# Patient Record
Sex: Female | Born: 1960
Health system: Southern US, Community
[De-identification: ages and names within clinical notes are randomized; demographics above are authoritative.]

## PROBLEM LIST (undated history)

## (undated) DIAGNOSIS — R350 Frequency of micturition: Secondary | ICD-10-CM

## (undated) DIAGNOSIS — Z9889 Other specified postprocedural states: Secondary | ICD-10-CM

## (undated) DIAGNOSIS — R112 Nausea with vomiting, unspecified: Secondary | ICD-10-CM

## (undated) DIAGNOSIS — G43909 Migraine, unspecified, not intractable, without status migrainosus: Secondary | ICD-10-CM

## (undated) DIAGNOSIS — N951 Menopausal and female climacteric states: Secondary | ICD-10-CM

## (undated) DIAGNOSIS — G4730 Sleep apnea, unspecified: Secondary | ICD-10-CM

## (undated) DIAGNOSIS — D649 Anemia, unspecified: Secondary | ICD-10-CM

## (undated) DIAGNOSIS — K8020 Calculus of gallbladder without cholecystitis without obstruction: Secondary | ICD-10-CM

## (undated) DIAGNOSIS — B019 Varicella without complication: Secondary | ICD-10-CM

## (undated) DIAGNOSIS — M1990 Unspecified osteoarthritis, unspecified site: Secondary | ICD-10-CM

## (undated) DIAGNOSIS — K219 Gastro-esophageal reflux disease without esophagitis: Secondary | ICD-10-CM

## (undated) DIAGNOSIS — R51 Headache: Secondary | ICD-10-CM

## (undated) DIAGNOSIS — T7840XA Allergy, unspecified, initial encounter: Secondary | ICD-10-CM

## (undated) DIAGNOSIS — E119 Type 2 diabetes mellitus without complications: Secondary | ICD-10-CM

## (undated) DIAGNOSIS — M797 Fibromyalgia: Secondary | ICD-10-CM

## (undated) DIAGNOSIS — Z7251 High risk heterosexual behavior: Secondary | ICD-10-CM

## (undated) DIAGNOSIS — G5600 Carpal tunnel syndrome, unspecified upper limb: Secondary | ICD-10-CM

## (undated) DIAGNOSIS — J3089 Other allergic rhinitis: Secondary | ICD-10-CM

## (undated) DIAGNOSIS — D219 Benign neoplasm of connective and other soft tissue, unspecified: Secondary | ICD-10-CM

## (undated) HISTORY — PX: TUBAL LIGATION: SHX77

## (undated) HISTORY — DX: Calculus of gallbladder without cholecystitis without obstruction: K80.20

## (undated) HISTORY — DX: High risk heterosexual behavior: Z72.51

## (undated) HISTORY — DX: Benign neoplasm of connective and other soft tissue, unspecified: D21.9

## (undated) HISTORY — DX: Menopausal and female climacteric states: N95.1

## (undated) HISTORY — DX: Carpal tunnel syndrome, unspecified upper limb: G56.00

## (undated) HISTORY — DX: Other allergic rhinitis: J30.89

## (undated) HISTORY — DX: Varicella without complication: B01.9

## (undated) HISTORY — DX: Allergy, unspecified, initial encounter: T78.40XA

## (undated) HISTORY — DX: Type 2 diabetes mellitus without complications: E11.9

## (undated) HISTORY — DX: Anemia, unspecified: D64.9

## (undated) HISTORY — DX: Fibromyalgia: M79.7

## (undated) HISTORY — PX: WISDOM TOOTH EXTRACTION: SHX21

## (undated) HISTORY — PX: LASER ABLATION CONDYLOMA CERVICAL / VULVAR: SUR819

---

## 1997-06-19 ENCOUNTER — Ambulatory Visit (HOSPITAL_COMMUNITY): Admission: RE | Admit: 1997-06-19 | Discharge: 1997-06-19 | Payer: Self-pay | Admitting: *Deleted

## 1997-09-19 ENCOUNTER — Emergency Department (HOSPITAL_COMMUNITY): Admission: EM | Admit: 1997-09-19 | Discharge: 1997-09-19 | Payer: Self-pay | Admitting: Emergency Medicine

## 1997-12-06 ENCOUNTER — Other Ambulatory Visit: Admission: RE | Admit: 1997-12-06 | Discharge: 1997-12-06 | Payer: Self-pay | Admitting: Obstetrics and Gynecology

## 1997-12-14 ENCOUNTER — Emergency Department (HOSPITAL_COMMUNITY): Admission: EM | Admit: 1997-12-14 | Discharge: 1997-12-14 | Payer: Self-pay | Admitting: Emergency Medicine

## 1998-01-22 ENCOUNTER — Emergency Department (HOSPITAL_COMMUNITY): Admission: EM | Admit: 1998-01-22 | Discharge: 1998-01-22 | Payer: Self-pay | Admitting: Emergency Medicine

## 1998-06-08 ENCOUNTER — Emergency Department (HOSPITAL_COMMUNITY): Admission: EM | Admit: 1998-06-08 | Discharge: 1998-06-08 | Payer: Self-pay

## 1998-09-27 ENCOUNTER — Ambulatory Visit (HOSPITAL_COMMUNITY): Admission: RE | Admit: 1998-09-27 | Discharge: 1998-09-27 | Payer: Self-pay | Admitting: *Deleted

## 1998-10-23 ENCOUNTER — Ambulatory Visit (HOSPITAL_COMMUNITY): Admission: RE | Admit: 1998-10-23 | Discharge: 1998-10-23 | Payer: Self-pay | Admitting: Obstetrics

## 1998-11-29 ENCOUNTER — Emergency Department (HOSPITAL_COMMUNITY): Admission: EM | Admit: 1998-11-29 | Discharge: 1998-11-29 | Payer: Self-pay | Admitting: Internal Medicine

## 1998-11-29 ENCOUNTER — Encounter: Payer: Self-pay | Admitting: Internal Medicine

## 1998-12-14 ENCOUNTER — Emergency Department (HOSPITAL_COMMUNITY): Admission: EM | Admit: 1998-12-14 | Discharge: 1998-12-14 | Payer: Self-pay | Admitting: *Deleted

## 1999-02-16 ENCOUNTER — Emergency Department (HOSPITAL_COMMUNITY): Admission: EM | Admit: 1999-02-16 | Discharge: 1999-02-16 | Payer: Self-pay | Admitting: Emergency Medicine

## 1999-02-16 ENCOUNTER — Encounter: Payer: Self-pay | Admitting: Emergency Medicine

## 1999-04-18 ENCOUNTER — Emergency Department (HOSPITAL_COMMUNITY): Admission: EM | Admit: 1999-04-18 | Discharge: 1999-04-18 | Payer: Self-pay | Admitting: Emergency Medicine

## 1999-05-18 ENCOUNTER — Emergency Department (HOSPITAL_COMMUNITY): Admission: EM | Admit: 1999-05-18 | Discharge: 1999-05-18 | Payer: Self-pay

## 1999-07-22 ENCOUNTER — Emergency Department (HOSPITAL_COMMUNITY): Admission: EM | Admit: 1999-07-22 | Discharge: 1999-07-23 | Payer: Self-pay | Admitting: Emergency Medicine

## 1999-07-30 ENCOUNTER — Other Ambulatory Visit: Admission: RE | Admit: 1999-07-30 | Discharge: 1999-07-30 | Payer: Self-pay | Admitting: Obstetrics

## 1999-11-09 ENCOUNTER — Emergency Department (HOSPITAL_COMMUNITY): Admission: EM | Admit: 1999-11-09 | Discharge: 1999-11-09 | Payer: Self-pay | Admitting: Emergency Medicine

## 1999-12-24 ENCOUNTER — Emergency Department (HOSPITAL_COMMUNITY): Admission: EM | Admit: 1999-12-24 | Discharge: 1999-12-24 | Payer: Self-pay | Admitting: Emergency Medicine

## 2000-01-17 ENCOUNTER — Ambulatory Visit (HOSPITAL_COMMUNITY): Admission: RE | Admit: 2000-01-17 | Discharge: 2000-01-17 | Payer: Self-pay | Admitting: Neurology

## 2000-01-17 ENCOUNTER — Encounter: Payer: Self-pay | Admitting: Neurology

## 2000-04-02 ENCOUNTER — Encounter: Payer: Self-pay | Admitting: Emergency Medicine

## 2000-04-02 ENCOUNTER — Emergency Department (HOSPITAL_COMMUNITY): Admission: EM | Admit: 2000-04-02 | Discharge: 2000-04-02 | Payer: Self-pay | Admitting: Emergency Medicine

## 2000-09-13 ENCOUNTER — Emergency Department (HOSPITAL_COMMUNITY): Admission: EM | Admit: 2000-09-13 | Discharge: 2000-09-13 | Payer: Self-pay | Admitting: Emergency Medicine

## 2000-09-14 ENCOUNTER — Encounter: Payer: Self-pay | Admitting: Emergency Medicine

## 2000-09-14 ENCOUNTER — Emergency Department (HOSPITAL_COMMUNITY): Admission: EM | Admit: 2000-09-14 | Discharge: 2000-09-14 | Payer: Self-pay | Admitting: Emergency Medicine

## 2000-11-30 ENCOUNTER — Encounter: Payer: Self-pay | Admitting: Internal Medicine

## 2000-11-30 ENCOUNTER — Encounter: Admission: RE | Admit: 2000-11-30 | Discharge: 2000-11-30 | Payer: Self-pay | Admitting: Internal Medicine

## 2001-06-09 ENCOUNTER — Encounter: Admission: RE | Admit: 2001-06-09 | Discharge: 2001-06-09 | Payer: Self-pay | Admitting: Internal Medicine

## 2001-06-09 ENCOUNTER — Encounter: Payer: Self-pay | Admitting: Internal Medicine

## 2001-07-07 ENCOUNTER — Encounter: Payer: Self-pay | Admitting: Orthopedic Surgery

## 2001-07-07 ENCOUNTER — Encounter: Admission: RE | Admit: 2001-07-07 | Discharge: 2001-07-07 | Payer: Self-pay | Admitting: Orthopedic Surgery

## 2001-07-21 ENCOUNTER — Encounter: Admission: RE | Admit: 2001-07-21 | Discharge: 2001-07-21 | Payer: Self-pay | Admitting: Orthopedic Surgery

## 2001-07-21 ENCOUNTER — Encounter: Payer: Self-pay | Admitting: Orthopedic Surgery

## 2001-12-16 ENCOUNTER — Encounter: Admission: RE | Admit: 2001-12-16 | Discharge: 2001-12-16 | Payer: Self-pay | Admitting: Internal Medicine

## 2001-12-16 ENCOUNTER — Encounter: Payer: Self-pay | Admitting: Internal Medicine

## 2002-02-13 ENCOUNTER — Other Ambulatory Visit: Admission: RE | Admit: 2002-02-13 | Discharge: 2002-02-13 | Payer: Self-pay | Admitting: Obstetrics and Gynecology

## 2002-02-25 ENCOUNTER — Emergency Department (HOSPITAL_COMMUNITY): Admission: EM | Admit: 2002-02-25 | Discharge: 2002-02-25 | Payer: Self-pay | Admitting: Emergency Medicine

## 2002-02-25 ENCOUNTER — Encounter: Payer: Self-pay | Admitting: Emergency Medicine

## 2002-09-21 ENCOUNTER — Ambulatory Visit (HOSPITAL_COMMUNITY): Admission: RE | Admit: 2002-09-21 | Discharge: 2002-09-21 | Payer: Self-pay | Admitting: Internal Medicine

## 2002-12-21 ENCOUNTER — Encounter: Payer: Self-pay | Admitting: Internal Medicine

## 2002-12-21 ENCOUNTER — Encounter: Admission: RE | Admit: 2002-12-21 | Discharge: 2002-12-21 | Payer: Self-pay | Admitting: Internal Medicine

## 2003-02-21 ENCOUNTER — Emergency Department (HOSPITAL_COMMUNITY): Admission: EM | Admit: 2003-02-21 | Discharge: 2003-02-21 | Payer: Self-pay | Admitting: Emergency Medicine

## 2003-12-24 ENCOUNTER — Ambulatory Visit (HOSPITAL_COMMUNITY): Admission: RE | Admit: 2003-12-24 | Discharge: 2003-12-24 | Payer: Self-pay | Admitting: Internal Medicine

## 2004-02-02 ENCOUNTER — Emergency Department (HOSPITAL_COMMUNITY): Admission: EM | Admit: 2004-02-02 | Discharge: 2004-02-02 | Payer: Self-pay | Admitting: Emergency Medicine

## 2004-02-05 ENCOUNTER — Encounter (INDEPENDENT_AMBULATORY_CARE_PROVIDER_SITE_OTHER): Payer: Self-pay | Admitting: Specialist

## 2004-02-05 ENCOUNTER — Ambulatory Visit (HOSPITAL_COMMUNITY): Admission: RE | Admit: 2004-02-05 | Discharge: 2004-02-05 | Payer: Self-pay | Admitting: Obstetrics

## 2005-05-04 ENCOUNTER — Ambulatory Visit (HOSPITAL_COMMUNITY): Admission: RE | Admit: 2005-05-04 | Discharge: 2005-05-04 | Payer: Self-pay | Admitting: Internal Medicine

## 2006-04-13 HISTORY — PX: ABDOMINAL HYSTERECTOMY: SHX81

## 2006-04-13 HISTORY — PX: LAPAROSCOPIC HYSTERECTOMY: SHX1926

## 2006-06-02 ENCOUNTER — Ambulatory Visit (HOSPITAL_COMMUNITY): Admission: RE | Admit: 2006-06-02 | Discharge: 2006-06-02 | Payer: Self-pay | Admitting: Obstetrics

## 2006-10-13 ENCOUNTER — Encounter (INDEPENDENT_AMBULATORY_CARE_PROVIDER_SITE_OTHER): Payer: Self-pay | Admitting: Obstetrics

## 2006-10-13 ENCOUNTER — Inpatient Hospital Stay (HOSPITAL_COMMUNITY): Admission: RE | Admit: 2006-10-13 | Discharge: 2006-10-15 | Payer: Self-pay | Admitting: Obstetrics

## 2006-11-01 ENCOUNTER — Ambulatory Visit (HOSPITAL_COMMUNITY): Admission: RE | Admit: 2006-11-01 | Discharge: 2006-11-01 | Payer: Self-pay | Admitting: Obstetrics

## 2006-11-03 ENCOUNTER — Ambulatory Visit (HOSPITAL_COMMUNITY): Admission: RE | Admit: 2006-11-03 | Discharge: 2006-11-03 | Payer: Self-pay | Admitting: Obstetrics

## 2007-07-04 ENCOUNTER — Emergency Department (HOSPITAL_COMMUNITY): Admission: EM | Admit: 2007-07-04 | Discharge: 2007-07-04 | Payer: Self-pay | Admitting: Emergency Medicine

## 2008-03-04 ENCOUNTER — Emergency Department: Payer: Self-pay | Admitting: Emergency Medicine

## 2008-04-24 ENCOUNTER — Ambulatory Visit (HOSPITAL_COMMUNITY): Admission: RE | Admit: 2008-04-24 | Discharge: 2008-04-24 | Payer: Self-pay | Admitting: Internal Medicine

## 2008-08-30 ENCOUNTER — Emergency Department (HOSPITAL_COMMUNITY): Admission: EM | Admit: 2008-08-30 | Discharge: 2008-08-30 | Payer: Self-pay | Admitting: Emergency Medicine

## 2009-02-10 ENCOUNTER — Encounter: Admission: RE | Admit: 2009-02-10 | Discharge: 2009-02-10 | Payer: Self-pay | Admitting: Internal Medicine

## 2009-04-25 ENCOUNTER — Ambulatory Visit (HOSPITAL_COMMUNITY): Admission: RE | Admit: 2009-04-25 | Discharge: 2009-04-25 | Payer: Self-pay | Admitting: Internal Medicine

## 2009-08-22 ENCOUNTER — Encounter: Admission: RE | Admit: 2009-08-22 | Discharge: 2009-08-22 | Payer: Self-pay | Admitting: Specialist

## 2009-10-16 ENCOUNTER — Emergency Department (HOSPITAL_COMMUNITY): Admission: EM | Admit: 2009-10-16 | Discharge: 2009-10-16 | Payer: Self-pay | Admitting: Emergency Medicine

## 2009-12-30 ENCOUNTER — Emergency Department (HOSPITAL_COMMUNITY): Admission: EM | Admit: 2009-12-30 | Discharge: 2009-12-30 | Payer: Self-pay | Admitting: Emergency Medicine

## 2010-05-03 ENCOUNTER — Encounter: Payer: Self-pay | Admitting: Internal Medicine

## 2010-05-04 ENCOUNTER — Encounter: Payer: Self-pay | Admitting: Obstetrics

## 2010-05-20 ENCOUNTER — Other Ambulatory Visit (HOSPITAL_COMMUNITY): Payer: Self-pay | Admitting: Obstetrics

## 2010-05-20 DIAGNOSIS — Z1231 Encounter for screening mammogram for malignant neoplasm of breast: Secondary | ICD-10-CM

## 2010-05-29 ENCOUNTER — Ambulatory Visit (HOSPITAL_COMMUNITY)
Admission: RE | Admit: 2010-05-29 | Discharge: 2010-05-29 | Disposition: A | Discharge status: Home / Self Care | Payer: Federal, State, Local not specified - PPO | Source: Ambulatory Visit | Attending: Obstetrics | Admitting: Obstetrics

## 2010-05-29 DIAGNOSIS — Z1231 Encounter for screening mammogram for malignant neoplasm of breast: Secondary | ICD-10-CM | POA: Insufficient documentation

## 2010-06-02 ENCOUNTER — Other Ambulatory Visit: Payer: Self-pay | Admitting: Obstetrics

## 2010-06-02 DIAGNOSIS — R928 Other abnormal and inconclusive findings on diagnostic imaging of breast: Secondary | ICD-10-CM

## 2010-06-06 ENCOUNTER — Ambulatory Visit
Admission: RE | Admit: 2010-06-06 | Discharge: 2010-06-06 | Disposition: A | Discharge status: Home / Self Care | Payer: Federal, State, Local not specified - PPO | Source: Ambulatory Visit | Attending: Obstetrics | Admitting: Obstetrics

## 2010-06-06 DIAGNOSIS — R928 Other abnormal and inconclusive findings on diagnostic imaging of breast: Secondary | ICD-10-CM

## 2010-06-29 LAB — POCT I-STAT, CHEM 8
BUN: 9 mg/dL (ref 6–23)
Calcium, Ion: 1.07 mmol/L — ABNORMAL LOW (ref 1.12–1.32)
Chloride: 104 mEq/L (ref 96–112)
Glucose, Bld: 109 mg/dL — ABNORMAL HIGH (ref 70–99)
Hemoglobin: 14.6 g/dL (ref 12.0–15.0)
Sodium: 137 mEq/L (ref 135–145)

## 2010-07-22 LAB — URINALYSIS, ROUTINE W REFLEX MICROSCOPIC
Glucose, UA: NEGATIVE mg/dL
Ketones, ur: NEGATIVE mg/dL
Nitrite: NEGATIVE
Urobilinogen, UA: 1 mg/dL (ref 0.0–1.0)

## 2010-07-22 LAB — DIFFERENTIAL
Lymphocytes Relative: 33 % (ref 12–46)
Lymphs Abs: 1.6 10*3/uL (ref 0.7–4.0)
Neutro Abs: 2.9 10*3/uL (ref 1.7–7.7)
Neutrophils Relative %: 59 % (ref 43–77)

## 2010-07-22 LAB — COMPREHENSIVE METABOLIC PANEL
ALT: 13 U/L (ref 0–35)
AST: 16 U/L (ref 0–37)
Alkaline Phosphatase: 41 U/L (ref 39–117)
CO2: 23 mEq/L (ref 19–32)
Calcium: 8.5 mg/dL (ref 8.4–10.5)
Potassium: 4 mEq/L (ref 3.5–5.1)
Sodium: 134 mEq/L — ABNORMAL LOW (ref 135–145)
Total Bilirubin: 0.5 mg/dL (ref 0.3–1.2)

## 2010-07-22 LAB — CBC
HCT: 37.7 % (ref 36.0–46.0)
Hemoglobin: 12.6 g/dL (ref 12.0–15.0)
MCV: 81.2 fL (ref 78.0–100.0)

## 2010-07-22 LAB — URINE MICROSCOPIC-ADD ON

## 2010-07-22 LAB — WET PREP, GENITAL: Yeast Wet Prep HPF POC: NONE SEEN

## 2010-07-22 LAB — LIPASE, BLOOD: Lipase: 24 U/L (ref 11–59)

## 2010-08-26 NOTE — Op Note (Signed)
Natalie Vazquez, Natalie Vazquez                 ACCOUNT NO.:  [id number]   MEDICAL RECORD NO.:  [id number]          PATIENT TYPE:  INP   LOCATION:  9304                          FACILITY:  [facility]   PHYSICIAN:  Kathreen Cosier, M.D.DATE OF BIRTH:  April 22, 1960   DATE OF PROCEDURE:  10/13/2006  DATE OF DISCHARGE:                               OPERATIVE REPORT   PREOPERATIVE DIAGNOSES:  Intramural myoma, adenomyosis.   POSTOPERATIVE DIAGNOSES:  Adenomyosis.   SURGEON:  Kathreen Cosier, M.D.   FIRST ASSISTANT:  Charles A. Clearance Coots, M.D.   The patient was placed on the operating table in supine position,  general anesthesia administered, abdomen prepped and draped, bladder  emptied with Foley catheter.  Transverse suprapubic incision made,  carried down to the rectus fascia, fascia cleaned and incised the length  of the incision.  Rectus muscles were retracted laterally.  Peritoneum  incised longitudinally.  Uterus was enlarged and boggy.  The ovaries  were normal.  She had a previous tubal ligation.  The right round  ligament was grasped with a Kelly clamp, cut, suture ligated with #1  chromic.  Procedure was done in a similar fashion on the other side.  The Metzenbaum scissors were used to dissect the bladder off the uterus  with sharp dissection, then the right utero-ovarian ligament grasped,  cut, suture ligated with #1 chromic.  Procedure done in similar fashion  on the other side.  Uterine vessels were skeletonized bilaterally,  double clamped with Heaney clamps, cut, suture ligated times two on the  right.  Procedure done in a similar fashion on the other side.  Uterosacral and cardinal ligaments grasped on the right, cut, suture  ligated with #1 chromic.  Procedure done in similar fashion on the other  side.  Using the Mayo scissors, the uterus was removed at the  cervicovaginal junction.   Modified Richardson sutures were placed in the angle of the vagina and  then the vaginal vault  run with interlocking suture of #1 chromic.  Hemostasis was satisfactory.  The operative site was reperitonealized  with 2-0 chromic. The remnant of the left tube was grasped with a Kelly,  cut, suture ligated with #1 chromic.  Procedure done in similar fashion  on the other side.  Hemostasis satisfactory.  Lap and sponge counts  correct.  Blood loss 150 mL.   Abdomen closed in layers.  Peritoneum continuous suture of 0 chromic,  fascia continuous suture of 0 Dexon, skin closed with subcuticular  stitch of 4-0 Monocryl.   The patient tolerated the procedure well, taken to recovery room in good  condition.           ______________________________  Kathreen Cosier, M.D.     BAM/MEDQ  D:  10/13/2006  T:  10/13/2006  Job:  [id number]

## 2010-08-29 NOTE — Op Note (Signed)
   [name], [name]                               ACCOUNT NO.:  [id number]   MEDICAL RECORD NO.:  [id number]                   PATIENT TYPE:  AMB   LOCATION:  ENDO                                 FACILITY:  [facility]   PHYSICIAN:  Georgiana Spinner, M.D.                 DATE OF BIRTH:  May 19, 1960   DATE OF PROCEDURE:  09/21/2002  DATE OF DISCHARGE:                                 OPERATIVE REPORT   PROCEDURE:  Colonoscopy.   ENDOSCOPIST:  Georgiana Spinner, M.D.   INDICATIONS:  Rectal bleeding.   ANESTHESIA:  Demerol 50 mg, Versed 2.5 mg.   DESCRIPTION OF PROCEDURE:  With the patient mildly sedated in the left  lateral decubitus position, the Olympus videoscopic colonoscope was inserted  in the rectum and passed under direct vision to the cecum identified by  ileocecal valve and appendiceal orifice both of which were photographed.  From this point, the colonoscope was slowly withdrawn taken circumferential  views of the entire colonic mucosa stopping only in the rectum which  appeared normal on direct view and retroflexed view.  The endoscope was  straightened and withdrawn.  The patient's vital signs and pulse oximeter  remained stable.  The patient tolerated the procedure well without apparent  complications.   FINDINGS:  Unremarkable colonoscopic examination.   PLAN:  See endoscopy note for further details.                                               Georgiana Spinner, M.D.    GMO/MEDQ  D:  09/21/2002  T:  09/21/2002  Job:  [id number]   cc:   Olene Craven, M.D.  [address]  [address]  [place]  [place] [address]  Fax: [phone]

## 2010-08-29 NOTE — Op Note (Signed)
   NAMEQUORRA, ROSENE                               ACCOUNT NO.:  [id number]   MEDICAL RECORD NO.:  [id number]                   PATIENT TYPE:  AMB   LOCATION:  ENDO                                 FACILITY:  [facility]   PHYSICIAN:  Georgiana Spinner, M.D.                 DATE OF BIRTH:  1960-10-20   DATE OF PROCEDURE:  09/21/2002  DATE OF DISCHARGE:                                 OPERATIVE REPORT   PROCEDURE PERFORMED:  Upper endoscopy with Savary dilation.   ENDOSCOPIST:  Georgiana Spinner, M.D.   ANESTHESIA:  Demerol 50 mg, Versed 7.5 mg.   DESCRIPTION OF PROCEDURE:  With the patient mildly sedated in the left  lateral decubitus position, the Olympus video endoscope was inserted in the  mouth and passed under direct vision through the esophagus which appeared  normal into the stomach.  The fundus, body, antrum, duodenal bulb and second  portion of the duodenum all appeared normal.  From this point, the endoscope  was slowly withdrawn taking circumferential views of the entire duodenal  mucosa until the endoscope was pulled back into the stomach and placed on  retroflexion to view the stomach from below.  The endoscope was then  straightened and a guidewire was passed.  The endoscope was withdrawn and  over the guidewire was passed the 16 and 18 Savary dilators.  There was  minimal resistance with the latter.  The guidewire was removed.  The  endoscope was reinserted and the esophagus and stomach appeared normal other  than some small bleeding noted at the dilation site.  The endoscope was  withdrawn.  The patient's vital signs and pulse oximeter remained stable.  The patient tolerated the procedure well without apparent complications.   FINDINGS:  Unremarkable examination with esophagus dilated to 16 and 18  Savary dilators.   PLAN:  Will await clinical response.  Will have the patient follow up with  me as an outpatient.  Proceed to colonoscopy as planned.                     Georgiana Spinner, M.D.    GMO/MEDQ  D:  09/21/2002  T:  09/21/2002  Job:  [id number]   cc:   Olene Craven, M.D.  [address]  [address]  [place]  [place] [address]  Fax: [phone]

## 2010-08-29 NOTE — Discharge Summary (Signed)
[name], [name]                 ACCOUNT NO.:  [id number]   MEDICAL RECORD NO.:  [id number]          PATIENT TYPE:  INP   LOCATION:  9304                          [facility]ACILITY[facility]   PHYSICIAN:  Kathreen Cosier, M.D.DATE OF BIRTH:  05-19-1960   DATE OF ADMISSION:  10/13/2006  DATE OF DISCHARGE:  10/15/2006                               DISCHARGE SUMMARY   Patient is a 50 year old gravida 5, para 5 with history of dysfunctional  bleeding who had NovaSure ablation in 2005.  She still had heavy periods  and severe pain, and was admitted for TAH.  She underwent a TAH on 07/02  and did well postoperatively.  Her hemoglobin was 11.1, preoperatively  11.3, white count 6.3, 10.2.  PT/PTT normal.  Sodium 138, potassium 4,  chloride 107, glucose 107.  Urine negative.  She was discharged home on  the second postoperative day, ambulatory on a regular diet, on Tylox, to  see me in 4 weeks.   DISCHARGE DIAGNOSIS:  Status post total abdominal hysterectomy because  of chronic pelvic pain.           ______________________________  Kathreen Cosier, M.D.     BAM/MEDQ  D:  12/16/2006  T:  12/16/2006  Job:  [id number]

## 2010-08-29 NOTE — Op Note (Signed)
NAME:  Natalie Vazquez, Natalie Vazquez                   ACCOUNT NO.:  [id number]   MEDICAL RECORD NO.:  [id number]          PATIENT TYPE:  AMB   LOCATION:  SDC                           [facility]ILITY:  [facility]   PHYSICIAN:  Kathreen Cosier, M.D.DATE OF BIRTH:  1961-01-16   DATE OF PROCEDURE:  02/05/2004  DATE OF DISCHARGE:                                 OPERATIVE REPORT   PREOPERATIVE DIAGNOSES:  Dysfunctional uterine bleeding.   POSTOPERATIVE DIAGNOSES:  Dysfunctional uterine bleeding.   PROCEDURE:  Diagnostic hysteroscopy, sharp curettage and Novasure ablation  in the endometrial cavity.   Under general anesthesia, the patient in the lithotomy position, perineum  and vagina prepped and draped, bladder emptied with a straight catheter.  Bimanual exam revealed the uterus to be 12 week size enlarged with myoma.  A  speculum placed in the vagina, anterior lip of the cervix grasped with a  tenaculum. The endocervix was curetted and a small amount of tissue  obtained. The endometrial cavity sounded to 11 cm and then the cervical  length was 5 cm so the cavity length was 6 cm.  The cervix dilated to a #27  Shawnie Pons, diagnostic hysteroscope inserted, cavity was normal.  Sharp curettage  was performed, a moderate amount of tissue was obtained.  An ablation device  was inserted and the cavity integrity established. The width of the cavity  was 4.5 cm.  The ablation was done at 149 watts for one minute and seven  seconds.  The __________ was removed and the hysteroscopy repeated and the  endometrial cavity totally ablated.  The hysteroscopy pump was set at 70  mmHg and fluid deficit was 40 mL.  The patient tolerated the procedure well  and was taken to the recovery room in good condition.      BAM/MEDQ  D:  02/05/2004  T:  02/05/2004  Job:  [id number]

## 2010-09-11 ENCOUNTER — Emergency Department (HOSPITAL_COMMUNITY)
Admission: EM | Admit: 2010-09-11 | Discharge: 2010-09-12 | Disposition: A | Discharge status: Home / Self Care | Payer: Federal, State, Local not specified - PPO | Attending: Emergency Medicine | Admitting: Emergency Medicine

## 2010-09-11 DIAGNOSIS — M79609 Pain in unspecified limb: Secondary | ICD-10-CM | POA: Insufficient documentation

## 2010-09-11 DIAGNOSIS — G589 Mononeuropathy, unspecified: Secondary | ICD-10-CM | POA: Insufficient documentation

## 2010-09-12 LAB — GLUCOSE, CAPILLARY: Glucose-Capillary: 97 mg/dL (ref 70–99)

## 2010-10-29 ENCOUNTER — Other Ambulatory Visit: Payer: Self-pay | Admitting: Internal Medicine

## 2010-10-29 DIAGNOSIS — N63 Unspecified lump in unspecified breast: Secondary | ICD-10-CM

## 2010-11-13 ENCOUNTER — Ambulatory Visit
Admission: RE | Admit: 2010-11-13 | Discharge: 2010-11-13 | Disposition: A | Discharge status: Home / Self Care | Payer: Federal, State, Local not specified - PPO | Source: Ambulatory Visit | Attending: Internal Medicine | Admitting: Internal Medicine

## 2010-11-13 DIAGNOSIS — N63 Unspecified lump in unspecified breast: Secondary | ICD-10-CM

## 2011-01-27 LAB — CBC
HCT: 34.1 — ABNORMAL LOW
Hemoglobin: 11.1 — ABNORMAL LOW
MCHC: 32.5
MCV: 80.5
RBC: 4.23
WBC: 10.2

## 2011-01-28 LAB — COMPREHENSIVE METABOLIC PANEL
Albumin: 3.7
BUN: 6
Calcium: 8.9
Chloride: 107
Creatinine, Ser: 0.71
Total Bilirubin: 0.5
Total Protein: 7.2

## 2011-01-28 LAB — CBC
HCT: 41.3
MCHC: 32.1
MCV: 82.2
Platelets: 234
RDW: 14.8 — ABNORMAL HIGH

## 2011-01-28 LAB — PROTIME-INR: Prothrombin Time: 13.4

## 2011-01-28 LAB — DIFFERENTIAL
Basophils Absolute: 0.1
Eosinophils Absolute: 0.1
Eosinophils Relative: 2

## 2011-03-23 ENCOUNTER — Emergency Department (HOSPITAL_COMMUNITY)
Admission: EM | Admit: 2011-03-23 | Discharge: 2011-03-24 | Disposition: A | Discharge status: Home / Self Care | Payer: Federal, State, Local not specified - PPO | Attending: Emergency Medicine | Admitting: Emergency Medicine

## 2011-03-23 ENCOUNTER — Encounter (HOSPITAL_COMMUNITY): Payer: Self-pay | Admitting: *Deleted

## 2011-03-23 DIAGNOSIS — K8050 Calculus of bile duct without cholangitis or cholecystitis without obstruction: Secondary | ICD-10-CM

## 2011-03-23 DIAGNOSIS — K8020 Calculus of gallbladder without cholecystitis without obstruction: Secondary | ICD-10-CM | POA: Insufficient documentation

## 2011-03-23 DIAGNOSIS — R10819 Abdominal tenderness, unspecified site: Secondary | ICD-10-CM | POA: Insufficient documentation

## 2011-03-23 DIAGNOSIS — R1011 Right upper quadrant pain: Secondary | ICD-10-CM | POA: Insufficient documentation

## 2011-03-23 HISTORY — DX: Unspecified osteoarthritis, unspecified site: M19.90

## 2011-03-24 ENCOUNTER — Emergency Department (HOSPITAL_COMMUNITY): Payer: Federal, State, Local not specified - PPO

## 2011-03-24 LAB — URINALYSIS, ROUTINE W REFLEX MICROSCOPIC
Ketones, ur: NEGATIVE mg/dL
Leukocytes, UA: NEGATIVE
Nitrite: NEGATIVE
Protein, ur: 30 mg/dL — AB
Urobilinogen, UA: 1 mg/dL (ref 0.0–1.0)
pH: 7 (ref 5.0–8.0)

## 2011-03-24 LAB — DIFFERENTIAL
Basophils Relative: 0 % (ref 0–1)
Eosinophils Absolute: 0.1 10*3/uL (ref 0.0–0.7)
Monocytes Absolute: 0.4 10*3/uL (ref 0.1–1.0)
Monocytes Relative: 6 % (ref 3–12)
Neutro Abs: 2.5 10*3/uL (ref 1.7–7.7)

## 2011-03-24 LAB — COMPREHENSIVE METABOLIC PANEL
Albumin: 3.6 g/dL (ref 3.5–5.2)
BUN: 8 mg/dL (ref 6–23)
Chloride: 102 mEq/L (ref 96–112)
Creatinine, Ser: 0.71 mg/dL (ref 0.50–1.10)
GFR calc Af Amer: >90 mL/min (ref >90)
GFR calc non Af Amer: >90 mL/min (ref >90)
Glucose, Bld: 112 mg/dL — ABNORMAL HIGH (ref 70–99)
Total Bilirubin: 0.2 mg/dL — ABNORMAL LOW (ref 0.3–1.2)

## 2011-03-24 LAB — LIPASE, BLOOD: Lipase: 44 U/L (ref 11–59)

## 2011-03-24 LAB — CBC
HCT: 39.1 % (ref 36.0–46.0)
Hemoglobin: 13.1 g/dL (ref 12.0–15.0)
MCH: 26.3 pg (ref 26.0–34.0)
MCHC: 33.5 g/dL (ref 30.0–36.0)

## 2011-03-24 MED ORDER — HYDROCODONE-ACETAMINOPHEN 5-500 MG PO TABS: 1.0000 | ORAL_TABLET | Freq: Four times a day (QID) | ORAL | Status: AC | PRN

## 2011-03-24 MED ORDER — ONDANSETRON HCL 4 MG/2ML IJ SOLN
4.0000 mg | Freq: Once | INTRAMUSCULAR | Status: AC
  Administered 2011-03-24: 4 mg via INTRAVENOUS
  Filled 2011-03-24: qty 2

## 2011-03-24 MED ORDER — SODIUM CHLORIDE 0.9 % IV SOLN
Freq: Once | INTRAVENOUS | Status: AC
  Administered 2011-03-24: 01:00:00 via INTRAVENOUS

## 2011-03-24 MED ORDER — MORPHINE SULFATE 4 MG/ML IJ SOLN: 4.0000 mg | Freq: Once | INTRAMUSCULAR | Status: DC

## 2011-03-24 MED ORDER — ONDANSETRON HCL 4 MG PO TABS: 4.0000 mg | ORAL_TABLET | Freq: Four times a day (QID) | ORAL | Status: AC

## 2011-03-24 MED ORDER — MORPHINE SULFATE 2 MG/ML IJ SOLN
INTRAMUSCULAR | Status: AC
  Administered 2011-03-24: 4 mg via INTRAMUSCULAR
  Filled 2011-03-24: qty 2

## 2011-03-24 NOTE — ED Notes (Signed)
Pt out of room in [place]

## 2011-03-24 NOTE — ED Provider Notes (Addendum)
History     CSN: [id number] Arrival date & time: 03/23/2011 11:13 PM   First MD Initiated Contact with Patient 03/24/11 0014      Chief Complaint  Patient presents with  . Flank Pain    pt c/o right side flank and back pain that began around 10pm. pt denies hx of kidney stones. pt c/o nausea denies vomiting.     (Consider location/radiation/quality/duration/timing/severity/associated sxs/prior treatment) HPI Comments: Approximately 2 hours after eating a ham and cheese sandwich developed right upper quadrant pain radiating to the right flank with some nausea.  No vomiting.  Has never had any pain like this before has no history of gallbladder disease has new history of kidney stones.  Denies dysuria constipation or  The history is provided by the patient.    Past Medical History  Diagnosis Date  . Arthritis     Past Surgical History  Procedure Date  . Tubal ligation   . Abdominal hysterectomy     partial    History reviewed. No pertinent family history.  History  Substance Use Topics  . Smoking status: Never Smoker   . Smokeless tobacco: Not on file  . Alcohol Use: Yes    OB History    Grav Para Term Preterm Abortions TAB SAB Ect Mult Living                  Review of Systems  Constitutional: Negative.   HENT: Negative.   Eyes: Negative.   Respiratory: Negative.   Cardiovascular: Negative.   Gastrointestinal: Positive for abdominal pain. Negative for nausea, vomiting, diarrhea, constipation and abdominal distention.  Genitourinary: Negative for dysuria, urgency and frequency.  Musculoskeletal: Negative.   Skin: Negative.   Neurological: Negative.   Hematological: Negative.   Psychiatric/Behavioral: Negative.     Allergies  Iodine; Latex; and Shellfish allergy  Home Medications   Current Outpatient Rx  Name Route Sig Dispense Refill  . CETIRIZINE HCL 10 MG PO TABS Oral Take 10 mg by mouth daily.      Marland Kitchen ESOMEPRAZOLE MAGNESIUM 40 MG PO CPDR Oral  Take 40 mg by mouth daily before breakfast.      . HYDROCODONE-ACETAMINOPHEN 5-500 MG PO TABS Oral Take 1-2 tablets by mouth every 6 (six) hours as needed for pain (as needed for pain ). 10 tablet 0  . ONDANSETRON HCL 4 MG PO TABS Oral Take 1 tablet (4 mg total) by mouth every 6 (six) hours. 12 tablet 0    BP 136/87  Pulse 58  Temp(Src) 97.8 F (36.6 C) (Oral)  Resp 20  SpO2 99%  Physical Exam  Constitutional: She appears well-developed.  HENT:  Head: Normocephalic.  Eyes: Pupils are equal, round, and reactive to light.  Neck: Normal range of motion.  Cardiovascular: Normal rate.   Pulmonary/Chest: Effort normal.  Abdominal: She exhibits no distension. There is tenderness. There is no rebound and no guarding.  Musculoskeletal: Normal range of motion.    ED Course  Procedures (including critical care time)  Labs Reviewed  DIFFERENTIAL - Abnormal; Notable for the following:    Neutrophils Relative 33 (*)    Lymphocytes Relative 59 (*)    Lymphs Abs 4.3 (*)    All other components within normal limits  COMPREHENSIVE METABOLIC PANEL - Abnormal; Notable for the following:    Glucose, Bld 112 (*)    AST 39 (*)    Total Bilirubin 0.2 (*)    All other components within normal limits  URINALYSIS, ROUTINE W  REFLEX MICROSCOPIC - Abnormal; Notable for the following:    Color, Urine STRAW (*)    Protein, ur 30 (*)    All other components within normal limits  URINE MICROSCOPIC-ADD ON - Abnormal; Notable for the following:    Bacteria, UA MANY (*)    All other components within normal limits  CBC  LIPASE, BLOOD   US Abdomen Complete  03/24/2011  *RADIOLOGY REPORT*  Clinical Data:  Right upper quadrant pain  COMPLETE ABDOMINAL ULTRASOUND  Comparison:  CT 08/30/2008 and ultrasound 11/03/2006  Findings:  Gallbladder:  9 mm mobile stone in the gallbladder.  No gallbladder wall thickening or pericholecystic edema.  Murphy's sign was negative.  Normal caliber,  Common bile duct:  Normal  caliber, measuring about 4 mm diameter.  Liver:  No focal lesion identified.  Within normal limits in parenchymal echogenicity.  IVC:  Appears normal.  Pancreas:  Limited visualization of the pancreas due to overlying bowel gas.  Spleen:  Spleen length measures 6.7 cm.  Normal homogeneous parenchymal echotexture.  Right Kidney:  Right kidney measures 12.4 cm length.  No hydronephrosis.  Left Kidney:  Left kidney measures 12.2 cm length.  No hydronephrosis.  Abdominal aorta:  Normal caliber.  IMPRESSION: Cholelithiasis with mobile gallstone.  Original Report Authenticated By: Marlon Pel, M.D.     1. Biliary colic       MDM  Kidney stone verses gall bladder disease         Arman Filter, NP 03/24/11 7829  Arman Filter, NP 03/24/11 0518  Arman Filter, NP 03/25/11 0425

## 2011-03-24 NOTE — ED Provider Notes (Signed)
Medical screening examination/treatment/procedure(s) were performed by non-physician practitioner and as supervising physician I was immediately available for consultation/collaboration.   Lyanne Co, MD 03/24/11 2230

## 2011-03-25 NOTE — ED Provider Notes (Signed)
Medical screening examination/treatment/procedure(s) were performed by non-physician practitioner and as supervising physician I was immediately available for consultation/collaboration.   Lyanne Co, MD 03/25/11 [phone]

## 2011-04-15 ENCOUNTER — Ambulatory Visit (INDEPENDENT_AMBULATORY_CARE_PROVIDER_SITE_OTHER): Payer: Self-pay | Admitting: General Surgery

## 2011-04-16 ENCOUNTER — Encounter (INDEPENDENT_AMBULATORY_CARE_PROVIDER_SITE_OTHER): Payer: Self-pay | Admitting: General Surgery

## 2011-04-16 ENCOUNTER — Ambulatory Visit (INDEPENDENT_AMBULATORY_CARE_PROVIDER_SITE_OTHER): Payer: Federal, State, Local not specified - PPO | Admitting: General Surgery

## 2011-04-16 VITALS — BP 140/87 | HR 70 | Temp 97.0°F | Resp 18 | Ht 67.0 in | Wt 233.6 lb

## 2011-04-16 DIAGNOSIS — K8020 Calculus of gallbladder without cholecystitis without obstruction: Secondary | ICD-10-CM

## 2011-04-16 NOTE — Progress Notes (Signed)
Addended by: Liz Malady on: 04/16/2011 03:53 PM   Modules accepted: Orders

## 2011-04-16 NOTE — Progress Notes (Signed)
Patient ID: Natalie Vazquez, female   DOB: 09-27-1960, 51 y.o.   MRN: [id number]  Chief Complaint  Patient presents with  . Abdominal Pain    Gallstone    HPI Natalie Vazquez is a 51 y.o. female.   HPIPatient has been having episodic right upper quadrant abdominal pain that extends around to her back. She has not noticed any association with eating. She was seen in the emergency department on December 11. At this time ultrasound showed multiple mobile gallstones. She did not have significant elevation of liver function tests. She did not have acute cholecystitis. Since that time, she has been watching what she eats. However, she has been having attacks every few days. She also gets some back spasms associated with these attacks. Dr. Ethelene Browns referred her for consultation.  Past Medical History  Diagnosis Date  . Arthritis     Past Surgical History  Procedure Date  . Tubal ligation   . Abdominal hysterectomy     partial  Esophageal dilatation for stricture  No family history on file.  Social History History  Substance Use Topics  . Smoking status: Never Smoker   . Smokeless tobacco: Not on file  . Alcohol Use: Yes    Allergies  Allergen Reactions  . Iodine   . Latex   . Shellfish Allergy     Current Outpatient Prescriptions  Medication Sig Dispense Refill  . cetirizine (ZYRTEC) 10 MG tablet Take 10 mg by mouth daily.        Marland Kitchen esomeprazole (NEXIUM) 40 MG capsule Take 40 mg by mouth daily before breakfast.        . ZONISAMIDE PO Take by mouth.          Review of Systems Review of Systems  Constitutional: Negative for fever, chills and unexpected weight change.  HENT: Negative for hearing loss, congestion, sore throat, trouble swallowing and voice change.   Eyes: Negative for visual disturbance.  Respiratory: Negative for cough and wheezing.   Cardiovascular: Negative for chest pain, palpitations and leg swelling.  Gastrointestinal: Positive for abdominal pain. Negative  for nausea, vomiting, diarrhea, constipation, blood in stool, abdominal distention and anal bleeding.       See history of present illness   Genitourinary: Negative for hematuria, vaginal bleeding and difficulty urinating.  Musculoskeletal: Negative for arthralgias.  Skin: Negative for rash and wound.  Neurological: Negative for seizures, syncope and headaches.  Hematological: Negative for adenopathy. Does not bruise/bleed easily.  Psychiatric/Behavioral: Negative for confusion.    Blood pressure 140/87, pulse 70, temperature 97 F (36.1 C), temperature source Temporal, resp. rate 18, height 5\' 7"  (1.702 m), weight 233 lb 9.6 oz (105.96 kg).  Physical Exam Physical Exam  Constitutional: She is oriented to person, place, and time. She appears well-developed and well-nourished.  HENT:  Head: Normocephalic and atraumatic.  Eyes: Conjunctivae and EOM are normal. Pupils are equal, round, and reactive to light.  Neck: Normal range of motion. Neck supple. No tracheal deviation present.  Cardiovascular: Normal rate, regular rhythm and normal heart sounds.   Pulmonary/Chest: Effort normal. No respiratory distress. She has no wheezes. She has no rales.  Abdominal: Soft. Bowel sounds are normal. She exhibits no distension. There is tenderness. There is no rebound and no guarding.       Tenderness is intermittent and very mild in the right upper quadrant  Musculoskeletal: Normal range of motion.  Neurological: She is alert and oriented to person, place, and time.  Skin: Skin is  warm and dry. No erythema.    Data Reviewed U/S and labs  Assessment    Symptomatic cholelithiasis    Plan    I have offered laparoscopic cholecystectomy with cholangiogram. I discussed the procedure in detail.  The patient was given Agricultural engineer.  We discussed the risks and benefits of a laparoscopic cholecystectomy and possible cholangiogram including, but not limited to bleeding, infection, injury to  surrounding structures such as the intestine or liver, bile leak, retained gallstones, need to convert to an open procedure, prolonged diarrhea, blood clots such as  DVT, common bile duct injury, anesthesia risks, and possible need for additional procedures.  The likelihood of improvement in symptoms and return to the patient's normal status is good. We discussed the typical post-operative recovery course.        Devaunte Gasparini E 04/16/2011, 3:44 PM

## 2011-04-20 ENCOUNTER — Other Ambulatory Visit: Payer: Self-pay | Admitting: Obstetrics

## 2011-04-20 ENCOUNTER — Encounter (INDEPENDENT_AMBULATORY_CARE_PROVIDER_SITE_OTHER): Payer: Self-pay | Admitting: General Surgery

## 2011-04-20 DIAGNOSIS — Z1231 Encounter for screening mammogram for malignant neoplasm of breast: Secondary | ICD-10-CM

## 2011-05-07 ENCOUNTER — Encounter (HOSPITAL_COMMUNITY): Payer: Self-pay | Admitting: Pharmacy Technician

## 2011-05-12 ENCOUNTER — Encounter (HOSPITAL_COMMUNITY)
Admission: RE | Admit: 2011-05-12 | Discharge: 2011-05-12 | Disposition: A | Discharge status: Home / Self Care | Payer: Federal, State, Local not specified - PPO | Source: Ambulatory Visit | Attending: General Surgery | Admitting: General Surgery

## 2011-05-12 ENCOUNTER — Encounter (HOSPITAL_COMMUNITY): Payer: Self-pay

## 2011-05-12 ENCOUNTER — Other Ambulatory Visit (HOSPITAL_COMMUNITY): Payer: Self-pay | Admitting: *Deleted

## 2011-05-12 HISTORY — DX: Headache: R51

## 2011-05-12 HISTORY — DX: Gastro-esophageal reflux disease without esophagitis: K21.9

## 2011-05-12 HISTORY — DX: Other specified postprocedural states: R11.2

## 2011-05-12 HISTORY — DX: Frequency of micturition: R35.0

## 2011-05-12 HISTORY — DX: Other specified postprocedural states: Z98.890

## 2011-05-12 HISTORY — DX: Nausea with vomiting, unspecified: R11.2

## 2011-05-12 LAB — CBC
HCT: 38.1 % (ref 36.0–46.0)
Hemoglobin: 12.6 g/dL (ref 12.0–15.0)
MCV: 79.2 fL (ref 78.0–100.0)
Platelets: 226 10*3/uL (ref 150–400)
RBC: 4.81 MIL/uL (ref 3.87–5.11)
WBC: 7.4 10*3/uL (ref 4.0–10.5)

## 2011-05-12 NOTE — Pre-Procedure Instructions (Signed)
20 Natalie Vazquez  05/12/2011   Your procedure is scheduled on: 05-19-2011   @10 :00 AM  Report to [facility] at 8:00AM.  Call this number if you have problems the morning of surgery: [phone]   Remember:   Do not eat food:After Midnight.  May have clear liquids: up to 4 Hours before arrival.  Clear liquids include soda, tea, black coffee, apple or grape juice, broth.until 4:00 AM  Take these medicines the morning of surgery with A SIP OF WATER:zyrtec, flexeril if needed, nexium,zonegran if needed   Do not wear jewelry, make-up or nail polish.  Do not wear lotions, powders, or perfumes. You may wear deodorant.  Do not shave 48 hours prior to surgery.  Do not bring valuables to the hospital.  Contacts, dentures or bridgework may not be worn into surgery.  Leave suitcase in the car. After surgery it may be brought to your room.    Patients discharged the day of surgery will not be allowed to drive home.  Name and phone number of your driver ALVIN  Special Instructions: CHG Shower Use Special Wash: 1/2 bottle night before surgery and 1/2 bottle morning of surgery.   Please read over the following fact sheets that you were given: Pain Booklet, Coughing and Deep Breathing, MRSA Information and Surgical Site Infection Prevention

## 2011-05-12 NOTE — Pre-Procedure Instructions (Signed)
20 STARLYN DROGE  05/12/2011   Your procedure is scheduled on: 05-19-2011 @  12noon  Report to [facility] at 10:00 AM.  Call this number if you have problems the morning of surgery: [phone]   Remember:   Do not eat food:After Midnight.  May have clear liquids: up to 4 Hours before arrival.  Clear liquids include soda, tea, black coffee, apple or grape juice, broth. Until 6:00 AM  Take these medicines the morning of surgery with A SIP OF WATER zyrtec, flexeril if needed,nexium, zonegran   Do not wear jewelry, make-up or nail polish.  Do not wear lotions, powders, or perfumes. You may wear deodorant.  Do not shave 48 hours prior to surgery.  Do not bring valuables to the hospital.  Contacts, dentures or bridgework may not be worn into surgery.  Leave suitcase in the car. After surgery it may be brought to your room.  For patients admitted to the hospital, checkout time is 11:00 AM the day of discharge.   Patients discharged the day of surgery will not be allowed to drive home.  Name and phone number of your driver:  Special Instructions: CHG Shower Use Special Wash: 1/2 bottle night before surgery and 1/2 bottle morning of surgery.   Please read over the following fact sheets that you were given: Pain Booklet, Coughing and Deep Breathing, MRSA Information and Surgical Site Infection Prevention

## 2011-05-18 MED ORDER — CEFAZOLIN SODIUM-DEXTROSE 2-3 GM-% IV SOLR
2.0000 g | INTRAVENOUS | Status: AC
  Administered 2011-05-19: 2 g via INTRAVENOUS
  Filled 2011-05-18: qty 50

## 2011-05-19 ENCOUNTER — Encounter (HOSPITAL_COMMUNITY)
Admission: RE | Disposition: A | Discharge status: Home / Self Care | Payer: Self-pay | Source: Ambulatory Visit | Attending: General Surgery

## 2011-05-19 ENCOUNTER — Ambulatory Visit (HOSPITAL_COMMUNITY)
Admission: RE | Admit: 2011-05-19 | Discharge: 2011-05-19 | Disposition: A | Discharge status: Home / Self Care | Payer: Federal, State, Local not specified - PPO | Source: Ambulatory Visit | Attending: General Surgery | Admitting: General Surgery

## 2011-05-19 ENCOUNTER — Encounter (HOSPITAL_COMMUNITY): Payer: Self-pay | Admitting: Anesthesiology

## 2011-05-19 ENCOUNTER — Encounter (HOSPITAL_COMMUNITY): Payer: Self-pay | Admitting: *Deleted

## 2011-05-19 ENCOUNTER — Other Ambulatory Visit (INDEPENDENT_AMBULATORY_CARE_PROVIDER_SITE_OTHER): Payer: Self-pay | Admitting: General Surgery

## 2011-05-19 ENCOUNTER — Ambulatory Visit (HOSPITAL_COMMUNITY): Payer: Federal, State, Local not specified - PPO | Admitting: Anesthesiology

## 2011-05-19 ENCOUNTER — Telehealth (INDEPENDENT_AMBULATORY_CARE_PROVIDER_SITE_OTHER): Payer: Self-pay | Admitting: General Surgery

## 2011-05-19 DIAGNOSIS — K8010 Calculus of gallbladder with chronic cholecystitis without obstruction: Secondary | ICD-10-CM

## 2011-05-19 DIAGNOSIS — Z01812 Encounter for preprocedural laboratory examination: Secondary | ICD-10-CM | POA: Insufficient documentation

## 2011-05-19 DIAGNOSIS — Z9049 Acquired absence of other specified parts of digestive tract: Secondary | ICD-10-CM

## 2011-05-19 DIAGNOSIS — K219 Gastro-esophageal reflux disease without esophagitis: Secondary | ICD-10-CM | POA: Insufficient documentation

## 2011-05-19 DIAGNOSIS — J45909 Unspecified asthma, uncomplicated: Secondary | ICD-10-CM | POA: Insufficient documentation

## 2011-05-19 HISTORY — PX: CHOLECYSTECTOMY: SHX55

## 2011-05-19 SURGERY — LAPAROSCOPIC CHOLECYSTECTOMY
Anesthesia: General | Site: Abdomen | Wound class: Contaminated

## 2011-05-19 MED ORDER — MIDAZOLAM HCL 5 MG/5ML IJ SOLN
INTRAMUSCULAR | Status: DC | PRN
  Administered 2011-05-19: 2 mg via INTRAVENOUS

## 2011-05-19 MED ORDER — PROPOFOL 10 MG/ML IV EMUL
INTRAVENOUS | Status: DC | PRN
  Administered 2011-05-19: 190 mg via INTRAVENOUS

## 2011-05-19 MED ORDER — 0.9 % SODIUM CHLORIDE (POUR BTL) OPTIME
TOPICAL | Status: DC | PRN
  Administered 2011-05-19: 1000 mL

## 2011-05-19 MED ORDER — DROPERIDOL 2.5 MG/ML IJ SOLN
INTRAMUSCULAR | Status: DC | PRN
  Administered 2011-05-19: 0.625 mg via INTRAVENOUS

## 2011-05-19 MED ORDER — GLYCOPYRROLATE 0.2 MG/ML IJ SOLN
INTRAMUSCULAR | Status: DC | PRN
  Administered 2011-05-19: .4 mg via INTRAVENOUS

## 2011-05-19 MED ORDER — LACTATED RINGERS IV SOLN
INTRAVENOUS | Status: DC
  Administered 2011-05-19 (×2): via INTRAVENOUS

## 2011-05-19 MED ORDER — DEXAMETHASONE SODIUM PHOSPHATE 4 MG/ML IJ SOLN
INTRAMUSCULAR | Status: DC | PRN
  Administered 2011-05-19: 8 mg via INTRAVENOUS

## 2011-05-19 MED ORDER — HYDROMORPHONE HCL PF 1 MG/ML IJ SOLN
0.2500 mg | INTRAMUSCULAR | Status: DC | PRN
Start: 2011-05-19 — End: 2011-05-19
  Administered 2011-05-19: 0.5 mg via INTRAVENOUS
  Administered 2011-05-19 (×2): 0.25 mg via INTRAVENOUS

## 2011-05-19 MED ORDER — OXYCODONE-ACETAMINOPHEN 5-325 MG PO TABS: 2.0000 | ORAL_TABLET | Freq: Four times a day (QID) | ORAL | Status: AC | PRN

## 2011-05-19 MED ORDER — SODIUM CHLORIDE 0.9 % IR SOLN
Status: DC | PRN
  Administered 2011-05-19: 1

## 2011-05-19 MED ORDER — MEPERIDINE HCL 25 MG/ML IJ SOLN: 6.2500 mg | INTRAMUSCULAR | Status: DC | PRN

## 2011-05-19 MED ORDER — PROMETHAZINE HCL 25 MG/ML IJ SOLN: 6.2500 mg | INTRAMUSCULAR | Status: DC | PRN

## 2011-05-19 MED ORDER — ROCURONIUM BROMIDE 100 MG/10ML IV SOLN
INTRAVENOUS | Status: DC | PRN
  Administered 2011-05-19: 50 mg via INTRAVENOUS

## 2011-05-19 MED ORDER — BUPIVACAINE-EPINEPHRINE 0.25% -1:200000 IJ SOLN
INTRAMUSCULAR | Status: DC | PRN
  Administered 2011-05-19: 12 mL

## 2011-05-19 MED ORDER — FENTANYL CITRATE 0.05 MG/ML IJ SOLN
INTRAMUSCULAR | Status: DC | PRN
  Administered 2011-05-19 (×3): 100 ug via INTRAVENOUS
  Administered 2011-05-19: 50 ug via INTRAVENOUS

## 2011-05-19 MED ORDER — NEOSTIGMINE METHYLSULFATE 1 MG/ML IJ SOLN
INTRAMUSCULAR | Status: DC | PRN
  Administered 2011-05-19: 3 mg via INTRAVENOUS

## 2011-05-19 MED ORDER — ONDANSETRON HCL 4 MG/2ML IJ SOLN
INTRAMUSCULAR | Status: DC | PRN
  Administered 2011-05-19 (×2): 4 mg via INTRAVENOUS

## 2011-05-19 SURGICAL SUPPLY — 53 items
ADH SKN CLS APL DERMABOND .7 (GAUZE/BANDAGES/DRESSINGS)
APPLIER CLIP 5 13 M/L LIGAMAX5 (MISCELLANEOUS) ×3
APPLIER CLIP ROT 10 11.4 M/L (STAPLE)
APR CLP MED LRG 11.4X10 (STAPLE)
APR CLP MED LRG 5 ANG JAW (MISCELLANEOUS) ×2
BAG SPEC RTRVL LRG 6X4 10 (ENDOMECHANICALS) ×2
BLADE SURG ROTATE 9660 (MISCELLANEOUS) ×2 IMPLANT
CANISTER SUCTION 2500CC (MISCELLANEOUS) ×3 IMPLANT
CHLORAPREP W/TINT 26ML (MISCELLANEOUS) ×3 IMPLANT
CLIP APPLIE 5 13 M/L LIGAMAX5 (MISCELLANEOUS) ×2 IMPLANT
CLIP APPLIE ROT 10 11.4 M/L (STAPLE) IMPLANT
CLOTH BEACON ORANGE TIMEOUT ST (SAFETY) ×3 IMPLANT
COVER MAYO STAND STRL (DRAPES) ×3 IMPLANT
COVER SURGICAL LIGHT HANDLE (MISCELLANEOUS) ×3 IMPLANT
DECANTER SPIKE VIAL GLASS SM (MISCELLANEOUS) ×2 IMPLANT
DERMABOND ADVANCED (GAUZE/BANDAGES/DRESSINGS)
DERMABOND ADVANCED .7 DNX12 (GAUZE/BANDAGES/DRESSINGS) ×1 IMPLANT
DRAPE C-ARM 42X72 X-RAY (DRAPES) ×3 IMPLANT
DRAPE UTILITY 15X26 W/TAPE STR (DRAPE) ×6 IMPLANT
ELECT REM PT RETURN 9FT ADLT (ELECTROSURGICAL) ×3
ELECTRODE REM PT RTRN 9FT ADLT (ELECTROSURGICAL) ×2 IMPLANT
FILTER SMOKE EVAC LAPAROSHD (FILTER) ×2 IMPLANT
GLOVE BIO SURGEON STRL SZ8 (GLOVE) ×1 IMPLANT
GLOVE BIOGEL PI IND STRL 7.0 (GLOVE) ×1 IMPLANT
GLOVE BIOGEL PI IND STRL 7.5 (GLOVE) ×3 IMPLANT
GLOVE BIOGEL PI IND STRL 8 (GLOVE) ×3 IMPLANT
GLOVE BIOGEL PI INDICATOR 7.0 (GLOVE) ×1
GLOVE BIOGEL PI INDICATOR 7.5 (GLOVE) ×3
GLOVE BIOGEL PI INDICATOR 8 (GLOVE) ×2
GLOVE SURG SS PI 6.5 STRL IVOR (GLOVE) ×2 IMPLANT
GLOVE SURG SS PI 7.0 STRL IVOR (GLOVE) ×2 IMPLANT
GLOVE SURG SS PI 7.5 STRL IVOR (GLOVE) ×2 IMPLANT
GLOVE SURG SS PI 8.0 STRL IVOR (GLOVE) ×4 IMPLANT
GOWN PREVENTION PLUS XLARGE (GOWN DISPOSABLE) ×5 IMPLANT
GOWN STRL NON-REIN LRG LVL3 (GOWN DISPOSABLE) ×9 IMPLANT
KIT BASIN OR (CUSTOM PROCEDURE TRAY) ×3 IMPLANT
KIT ROOM TURNOVER OR (KITS) ×3 IMPLANT
NS IRRIG 1000ML POUR BTL (IV SOLUTION) ×3 IMPLANT
PAD ARMBOARD 7.5X6 YLW CONV (MISCELLANEOUS) ×6 IMPLANT
POUCH SPECIMEN RETRIEVAL 10MM (ENDOMECHANICALS) ×3 IMPLANT
SCISSORS LAP 5X35 DISP (ENDOMECHANICALS) IMPLANT
SET CHOLANGIOGRAPH 5 50 .035 (SET/KITS/TRAYS/PACK) ×3 IMPLANT
SET IRRIG TUBING LAPAROSCOPIC (IRRIGATION / IRRIGATOR) ×3 IMPLANT
SLEEVE Z-THREAD 5X100MM (TROCAR) ×6 IMPLANT
SPECIMEN JAR SMALL (MISCELLANEOUS) ×3 IMPLANT
SUT VIC AB 4-0 PS2 27 (SUTURE) ×3 IMPLANT
TOWEL OR 17X24 6PK STRL BLUE (TOWEL DISPOSABLE) ×3 IMPLANT
TOWEL OR 17X26 10 PK STRL BLUE (TOWEL DISPOSABLE) ×3 IMPLANT
TRAY LAPAROSCOPIC (CUSTOM PROCEDURE TRAY) ×3 IMPLANT
TROCAR HASSON GELL 12X100 (TROCAR) ×3 IMPLANT
TROCAR Z-THREAD FIOS 11X100 BL (TROCAR) IMPLANT
TROCAR Z-THREAD FIOS 5X100MM (TROCAR) ×3 IMPLANT
WATER STERILE IRR 1000ML POUR (IV SOLUTION) IMPLANT

## 2011-05-19 NOTE — Transfer of Care (Signed)
Immediate Anesthesia Transfer of Care Note  Patient: Natalie Vazquez  Procedure(s) Performed:  LAPAROSCOPIC CHOLECYSTECTOMY  Patient Location: PACU  Anesthesia Type: General  Level of Consciousness: awake, alert , oriented and patient cooperative  Airway & Oxygen Therapy: Patient Spontanous Breathing and Patient connected to nasal cannula oxygen  Post-op Assessment: Report given to PACU RN, Post -op Vital signs reviewed and stable and Patient moving all extremities  Post vital signs: Reviewed and stable  Complications: No apparent anesthesia complications

## 2011-05-19 NOTE — Anesthesia Procedure Notes (Signed)
Procedure Name: Intubation Date/Time: 05/19/2011 9:58 AM Performed by: Fuller Canada Pre-anesthesia Checklist: Patient identified, Timeout performed, Emergency Drugs available, Suction available and Patient being monitored Patient Re-evaluated:Patient Re-evaluated prior to inductionOxygen Delivery Method: Circle System Utilized Preoxygenation: Pre-oxygenation with 100% oxygen Intubation Type: IV induction Ventilation: Mask ventilation without difficulty Laryngoscope Size: Mac and 3 Grade View: Grade I Tube type: Oral Tube size: 7.5 mm Number of attempts: 1 Airway Equipment and Method: stylet Placement Confirmation: ETT inserted through vocal cords under direct vision,  breath sounds checked- equal and bilateral and positive ETCO2 Secured at: 22 cm Tube secured with: Tape Dental Injury: Teeth and Oropharynx as per pre-operative assessment

## 2011-05-19 NOTE — H&P (Signed)
HPI  Meeah Totino Common is a 51 y.o. female.  HPIPatient has been having episodic right upper quadrant abdominal pain that extends around to her back. She has not noticed any association with eating. She was seen in the emergency department on December 11. At this time ultrasound showed multiple mobile gallstones. She did not have significant elevation of liver function tests. She did not have acute cholecystitis. Since that time, she has been watching what she eats. However, she has been having attacks every few days. She also gets some back spasms associated with these attacks. Dr. Ethelene Browns referred her for consultation.  Past Medical History   Diagnosis  Date   .  Arthritis     Past Surgical History   Procedure  Date   .  Tubal ligation    .  Abdominal hysterectomy      partial   Esophageal dilatation for stricture  No family history on file.  Social History  History   Substance Use Topics   .  Smoking status:  Never Smoker   .  Smokeless tobacco:  Not on file   .  Alcohol Use:  Yes    Allergies   Allergen  Reactions   .  Iodine    .  Latex    .  Shellfish Allergy     Current Outpatient Prescriptions   Medication  Sig  Dispense  Refill   .  cetirizine (ZYRTEC) 10 MG tablet  Take 10 mg by mouth daily.     Marland Kitchen  esomeprazole (NEXIUM) 40 MG capsule  Take 40 mg by mouth daily before breakfast.     .  ZONISAMIDE PO  Take by mouth.      Review of Systems  Review of Systems  Constitutional: Negative for fever, chills and unexpected weight change.  HENT: Negative for hearing loss, congestion, sore throat, trouble swallowing and voice change.  Eyes: Negative for visual disturbance.  Respiratory: Negative for cough and wheezing.  Cardiovascular: Negative for chest pain, palpitations and leg swelling.  Gastrointestinal: Positive for abdominal pain. Negative for nausea, vomiting, diarrhea, constipation, blood in stool, abdominal distention and anal bleeding.  See history of present illness   Genitourinary: Negative for hematuria, vaginal bleeding and difficulty urinating.  Musculoskeletal: Negative for arthralgias.  Skin: Negative for rash and wound.  Neurological: Negative for seizures, syncope and headaches.  Hematological: Negative for adenopathy. Does not bruise/bleed easily.  Psychiatric/Behavioral: Negative for confusion.   Blood pressure 140/87, pulse 70, temperature 97 F (36.1 C), temperature source Temporal, resp. rate 18, height 5\' 7"  (1.702 m), weight 233 lb 9.6 oz (105.96 kg).  Physical Exam  Physical Exam  Constitutional: She is oriented to person, place, and time. She appears well-developed and well-nourished.  HENT:  Head: Normocephalic and atraumatic.  Eyes: Conjunctivae and EOM are normal. Pupils are equal, round, and reactive to light.  Neck: Normal range of motion. Neck supple. No tracheal deviation present.  Cardiovascular: Normal rate, regular rhythm and normal heart sounds.  Pulmonary/Chest: Effort normal. No respiratory distress. She has no wheezes. She has no rales.  Abdominal: Soft. Bowel sounds are normal. She exhibits no distension. There is tenderness. There is no rebound and no guarding.  Tenderness is intermittent and very mild in the right upper quadrant  Musculoskeletal: Normal range of motion.  Neurological: She is alert and oriented to person, place, and time.  Skin: Skin is warm and dry. No erythema.   Data Reviewed  U/S and labs  Assessment  Symptomatic cholelithiasis   Plan   I have offered laparoscopic cholecystectomy with cholangiogram. I discussed the procedure in detail. The patient was given Agricultural engineer. We discussed the risks and benefits of a laparoscopic cholecystectomy and possible cholangiogram including, but not limited to bleeding, infection, injury to surrounding structures such as the intestine or liver, bile leak, retained gallstones, need to convert to an open procedure, prolonged diarrhea, blood clots such  as DVT, common bile duct injury, anesthesia risks, and possible need for additional procedures. The likelihood of improvement in symptoms and return to the patient's normal status is good. We discussed the typical post-operative recovery course.   Patient was re-examined in pre-op area.  No changes.  Violeta Gelinas, MD, MPH, FACS Pager: [phone]

## 2011-05-19 NOTE — Anesthesia Postprocedure Evaluation (Signed)
  Anesthesia Post-op Note  Patient: Natalie Vazquez  Procedure(s) Performed:  LAPAROSCOPIC CHOLECYSTECTOMY  Patient Location: PACU  Anesthesia Type: General  Level of Consciousness: awake  Airway and Oxygen Therapy: Patient Spontanous Breathing  Post-op Pain: mild  Post-op Assessment: Post-op Vital signs reviewed  Post-op Vital Signs: stable  Complications: No apparent anesthesia complications

## 2011-05-19 NOTE — Anesthesia Preprocedure Evaluation (Addendum)
Anesthesia Evaluation  Patient identified by MRN, date of birth, ID band Patient awake    Reviewed: Allergy & Precautions, H&P , NPO status , Patient's Chart, lab work & pertinent test results  History of Anesthesia Complications (+) PONV  Airway Mallampati: I TM Distance: >3 FB Neck ROM: Full    Dental  (+) Teeth Intact and Dental Advisory Given   Pulmonary asthma ,  clear to auscultation        Cardiovascular Regular Normal    Neuro/Psych  Headaches,    GI/Hepatic GERD-  ,  Endo/Other    Renal/GU      Musculoskeletal   Abdominal   Peds  Hematology   Anesthesia Other Findings   Reproductive/Obstetrics                           Anesthesia Physical Anesthesia Plan  ASA: II  Anesthesia Plan: General   Post-op Pain Management:    Induction: Intravenous  Airway Management Planned: Oral ETT  Additional Equipment:   Intra-op Plan:   Post-operative Plan: Extubation in OR  Informed Consent: I have reviewed the patients History and Physical, chart, labs and discussed the procedure including the risks, benefits and alternatives for the proposed anesthesia with the patient or authorized representative who has indicated his/her understanding and acceptance.   Dental advisory given  Plan Discussed with: CRNA, Anesthesiologist and Surgeon  Anesthesia Plan Comments:         Anesthesia Quick Evaluation

## 2011-05-19 NOTE — Op Note (Signed)
05/19/2011  10:54 AM  PATIENT:  Natalie Vazquez  51 y.o. female  PRE-OPERATIVE DIAGNOSIS:  symptomatic cholelithiasis  POST-OPERATIVE DIAGNOSIS:  symptomatic cholelithiasis  PROCEDURE:  Procedure(s): LAPAROSCOPIC CHOLECYSTECTOMY  SURGEON:  Surgeon(s): Liz Malady, MD  PHYSICIAN ASSISTANT: Brayton El, PAC  ASSISTANTS: none   ANESTHESIA:   general  EBL:  Total I/O In: 1200 [I.V.:1200] Out: -   BLOOD ADMINISTERED:none  DRAINS: none   SPECIMEN:  Excision  DISPOSITION OF SPECIMEN:  PATHOLOGY  COUNTS:  YES  DICTATION: Reubin Milan Dictation patient presents for cholecystectomy. She's been on the preop holding area. She received intravenous antibiotics. We obtained informed consent. She was brought to the operating room and general endotracheal anesthesia was administered by the anesthesia staff. Her abdomen was prepped and draped in sterile fashion. We did time out procedure. Area above the umbilicus was infiltrated with quarter percent Marcaine with epinephrine. Incision was made subcutaneous tissues were dissected down revealing the anterior fascia. This was divided sharply along the midline. The abdomen was entered under direct vision. A 0 Vicryl pursestring suture was placed on the fascial opening. Hassan trocar was inserted. Abdomen was insufflated with carbon dioxide in standard fashion. Laparoscopic exploration revealed filmy omental adhesions to the body of the gallbladder. A 5 mm epigastric and 25 mm right lateral ports were placed. Local was used at all port sites. Abdomen the gallbladder was retracted superior and medially. We then gradually taken down from the omental adhesions from the body of the gallbladder. There is no other adhesions from the liver medially which we took down and cauterized the area to get good hemostasis. Once infundibulum was freed up it was retracted inferior laterally. Dissection began laterally and progressed medially easily identifying the cystic  duct. We dissected until a critical view was obtained between the cystic duct the infundibulum and the liver. Patient has anaphylaxis to iodine so we did not do a cholangiogram. 3 clips were placed proximally on the cystic duct one was placed distally and it was divided. Cystic artery was dissected out and 2 clips were placed proximally and one was placed distally and it was divided. Gallbladder was taken off the liver bed with Bovie cautery. Was placed in Endo Catch bag and removed from the abdomen via the infraumbilical port site. Liver bed was copiously irrigated and hemostasis was obtained with cautery. Irrigation fluid returned clear. Was no further bleeding. Clips were in good position. Ports were removed under direct vision. Pneumoperitoneum was released. All 4 wounds were copiously irrigated. Supraumbilical fascia was closed by tying the purse and suture. The skin of each wound was closed with running 4-0 Vicryl subcuticular stitch followed by Dermabond. Sponge needle and as we counts were correct. Patient tolerated procedure well without apparent complications and was taken recovery room in stable condition.  PATIENT DISPOSITION:  PACU - hemodynamically stable.   Delay start of Pharmacological VTE agent (>24hrs) due to surgical blood loss or risk of bleeding:  no  Violeta Gelinas, MD, MPH, FACS Pager: [phone]  2/5/201310:54 AM

## 2011-05-19 NOTE — Telephone Encounter (Signed)
I called patient with an appointment for 2/20.  I had to work her in for the postop visit.

## 2011-05-22 ENCOUNTER — Encounter (HOSPITAL_COMMUNITY): Payer: Self-pay | Admitting: General Surgery

## 2011-06-03 ENCOUNTER — Encounter (INDEPENDENT_AMBULATORY_CARE_PROVIDER_SITE_OTHER): Payer: Self-pay

## 2011-06-03 ENCOUNTER — Ambulatory Visit (INDEPENDENT_AMBULATORY_CARE_PROVIDER_SITE_OTHER): Payer: Federal, State, Local not specified - PPO | Admitting: General Surgery

## 2011-06-03 ENCOUNTER — Encounter (INDEPENDENT_AMBULATORY_CARE_PROVIDER_SITE_OTHER): Payer: Self-pay | Admitting: General Surgery

## 2011-06-03 VITALS — BP 130/90 | HR 83 | Temp 97.4°F | Ht 67.0 in | Wt 236.6 lb

## 2011-06-03 DIAGNOSIS — Z9889 Other specified postprocedural states: Secondary | ICD-10-CM

## 2011-06-03 DIAGNOSIS — Z9049 Acquired absence of other specified parts of digestive tract: Secondary | ICD-10-CM

## 2011-06-03 NOTE — Progress Notes (Signed)
Subjective:     Patient ID: Natalie Vazquez, female   DOB: Oct 17, 1960, 51 y.o.   MRN: [id number]  HPI Patient presents status post laparoscopic cholecystectomy on February 5. She's had some significant constipation. She has tried MiraLax, milk of magnesia, and Dulcolax. She is otherwise doing well. She is eating fine.  Review of Systems     Objective:   Physical Exam Abdomen soft and nontender. Incisions are healing well.    Assessment:     Status post laparoscopic cholecystectomy   Plan:     Try daily MiraLax for one week. If she continues to have trouble we will be happy to refer her to a gastroenterologist. I gave her a note for returning to work with full duty starting March 5

## 2011-06-08 ENCOUNTER — Ambulatory Visit
Admission: RE | Admit: 2011-06-08 | Discharge: 2011-06-08 | Disposition: A | Discharge status: Home / Self Care | Payer: Federal, State, Local not specified - PPO | Source: Ambulatory Visit | Attending: Obstetrics | Admitting: Obstetrics

## 2011-06-08 DIAGNOSIS — Z1231 Encounter for screening mammogram for malignant neoplasm of breast: Secondary | ICD-10-CM

## 2011-06-10 ENCOUNTER — Encounter (INDEPENDENT_AMBULATORY_CARE_PROVIDER_SITE_OTHER): Payer: Federal, State, Local not specified - PPO | Admitting: General Surgery

## 2011-07-27 ENCOUNTER — Other Ambulatory Visit: Payer: Self-pay | Admitting: Gastroenterology

## 2011-07-27 ENCOUNTER — Ambulatory Visit
Admission: RE | Admit: 2011-07-27 | Discharge: 2011-07-27 | Disposition: A | Discharge status: Home / Self Care | Payer: Federal, State, Local not specified - PPO | Source: Ambulatory Visit | Attending: Gastroenterology | Admitting: Gastroenterology

## 2011-07-27 DIAGNOSIS — K5900 Constipation, unspecified: Secondary | ICD-10-CM

## 2011-07-27 DIAGNOSIS — R109 Unspecified abdominal pain: Secondary | ICD-10-CM

## 2011-10-14 ENCOUNTER — Telehealth (INDEPENDENT_AMBULATORY_CARE_PROVIDER_SITE_OTHER): Payer: Self-pay | Admitting: General Surgery

## 2011-10-14 NOTE — Telephone Encounter (Signed)
Pt calling to ask who to see for a new problem.  She is S/P lap chole in Feb 2013.  Today she is having trouble taking a deep breath and some pain on the Rt side.  Recommended she see her PCP for evaluation of this problem.  Pt verbalizes understanding.

## 2011-10-26 ENCOUNTER — Encounter: Payer: Self-pay | Admitting: Physician Assistant

## 2011-10-26 LAB — HM COLONOSCOPY

## 2012-01-13 ENCOUNTER — Emergency Department (INDEPENDENT_AMBULATORY_CARE_PROVIDER_SITE_OTHER): Payer: Federal, State, Local not specified - PPO

## 2012-01-13 ENCOUNTER — Encounter (HOSPITAL_COMMUNITY): Payer: Self-pay

## 2012-01-13 ENCOUNTER — Emergency Department (HOSPITAL_COMMUNITY)
Admission: EM | Admit: 2012-01-13 | Discharge: 2012-01-13 | Disposition: A | Discharge status: Home / Self Care | Payer: Federal, State, Local not specified - PPO | Source: Home / Self Care | Attending: Emergency Medicine | Admitting: Emergency Medicine

## 2012-01-13 DIAGNOSIS — S62309A Unspecified fracture of unspecified metacarpal bone, initial encounter for closed fracture: Secondary | ICD-10-CM

## 2012-01-13 MED ORDER — HYDROCODONE-ACETAMINOPHEN 5-325 MG PO TABS
2.0000 | ORAL_TABLET | Freq: Once | ORAL | Status: AC
  Administered 2012-01-13: 2 via ORAL

## 2012-01-13 MED ORDER — HYDROCODONE-ACETAMINOPHEN 5-500 MG PO TABS: 1.0000 | ORAL_TABLET | Freq: Four times a day (QID) | ORAL | Status: DC | PRN

## 2012-01-13 MED ORDER — HYDROCODONE-ACETAMINOPHEN 5-325 MG PO TABS
ORAL_TABLET | ORAL | Status: AC
  Filled 2012-01-13: qty 2
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
## 2012-01-13 NOTE — ED Notes (Signed)
Ortho paged for splint application, promptly responded to page

## 2012-01-19 ENCOUNTER — Other Ambulatory Visit (HOSPITAL_COMMUNITY): Payer: Self-pay | Admitting: Orthopaedic Surgery

## 2012-01-21 ENCOUNTER — Encounter (HOSPITAL_COMMUNITY)
Admission: RE | Admit: 2012-01-21 | Discharge: 2012-01-21 | Disposition: A | Discharge status: Home / Self Care | Payer: Federal, State, Local not specified - PPO | Source: Ambulatory Visit | Attending: Orthopaedic Surgery | Admitting: Orthopaedic Surgery

## 2012-01-21 ENCOUNTER — Encounter (HOSPITAL_COMMUNITY): Payer: Self-pay

## 2012-01-21 LAB — URINALYSIS, ROUTINE W REFLEX MICROSCOPIC
Glucose, UA: NEGATIVE mg/dL
Ketones, ur: NEGATIVE mg/dL
Leukocytes, UA: NEGATIVE
Nitrite: NEGATIVE
Protein, ur: NEGATIVE mg/dL
pH: 6 (ref 5.0–8.0)

## 2012-01-21 LAB — COMPREHENSIVE METABOLIC PANEL
ALT: 16 U/L (ref 0–35)
AST: 21 U/L (ref 0–37)
CO2: 25 mEq/L (ref 19–32)
Calcium: 9.6 mg/dL (ref 8.4–10.5)
Creatinine, Ser: 0.6 mg/dL (ref 0.50–1.10)
GFR calc non Af Amer: >90 mL/min (ref >90)
Sodium: 138 mEq/L (ref 135–145)
Total Protein: 8 g/dL (ref 6.0–8.3)

## 2012-01-21 LAB — CBC
MCH: 26.4 pg (ref 26.0–34.0)
MCHC: 33.3 g/dL (ref 30.0–36.0)
MCV: 79.5 fL (ref 78.0–100.0)
Platelets: 234 10*3/uL (ref 150–400)
RDW: 14.9 % (ref 11.5–15.5)

## 2012-01-21 LAB — SURGICAL PCR SCREEN: MRSA, PCR: POSITIVE — AB

## 2012-01-21 LAB — PROTIME-INR: INR: 0.99 (ref 0.00–1.49)

## 2012-01-21 MED ORDER — CEFAZOLIN SODIUM-DEXTROSE 2-3 GM-% IV SOLR
2.0000 g | INTRAVENOUS | Status: AC
  Administered 2012-01-22: 2 g via INTRAVENOUS
  Filled 2012-01-21: qty 50

## 2012-01-21 NOTE — Pre-Procedure Instructions (Signed)
20 Natalie Vazquez  01/21/2012   Your procedure is scheduled on:  Friday October 11  Report to [facility] at 8:30 AM.  Call this number if you have problems the morning of surgery: [phone]   Remember:   Do not eat or drink:After Midnight.    Take these medicines the morning of surgery with A SIP OF WATER: Cetirizine, Omeprazole, Zonegran. Vicodin pain pill if needed.   Do not wear jewelry, make-up or nail polish.  Do not wear lotions, powders, or perfumes. You may wear deodorant.  Do not shave 48 hours prior to surgery. Men may shave face and neck.  Do not bring valuables to the hospital.  Contacts, dentures or bridgework may not be worn into surgery.  Leave suitcase in the car. After surgery it may be brought to your room.  For patients admitted to the hospital, checkout time is 11:00 AM the day of discharge.   Patients discharged the day of surgery will not be allowed to drive home.  Name and phone number of your driver: Family  Special Instructions: Incentive Spirometry - Practice and bring it with you on the day of surgery. Shower using CHG 2 nights before surgery and the night before surgery.  If you shower the day of surgery use CHG.  Use special wash - you have one bottle of CHG for all showers.  You should use approximately 1/3 of the bottle for each shower.   Please read over the following fact sheets that you were given: Pain Booklet, Coughing and Deep Breathing and Surgical Site Infection Prevention

## 2012-01-22 ENCOUNTER — Encounter (HOSPITAL_COMMUNITY): Payer: Self-pay | Admitting: Certified Registered"

## 2012-01-22 ENCOUNTER — Ambulatory Visit (HOSPITAL_COMMUNITY): Payer: Federal, State, Local not specified - PPO | Admitting: Certified Registered"

## 2012-01-22 ENCOUNTER — Encounter (HOSPITAL_COMMUNITY)
Admission: RE | Disposition: A | Discharge status: Home / Self Care | Payer: Self-pay | Source: Ambulatory Visit | Attending: Orthopaedic Surgery

## 2012-01-22 ENCOUNTER — Ambulatory Visit (HOSPITAL_COMMUNITY)
Admission: RE | Admit: 2012-01-22 | Discharge: 2012-01-22 | Disposition: A | Discharge status: Home / Self Care | Payer: Federal, State, Local not specified - PPO | Source: Ambulatory Visit | Attending: Orthopaedic Surgery | Admitting: Orthopaedic Surgery

## 2012-01-22 ENCOUNTER — Encounter (HOSPITAL_COMMUNITY): Payer: Self-pay | Admitting: *Deleted

## 2012-01-22 DIAGNOSIS — Z01812 Encounter for preprocedural laboratory examination: Secondary | ICD-10-CM | POA: Insufficient documentation

## 2012-01-22 DIAGNOSIS — J45909 Unspecified asthma, uncomplicated: Secondary | ICD-10-CM | POA: Insufficient documentation

## 2012-01-22 DIAGNOSIS — S62329A Displaced fracture of shaft of unspecified metacarpal bone, initial encounter for closed fracture: Secondary | ICD-10-CM

## 2012-01-22 DIAGNOSIS — Y92009 Unspecified place in unspecified non-institutional (private) residence as the place of occurrence of the external cause: Secondary | ICD-10-CM | POA: Insufficient documentation

## 2012-01-22 DIAGNOSIS — Z01818 Encounter for other preprocedural examination: Secondary | ICD-10-CM | POA: Insufficient documentation

## 2012-01-22 DIAGNOSIS — Z01810 Encounter for preprocedural cardiovascular examination: Secondary | ICD-10-CM | POA: Insufficient documentation

## 2012-01-22 DIAGNOSIS — Z01811 Encounter for preprocedural respiratory examination: Secondary | ICD-10-CM | POA: Insufficient documentation

## 2012-01-22 DIAGNOSIS — W2209XA Striking against other stationary object, initial encounter: Secondary | ICD-10-CM | POA: Insufficient documentation

## 2012-01-22 DIAGNOSIS — K219 Gastro-esophageal reflux disease without esophagitis: Secondary | ICD-10-CM | POA: Insufficient documentation

## 2012-01-22 HISTORY — PX: PERCUTANEOUS PINNING: SHX2209

## 2012-01-22 SURGERY — PINNING, EXTREMITY, PERCUTANEOUS
Anesthesia: General | Site: Hand | Laterality: Right | Wound class: Clean

## 2012-01-22 MED ORDER — PROMETHAZINE HCL 25 MG/ML IJ SOLN: 6.2500 mg | INTRAMUSCULAR | Status: DC | PRN

## 2012-01-22 MED ORDER — OXYCODONE HCL 5 MG PO TABS
ORAL_TABLET | ORAL | Status: AC
  Filled 2012-01-22: qty 1

## 2012-01-22 MED ORDER — ONDANSETRON HCL 4 MG/2ML IJ SOLN
INTRAMUSCULAR | Status: DC | PRN
  Administered 2012-01-22: 4 mg via INTRAVENOUS

## 2012-01-22 MED ORDER — VANCOMYCIN HCL IN DEXTROSE 1-5 GM/200ML-% IV SOLN
1000.0000 mg | Freq: Once | INTRAVENOUS | Status: AC
  Administered 2012-01-22: 1000 mg via INTRAVENOUS

## 2012-01-22 MED ORDER — MIDAZOLAM HCL 5 MG/5ML IJ SOLN
INTRAMUSCULAR | Status: DC | PRN
  Administered 2012-01-22: 2 mg via INTRAVENOUS

## 2012-01-22 MED ORDER — SCOPOLAMINE 1 MG/3DAYS TD PT72
1.0000 | MEDICATED_PATCH | Freq: Once | TRANSDERMAL | Status: DC
  Administered 2012-01-22: 1.5 mg via TRANSDERMAL
  Filled 2012-01-22: qty 1

## 2012-01-22 MED ORDER — HYDROMORPHONE HCL PF 1 MG/ML IJ SOLN
0.2500 mg | INTRAMUSCULAR | Status: DC | PRN
  Administered 2012-01-22 (×2): 0.5 mg via INTRAVENOUS

## 2012-01-22 MED ORDER — 0.9 % SODIUM CHLORIDE (POUR BTL) OPTIME
TOPICAL | Status: DC | PRN
  Administered 2012-01-22: 1000 mL

## 2012-01-22 MED ORDER — OXYCODONE HCL 5 MG/5ML PO SOLN: 5.0000 mg | Freq: Once | ORAL | Status: AC | PRN

## 2012-01-22 MED ORDER — OXYCODONE HCL 5 MG PO TABS
5.0000 mg | ORAL_TABLET | Freq: Once | ORAL | Status: AC | PRN
  Administered 2012-01-22: 5 mg via ORAL

## 2012-01-22 MED ORDER — FENTANYL CITRATE 0.05 MG/ML IJ SOLN: 50.0000 ug | Freq: Once | INTRAMUSCULAR | Status: DC

## 2012-01-22 MED ORDER — LIDOCAINE HCL (CARDIAC) 20 MG/ML IV SOLN
INTRAVENOUS | Status: DC | PRN
  Administered 2012-01-22: 80 mg via INTRAVENOUS

## 2012-01-22 MED ORDER — HYDROMORPHONE HCL PF 1 MG/ML IJ SOLN
INTRAMUSCULAR | Status: AC
  Filled 2012-01-22: qty 1

## 2012-01-22 MED ORDER — ACETAMINOPHEN 10 MG/ML IV SOLN
1000.0000 mg | Freq: Once | INTRAVENOUS | Status: AC
  Administered 2012-01-22: 1000 mg via INTRAVENOUS
  Filled 2012-01-22: qty 100

## 2012-01-22 MED ORDER — ACETAMINOPHEN 10 MG/ML IV SOLN
INTRAVENOUS | Status: AC
  Filled 2012-01-22: qty 100

## 2012-01-22 MED ORDER — DEXAMETHASONE SODIUM PHOSPHATE 4 MG/ML IJ SOLN
INTRAMUSCULAR | Status: DC | PRN
  Administered 2012-01-22: 4 mg via INTRAVENOUS

## 2012-01-22 MED ORDER — HYDROCODONE-ACETAMINOPHEN 7.5-325 MG PO TABS: 2.0000 | ORAL_TABLET | Freq: Four times a day (QID) | ORAL | Status: DC | PRN

## 2012-01-22 MED ORDER — PROPOFOL 10 MG/ML IV BOLUS
INTRAVENOUS | Status: DC | PRN
  Administered 2012-01-22: 150 mg via INTRAVENOUS

## 2012-01-22 MED ORDER — LACTATED RINGERS IV SOLN
INTRAVENOUS | Status: DC
  Administered 2012-01-22 (×2): via INTRAVENOUS

## 2012-01-22 MED ORDER — FENTANYL CITRATE 0.05 MG/ML IJ SOLN
INTRAMUSCULAR | Status: DC | PRN
  Administered 2012-01-22 (×2): 25 ug via INTRAVENOUS

## 2012-01-22 MED ORDER — MIDAZOLAM HCL 2 MG/2ML IJ SOLN: 1.0000 mg | INTRAMUSCULAR | Status: DC | PRN

## 2012-01-22 MED ORDER — SCOPOLAMINE 1 MG/3DAYS TD PT72
MEDICATED_PATCH | TRANSDERMAL | Status: AC
  Filled 2012-01-22: qty 1

## 2012-01-22 MED ORDER — VANCOMYCIN HCL IN DEXTROSE 1-5 GM/200ML-% IV SOLN
INTRAVENOUS | Status: AC
  Filled 2012-01-22: qty 200

## 2012-01-22 SURGICAL SUPPLY — 42 items
.045 kwire ×1 IMPLANT
APL SKNCLS STERI-STRIP NONHPOA (GAUZE/BANDAGES/DRESSINGS)
BANDAGE ELASTIC 3 VELCRO ST LF (GAUZE/BANDAGES/DRESSINGS) ×3 IMPLANT
BANDAGE ELASTIC 4 VELCRO ST LF (GAUZE/BANDAGES/DRESSINGS) IMPLANT
BANDAGE GAUZE ELAST BULKY 4 IN (GAUZE/BANDAGES/DRESSINGS) ×2 IMPLANT
BENZOIN TINCTURE PRP APPL 2/3 (GAUZE/BANDAGES/DRESSINGS) IMPLANT
BLADE SURG ROTATE 9660 (MISCELLANEOUS) IMPLANT
CHLORAPREP W/TINT 10.5 ML (MISCELLANEOUS) ×1 IMPLANT
CLOTH BEACON ORANGE TIMEOUT ST (SAFETY) ×2 IMPLANT
COVER SURGICAL LIGHT HANDLE (MISCELLANEOUS) ×2 IMPLANT
CUFF TOURNIQUET SINGLE 18IN (TOURNIQUET CUFF) ×1 IMPLANT
CUFF TOURNIQUET SINGLE 24IN (TOURNIQUET CUFF) IMPLANT
DRSG EMULSION OIL 3X3 NADH (GAUZE/BANDAGES/DRESSINGS) IMPLANT
DURAPREP 26ML APPLICATOR (WOUND CARE) ×1 IMPLANT
GAUZE XEROFORM 1X8 LF (GAUZE/BANDAGES/DRESSINGS) ×1 IMPLANT
GLOVE BIOGEL PI IND STRL 7.5 (GLOVE) ×1 IMPLANT
GLOVE BIOGEL PI IND STRL 8 (GLOVE) ×1 IMPLANT
GLOVE BIOGEL PI INDICATOR 7.5 (GLOVE) ×1
GLOVE BIOGEL PI INDICATOR 8 (GLOVE) ×1
GLOVE ECLIPSE 7.0 STRL STRAW (GLOVE) ×2 IMPLANT
GLOVE ORTHO TXT STRL SZ7.5 (GLOVE) ×2 IMPLANT
GOWN PREVENTION PLUS LG XLONG (DISPOSABLE) IMPLANT
GOWN STRL NON-REIN LRG LVL3 (GOWN DISPOSABLE) ×4 IMPLANT
KIT BASIN OR (CUSTOM PROCEDURE TRAY) ×2 IMPLANT
KIT ROOM TURNOVER OR (KITS) ×2 IMPLANT
MANIFOLD NEPTUNE II (INSTRUMENTS) ×1 IMPLANT
NS IRRIG 1000ML POUR BTL (IV SOLUTION) ×2 IMPLANT
PACK ORTHO EXTREMITY (CUSTOM PROCEDURE TRAY) ×2 IMPLANT
PAD ARMBOARD 7.5X6 YLW CONV (MISCELLANEOUS) ×4 IMPLANT
PAD CAST 3X4 CTTN HI CHSV (CAST SUPPLIES) IMPLANT
PADDING CAST ABS 3INX4YD NS (CAST SUPPLIES) ×1
PADDING CAST ABS COTTON 3X4 (CAST SUPPLIES) IMPLANT
PADDING CAST COTTON 3X4 STRL (CAST SUPPLIES) ×2
SPONGE GAUZE 4X4 12PLY (GAUZE/BANDAGES/DRESSINGS) ×1 IMPLANT
STRIP CLOSURE SKIN 1/2X4 (GAUZE/BANDAGES/DRESSINGS) IMPLANT
SUT ETHILON 4 0 P 3 18 (SUTURE) IMPLANT
SUT ETHILON 5 0 P 3 18 (SUTURE)
SUT NYLON ETHILON 5-0 P-3 1X18 (SUTURE) IMPLANT
SUT PROLENE 4 0 P 3 18 (SUTURE) IMPLANT
TOWEL OR 17X24 6PK STRL BLUE (TOWEL DISPOSABLE) ×2 IMPLANT
TOWEL OR 17X26 10 PK STRL BLUE (TOWEL DISPOSABLE) ×2 IMPLANT
WATER STERILE IRR 1000ML POUR (IV SOLUTION) ×2 IMPLANT

## 2012-01-22 NOTE — Anesthesia Preprocedure Evaluation (Addendum)
Anesthesia Evaluation  Patient identified by MRN, date of birth, ID band Patient awake    Reviewed: Allergy & Precautions, H&P , NPO status , Patient's Chart, lab work & pertinent test results, reviewed documented beta blocker date and time   History of Anesthesia Complications (+) PONV  Airway Mallampati: II TM Distance: >3 FB Neck ROM: Full    Dental   Pulmonary asthma ,  breath sounds clear to auscultation        Cardiovascular Rhythm:Regular Rate:Normal     Neuro/Psych  Headaches,    GI/Hepatic GERD-  ,  Endo/Other    Renal/GU      Musculoskeletal   Abdominal (+) + obese,   Peds  Hematology   Anesthesia Other Findings   Reproductive/Obstetrics                          Anesthesia Physical Anesthesia Plan  ASA: II  Anesthesia Plan: General   Post-op Pain Management:    Induction: Intravenous  Airway Management Planned: LMA  Additional Equipment:   Intra-op Plan:   Post-operative Plan: Extubation in OR  Informed Consent: I have reviewed the patients History and Physical, chart, labs and discussed the procedure including the risks, benefits and alternatives for the proposed anesthesia with the patient or authorized representative who has indicated his/her understanding and acceptance.     Plan Discussed with: CRNA and Surgeon  Anesthesia Plan Comments:         Anesthesia Quick Evaluation

## 2012-01-22 NOTE — Brief Op Note (Signed)
01/22/2012  11:58 AM  PATIENT:  Natalie Vazquez  51 y.o. female  PRE-OPERATIVE DIAGNOSIS:  Right 5th metacarpal fracture  POST-OPERATIVE DIAGNOSIS:  Right 5th metacarpal fracture  PROCEDURE:  Procedure(s) (LRB) with comments: PERCUTANEOUS PINNING EXTREMITY (Right) - Closed Reduction and Pinning right 5th Metacarpal Fracture  SURGEON:  Surgeon(s) and Role:    * Eldred Manges, MD - Primary  PHYSICIAN ASSISTANT:   ASSISTANTS: none   ANESTHESIA:   general  EBL:  Total I/O In: 1000 [I.V.:1000] Out: -   BLOOD ADMINISTERED:none  DRAINS: none   LOCAL MEDICATIONS USED:  NONE  SPECIMEN:  No Specimen  DISPOSITION OF SPECIMEN:  N/A  COUNTS:  NO no tounaquet  TOURNIQUET:  * Missing tourniquet times found for documented tourniquets in log:  [id number] *  DICTATION: .Other Dictation: Dictation Number [id number]  PLAN OF CARE: Discharge to home after PACU  PATIENT DISPOSITION:  PACU - hemodynamically stable.   Delay start of Pharmacological VTE agent (>24hrs) due to surgical blood loss or risk of bleeding: not applicable

## 2012-01-22 NOTE — Preoperative (Signed)
Beta Blockers   Reason not to administer Beta Blockers:Not Applicable 

## 2012-01-22 NOTE — Interval H&P Note (Signed)
History and Physical Interval Note:  01/22/2012 10:24 AM  Natalie Vazquez  has presented today for surgery, with the diagnosis of Right 5th metacarpal fracture  The various methods of treatment have been discussed with the patient and family. After consideration of risks, benefits and other options for treatment, the patient has consented to  Procedure(s) (LRB) with comments: PERCUTANEOUS PINNING EXTREMITY (Right) - Closed Reduction and Pinning vs. Open Reduction Internal Fixation right 5th Metacarpal Fracture as a surgical intervention .  The patient's history has been reviewed, patient examined, no change in status, stable for surgery.  I have reviewed the patient's chart and labs.  Questions were answered to the patient's satisfaction.     Zeidy Tayag C

## 2012-01-22 NOTE — Transfer of Care (Signed)
Immediate Anesthesia Transfer of Care Note  Patient: Natalie Vazquez  Procedure(s) Performed: Procedure(s) (LRB) with comments: PERCUTANEOUS PINNING EXTREMITY (Right) - Closed Reduction and Pinning right 5th Metacarpal Fracture  Patient Location: PACU  Anesthesia Type: General  Level of Consciousness: awake, alert , oriented and patient cooperative  Airway & Oxygen Therapy: Patient Spontanous Breathing and Patient connected to nasal cannula oxygen  Post-op Assessment: Report given to PACU RN, Post -op Vital signs reviewed and stable and Patient moving all extremities  Post vital signs: Reviewed and stable  Complications: No apparent anesthesia complications

## 2012-01-22 NOTE — Anesthesia Procedure Notes (Signed)
Procedure Name: LMA Insertion Date/Time: 01/22/2012 11:07 AM Performed by: Jerilee Hoh Pre-anesthesia Checklist: Patient identified, Emergency Drugs available, Suction available and Patient being monitored Patient Re-evaluated:Patient Re-evaluated prior to inductionOxygen Delivery Method: Circle system utilized Preoxygenation: Pre-oxygenation with 100% oxygen Intubation Type: IV induction LMA: LMA inserted LMA Size: 4.0 Tube type: Oral Number of attempts: 1 Placement Confirmation: positive ETCO2 and breath sounds checked- equal and bilateral Tube secured with: Tape Dental Injury: Teeth and Oropharynx as per pre-operative assessment

## 2012-01-22 NOTE — Anesthesia Postprocedure Evaluation (Signed)
Anesthesia Post Note  Patient: Natalie Vazquez  Procedure(s) Performed: Procedure(s) (LRB): PERCUTANEOUS PINNING EXTREMITY (Right)  Anesthesia type: general  Patient location: PACU  Post pain: Pain level controlled  Post assessment: Patient's Cardiovascular Status Stable  Last Vitals:  Filed Vitals:   01/22/12 1245  BP: 106/72  Pulse:   Temp: 36.4 C  Resp:     Post vital signs: Reviewed and stable  Level of consciousness: sedated  Complications: No apparent anesthesia complications
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
## 2012-01-22 NOTE — Progress Notes (Signed)
Orthopedic Tech Progress Note Patient Details:  Natalie Vazquez 01-Nov-1960 [id number]  Ortho Devices Type of Ortho Device: Arm foam sling Ortho Device/Splint Location: Right arm sling Ortho Device/Splint Interventions: Application   Shawnie Pons 01/22/2012, 1:07 PM

## 2012-01-23 NOTE — Op Note (Signed)
Natalie Vazquez, Natalie Vazquez               ACCOUNT NO.:  [id number]  MEDICAL RECORD NO.:  [id number]  LOCATION:  [facility]                         FACILITY:  [facility]  PHYSICIAN:  Jalila Goodnough C. Ophelia Charter, M.D.    DATE OF BIRTH:  Oct 23, 1960  DATE OF PROCEDURE:  01/22/2012 DATE OF DISCHARGE:  01/22/2012                              OPERATIVE REPORT   PREOPERATIVE DIAGNOSIS:  Right fifth metacarpal shaft fracture angulated and rotated.  POSTOPERATIVE DIAGNOSIS:  Right fifth metacarpal shaft fracture angulated and rotated.  PROCEDURE:  Closed reduction, percutaneous pinning.  Fifth metacarpal shaft fracture.  SURGEON:  Dahlia Nifong C. Ophelia Charter, MD  EBL:  Minimal.  TOURNIQUET:  None.  DESCRIPTION OF PROCEDURE:  After induction of general anesthesia, prepping to the midforearm with DuraPrep.  A time-out procedure was completed.  Due to the patient's MRSA history, vanc and Ancef were given in case the fracture had to be opened to get reduction achieved.  With the wrist in extension, MCPs flexed, after time-out procedure, PIPs were flexed, metacarpal head was pushed up, rotation was confirmed with good reduction checked under the mini C-arm.  A pin was placed from proximal lateral or ulnar to distal radial across the fracture site.  It was backed up, adjusted. Second pin was placed from distal to proximal starting on the ulnar aspect of the fifth metacarpal and the distal fragment with cross pins bicortical.  Final spot photographs were taken, pins were cut, bent over, and a circle and ulnar gutter splint was placed fourth and fifth finger with wrist extended, MCP flexed, PIP straight, DIP straight.  The patient tolerated the procedure well, was transferred to the recovery room in stable condition.  Skin was released and procedure was as posted.     Kushi Kun C. Ophelia Charter, M.D.     MCY/MEDQ  D:  01/23/2012  T:  01/23/2012  Job:  [id number]

## 2012-01-25 ENCOUNTER — Encounter (HOSPITAL_COMMUNITY): Payer: Self-pay | Admitting: Orthopaedic Surgery

## 2012-05-12 ENCOUNTER — Other Ambulatory Visit (HOSPITAL_COMMUNITY): Payer: Self-pay | Admitting: Obstetrics

## 2012-05-12 DIAGNOSIS — Z1231 Encounter for screening mammogram for malignant neoplasm of breast: Secondary | ICD-10-CM

## 2012-06-08 ENCOUNTER — Ambulatory Visit (HOSPITAL_COMMUNITY): Payer: Federal, State, Local not specified - PPO | Attending: Obstetrics

## 2013-02-19 ENCOUNTER — Encounter (HOSPITAL_COMMUNITY): Payer: Self-pay | Admitting: Emergency Medicine

## 2013-02-19 ENCOUNTER — Emergency Department (INDEPENDENT_AMBULATORY_CARE_PROVIDER_SITE_OTHER)
Admission: EM | Admit: 2013-02-19 | Discharge: 2013-02-19 | Disposition: A | Discharge status: Home / Self Care | Payer: Federal, State, Local not specified - PPO | Source: Home / Self Care

## 2013-02-19 DIAGNOSIS — S46819A Strain of other muscles, fascia and tendons at shoulder and upper arm level, unspecified arm, initial encounter: Secondary | ICD-10-CM

## 2013-02-19 DIAGNOSIS — S43499A Other sprain of unspecified shoulder joint, initial encounter: Secondary | ICD-10-CM

## 2013-02-19 DIAGNOSIS — M609 Myositis, unspecified: Secondary | ICD-10-CM

## 2013-02-19 DIAGNOSIS — IMO0001 Reserved for inherently not codable concepts without codable children: Secondary | ICD-10-CM

## 2013-02-19 HISTORY — DX: Migraine, unspecified, not intractable, without status migrainosus: G43.909

## 2013-02-19 MED ORDER — HYDROCODONE-ACETAMINOPHEN 5-325 MG PO TABS: 1.0000 | ORAL_TABLET | ORAL | Status: DC | PRN

## 2013-02-19 MED ORDER — DICLOFENAC POTASSIUM 50 MG PO TABS: 50.0000 mg | ORAL_TABLET | Freq: Three times a day (TID) | ORAL | Status: DC

## 2013-02-19 NOTE — ED Provider Notes (Signed)
CSN: [id number]     Arrival date & time 02/19/13  0902 History   First MD Initiated Contact with Patient 02/19/13 0915     Chief Complaint  Patient presents with  . Neck Pain   (Consider location/radiation/quality/duration/timing/severity/associated sxs/prior Treatment) HPI Comments: 52 year old female awoke apparently 5 or 6 days ago with soreness in the muscles of the neck and shoulders. His muscles evidence of most of the week. 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 She has taken different types of NSAIDs as well as cyclobenzaprine but she states none of these have relieved them. She is continued to work through the pain exacerbating her condition. The pain is located primarily across the bilateral trapezii ridge and the bilateral paracervical musculature. Denies any known injury, fall or other injuries today.   Past Medical History  Diagnosis Date  . Asthma   . Frequent urination   . GERD (gastroesophageal reflux disease)   . Headache(784.0)   . PONV (postoperative nausea and vomiting)     difficulty waking up  . Arthritis   . Migraine    Past Surgical History  Procedure Laterality Date  . Tubal ligation    . Abdominal hysterectomy      partial  . Laser ablation condyloma cervical / vulvar    . Cholecystectomy  05/19/2011    Procedure: LAPAROSCOPIC CHOLECYSTECTOMY;  Surgeon: Liz Malady, MD;  Location: [facility] OR;  Service: General;  Laterality: N/A;  . Percutaneous pinning  01/22/2012    Procedure: PERCUTANEOUS PINNING EXTREMITY;  Surgeon: Eldred Manges, MD;  Location: [facility] OR;  Service: Orthopedics;  Laterality: Right;  Closed Reduction and Pinning right 5th Metacarpal Fracture   No family history on file. History  Substance Use Topics  . Smoking status: Never Smoker   . Smokeless tobacco: Not on file  . Alcohol Use: Yes     Comment: rarely   OB History   Grav Para Term Preterm Abortions TAB SAB Ect Mult Living                 Review of Systems   Constitutional: Negative for fever, chills and activity change.  HENT: Negative.   Respiratory: Negative.   Cardiovascular: Negative.   Musculoskeletal:       As per HPI  Skin: Negative for color change, pallor and rash.  Neurological: Negative.  Negative for tremors, seizures, syncope, facial asymmetry, speech difficulty, weakness and light-headedness.    Allergies  Iodine; Shellfish allergy; and Latex  Home Medications   Current Outpatient Rx  Name  Route  Sig  Dispense  Refill  . cetirizine (ZYRTEC) 10 MG tablet   Oral   Take 5 mg by mouth daily.          . cyclobenzaprine (FLEXERIL) 10 MG tablet   Oral   Take 10 mg by mouth 3 (three) times daily as needed. For pain         . esomeprazole (NEXIUM) 40 MG capsule   Oral   Take 40 mg by mouth daily before breakfast.          . HYDROcodone-acetaminophen (NORCO) 7.5-325 MG per tablet   Oral   Take 2 tablets by mouth every 6 (six) hours as needed for pain.   40 tablet   0   . lubiprostone (AMITIZA) 8 MCG capsule   Oral   Take 8 mcg by mouth 2 (two) times daily with a meal.         . OVER  THE COUNTER MEDICATION   Oral   Take 1 capsule by mouth daily. MULTIVITAMINS AND MINERALS         . OVER THE COUNTER MEDICATION   Oral   Take 2 drops by mouth daily. VITAMIN D 1000 UNITS WITH VITAMIN A 250 UNITS         . zonisamide (ZONEGRAN) 25 MG capsule   Oral   Take 50 mg by mouth daily.         . diclofenac (CATAFLAM) 50 MG tablet   Oral   Take 1 tablet (50 mg total) by mouth 3 (three) times daily. Take with food.   21 tablet   o   . HYDROcodone-acetaminophen (NORCO/VICODIN) 5-325 MG per tablet   Oral   Take 1 tablet by mouth every 4 (four) hours as needed.   15 tablet   0    BP 150/51  Pulse 112  Temp(Src) 98.2 F (36.8 C) (Oral)  Resp 18  SpO2 99% Physical Exam  Constitutional: She is oriented to person, place, and time. She appears well-developed and well-nourished. No distress.  HENT:   Head: Normocephalic and atraumatic.  Eyes: EOM are normal. Pupils are equal, round, and reactive to light.  Neck: Normal range of motion. Neck supple.  Cardiovascular: Normal rate and normal heart sounds.   Pulmonary/Chest: Effort normal and breath sounds normal. No respiratory distress.  Musculoskeletal: She exhibits tenderness. She exhibits no edema.  Diminished range of motion of the neck due to muscle pain. Rotation bilaterally less than 10. Flection less than 10. Tenderness across the trapezii ridge is an the para cervical musculature. Pain with attempts to shrug shoulders or abduct her arms. No tenderness of the upper arms.  Lymphadenopathy:    She has no cervical adenopathy.  Neurological: She is alert and oriented to person, place, and time. No cranial nerve deficit.  Skin: Skin is warm and dry.  Psychiatric: She has a normal mood and affect.    ED Course  Procedures (including critical care time) Labs Review Labs Reviewed - No data to display Imaging Review No results found.    MDM   1. Myofasciitis   2. Trapezius strain, unspecified laterality, initial encounter      Soft cervical collar Continue taking your muscle relaxant Norco 5 mg q. 4 hours when necessary pain 15 Cataflam 50 mg 3 times a day with food when necessary   No over head work.  Followup with your PCP as needed. Out work 2 days.Hayden Rasmussen, NP 02/19/13 [phone]

## 2013-02-19 NOTE — ED Notes (Signed)
C/O gradually worsening neck pain x2 days - worst on left lateral side.  C/O inability to move neck due to severe pain with any amount of movement.  Has tried flexeril, IBU, and Icy Hot without relief.  Denies any injury or unusual activity.

## 2013-02-19 NOTE — ED Provider Notes (Signed)
Medical screening examination/treatment/procedure(s) were performed by resident physician or non-physician practitioner and as supervising physician I was immediately available for consultation/collaboration.   Barkley Bruns MD.   Linna Hoff, MD 02/19/13 [phone]

## 2013-03-16 ENCOUNTER — Ambulatory Visit (INDEPENDENT_AMBULATORY_CARE_PROVIDER_SITE_OTHER): Payer: Federal, State, Local not specified - PPO | Admitting: Physician Assistant

## 2013-03-16 ENCOUNTER — Other Ambulatory Visit: Payer: Self-pay | Admitting: Physician Assistant

## 2013-03-16 ENCOUNTER — Encounter: Payer: Self-pay | Admitting: Physician Assistant

## 2013-03-16 VITALS — BP 138/90 | HR 61 | Temp 97.9°F | Resp 16 | Ht 67.0 in | Wt 214.2 lb

## 2013-03-16 DIAGNOSIS — J309 Allergic rhinitis, unspecified: Secondary | ICD-10-CM

## 2013-03-16 DIAGNOSIS — R202 Paresthesia of skin: Secondary | ICD-10-CM

## 2013-03-16 DIAGNOSIS — J302 Other seasonal allergic rhinitis: Secondary | ICD-10-CM

## 2013-03-16 DIAGNOSIS — R209 Unspecified disturbances of skin sensation: Secondary | ICD-10-CM

## 2013-03-16 DIAGNOSIS — K5900 Constipation, unspecified: Secondary | ICD-10-CM

## 2013-03-16 DIAGNOSIS — M542 Cervicalgia: Secondary | ICD-10-CM

## 2013-03-16 DIAGNOSIS — K589 Irritable bowel syndrome without diarrhea: Secondary | ICD-10-CM

## 2013-03-16 DIAGNOSIS — G8929 Other chronic pain: Secondary | ICD-10-CM

## 2013-03-16 DIAGNOSIS — Z0000 Encounter for general adult medical examination without abnormal findings: Secondary | ICD-10-CM

## 2013-03-16 DIAGNOSIS — G5600 Carpal tunnel syndrome, unspecified upper limb: Secondary | ICD-10-CM

## 2013-03-16 DIAGNOSIS — Z23 Encounter for immunization: Secondary | ICD-10-CM

## 2013-03-16 DIAGNOSIS — K219 Gastro-esophageal reflux disease without esophagitis: Secondary | ICD-10-CM

## 2013-03-16 DIAGNOSIS — R200 Anesthesia of skin: Secondary | ICD-10-CM

## 2013-03-16 LAB — URINALYSIS, ROUTINE W REFLEX MICROSCOPIC
Bilirubin Urine: NEGATIVE
Glucose, UA: NEGATIVE mg/dL
Hgb urine dipstick: NEGATIVE
Ketones, ur: NEGATIVE mg/dL
Protein, ur: NEGATIVE mg/dL

## 2013-03-16 LAB — CBC WITH DIFFERENTIAL/PLATELET
Basophils Relative: 0 % (ref 0–1)
Eosinophils Absolute: 0.2 10*3/uL (ref 0.0–0.7)
Eosinophils Relative: 3 % (ref 0–5)
HCT: 37.7 % (ref 36.0–46.0)
Hemoglobin: 12.7 g/dL (ref 12.0–15.0)
Lymphs Abs: 2.7 10*3/uL (ref 0.7–4.0)
Monocytes Relative: 7 % (ref 3–12)
RDW: 15.3 % (ref 11.5–15.5)
WBC: 5.5 10*3/uL (ref 4.0–10.5)

## 2013-03-16 LAB — LIPID PANEL
Cholesterol: 129 mg/dL (ref 0–200)
Total CHOL/HDL Ratio: 2.3 Ratio
VLDL: 11 mg/dL (ref 0–40)

## 2013-03-16 LAB — BASIC METABOLIC PANEL WITH GFR
CO2: 27 mEq/L (ref 19–32)
Calcium: 9.1 mg/dL (ref 8.4–10.5)
Creat: 0.65 mg/dL (ref 0.50–1.10)
Glucose, Bld: 88 mg/dL (ref 70–99)

## 2013-03-16 LAB — HEPATIC FUNCTION PANEL
ALT: 12 U/L (ref 0–35)
AST: 16 U/L (ref 0–37)
Albumin: 3.8 g/dL (ref 3.5–5.2)
Total Protein: 7.3 g/dL (ref 6.0–8.3)

## 2013-03-16 LAB — VITAMIN B12: Vitamin B-12: 490 pg/mL (ref 211–911)

## 2013-03-16 LAB — TSH: TSH: 1.415 u[IU]/mL (ref 0.350–4.500)

## 2013-03-16 MED ORDER — ESOMEPRAZOLE MAGNESIUM 40 MG PO CPDR: 40.0000 mg | DELAYED_RELEASE_CAPSULE | Freq: Every day | ORAL | Status: DC

## 2013-03-16 MED ORDER — ZONISAMIDE 25 MG PO CAPS: 50.0000 mg | ORAL_CAPSULE | Freq: Every day | ORAL | Status: DC

## 2013-03-16 MED ORDER — CYCLOBENZAPRINE HCL 10 MG PO TABS: 10.0000 mg | ORAL_TABLET | Freq: Three times a day (TID) | ORAL | Status: DC | PRN

## 2013-03-16 MED ORDER — CETIRIZINE HCL 10 MG PO TABS: 5.0000 mg | ORAL_TABLET | Freq: Every day | ORAL | Status: DC

## 2013-03-16 NOTE — Progress Notes (Signed)
Pre visit review using our clinic review tool, if applicable. No additional management support is needed unless otherwise documented below in the visit note/SLS  

## 2013-03-16 NOTE — Patient Instructions (Signed)
Please obtain labs.  I will call you with all of your results.  For your Carpal Tunnel Syndrome -- Take Ibuprofen or Aleve as needed.  Wear wrist splints.  Avoid repetitive wrist motions -- typing, etc.  If symptoms do not improve, will possibly need injection.  Please read information below on DASH diet.  DASH Diet The DASH diet stands for "Dietary Approaches to Stop Hypertension." It is a healthy eating plan that has been shown to reduce high blood pressure (hypertension) in as little as 14 days, while also possibly providing other significant health benefits. These other health benefits include reducing the risk of breast cancer after menopause and reducing the risk of type 2 diabetes, heart disease, colon cancer, and stroke. Health benefits also include weight loss and slowing kidney failure in patients with chronic kidney disease.  DIET GUIDELINES  Limit salt (sodium). Your diet should contain less than 1500 mg of sodium daily.  Limit refined or processed carbohydrates. Your diet should include mostly whole grains. Desserts and added sugars should be used sparingly.  Include small amounts of heart-healthy fats. These types of fats include nuts, oils, and tub margarine. Limit saturated and trans fats. These fats have been shown to be harmful in the body. CHOOSING FOODS  The following food groups are based on a 2000 calorie diet. See your Registered Dietitian for individual calorie needs. Grains and Grain Products (6 to 8 servings daily)  Eat More Often: Whole-wheat bread, brown rice, whole-grain or wheat pasta, quinoa, popcorn without added fat or salt (air popped).  Eat Less Often: White bread, white pasta, white rice, cornbread. Vegetables (4 to 5 servings daily)  Eat More Often: Fresh, frozen, and canned vegetables. Vegetables may be raw, steamed, roasted, or grilled with a minimal amount of fat.  Eat Less Often/Avoid: Creamed or fried vegetables. Vegetables in a cheese sauce. Fruit (4  to 5 servings daily)  Eat More Often: All fresh, canned (in natural juice), or frozen fruits. Dried fruits without added sugar. One hundred percent fruit juice ( cup [237 mL] daily).  Eat Less Often: Dried fruits with added sugar. Canned fruit in light or heavy syrup. [employer], Fish, and Poultry (2 servings or less daily. One serving is 3 to 4 oz [85-114 g]).  Eat More Often: Ninety percent or leaner ground beef, tenderloin, sirloin. Round cuts of beef, chicken breast, [place] breast. All fish. Grill, bake, or broil your meat. Nothing should be fried.  Eat Less Often/Avoid: Fatty cuts of meat, [place], or chicken leg, thigh, or wing. Fried cuts of meat or fish. Dairy (2 to 3 servings)  Eat More Often: Low-fat or fat-free milk, low-fat plain or light yogurt, reduced-fat or part-skim cheese.  Eat Less Often/Avoid: Milk (whole, 2%).Whole milk yogurt. Full-fat cheeses. Nuts, Seeds, and Legumes (4 to 5 servings per week)  Eat More Often: All without added salt.  Eat Less Often/Avoid: Salted nuts and seeds, canned beans with added salt. Fats and Sweets (limited)  Eat More Often: Vegetable oils, tub margarines without trans fats, sugar-free gelatin. Mayonnaise and salad dressings.  Eat Less Often/Avoid: Coconut oils, palm oils, butter, stick margarine, cream, half and half, cookies, candy, pie. FOR MORE INFORMATION The Dash Diet Eating Plan: www.dashdiet.org Document Released: 03/19/2011 Document Revised: 06/22/2011 Document Reviewed: 03/19/2011 [facility] Patient Information 2014 [place], [place].

## 2013-03-16 NOTE — Telephone Encounter (Signed)
Rx request to pharmacy/SLS  

## 2013-03-16 NOTE — Progress Notes (Signed)
Patient ID: Natalie Vazquez, female   DOB: 1960-10-12, 52 y.o.   MRN: [id number]  Patient presents today to establish care. Patient is fasting for labs.   Acute Concerns: Patient complains of occasional bilateral hand numbness, associated with tingling.  Symptoms of transient. Usually worse first thing in the morning. Patient endorses history of carpal tunnel syndrome and has wrist splints at home. Has not worn splints are take any medication for her symptoms.  Denies any recent trauma to bilateral upper extremities. Denies numbness, tingling or pain elsewhere. No history of diabetes or vitamin B 12 deficiency.  Chronic Issues: Asthma -- patient endorses history of exercise-induced asthma for which he has an albuterol inhaler. States she cannot result times use the inhaler. Denies recent shortness of breath or chronic cough.  Neck Pain -- chronic with occasional flareups. Takes Flexeril as needed.  GERD -- patient currently on Nexium 40 mg capsule daily. Endorses adequate control of symptoms.  IBS -- fluctuating diarrhea and constipation. Has prescription for lubiprostone that she uses rarely.  Endorses symptoms mostly controlled by watching what she eats.  Health Maintenance: Vision -- UTD Dental -- overdue Immunizations -- no flu shot.  TDaP over 10 years.   Mammogram -- 2014; no abnormalities Colonoscopy -- 2014; no abnormalities -- every 10 years. PAP Smear -- s/p hysterectomy  Past Medical History  Diagnosis Date  . Asthma   . Frequent urination   . GERD (gastroesophageal reflux disease)   . Headache(784.0)   . PONV (postoperative nausea and vomiting)     difficulty waking up  . Arthritis   . Migraine   . Chicken pox     Current Outpatient Prescriptions on File Prior to Visit  Medication Sig Dispense Refill  . lubiprostone (AMITIZA) 8 MCG capsule Take 8 mcg by mouth 2 (two) times daily with a meal.      . OVER THE COUNTER MEDICATION Take 1 capsule by mouth daily.  MULTIVITAMINS AND MINERALS      . OVER THE COUNTER MEDICATION Take 2 drops by mouth daily. VITAMIN D 1000 UNITS WITH VITAMIN A 250 UNITS      . [DISCONTINUED] phentermine 37.5 MG capsule Take 37.5 mg by mouth every morning.       No current facility-administered medications on file prior to visit.    Allergies  Allergen Reactions  . Iodine Anaphylaxis  . Shellfish Allergy Anaphylaxis  . Latex Rash    Family History  Problem Relation Age of Onset  . Hypertension Father   . Stroke Father   . Diabetes Father     Legs Amputated  . Heart disease Father   . Arthritis Father   . Diabetes Mother   . Hypertension Mother   . Anemia Mother   . Hyperlipidemia Mother   . Arthritis/Rheumatoid Mother   . Migraines Mother   . Heart attack Maternal Aunt   . Diabetes Maternal Uncle   . Migraines Maternal Aunt   . Asthma Maternal Aunt   . Asthma Maternal Uncle   . Stomach cancer Sister   . Breast cancer Sister   . Uterine cancer Sister   . Sleep apnea Brother   . COPD Brother   . Asthma Sister     x2  . Sudden death Brother     SIDS  . Hypertension Brother   . Diabetes Sister   . Asthma Daughter     x2  . Allergies Daughter   . Sickle cell trait Other  All Children  . Atrial fibrillation Daughter   . Anxiety disorder Daughter   . Allergies Son     History   Social History  . Marital Status: Single    Spouse Name: N/A    Number of Children: N/A  . Years of Education: N/A   Social History Main Topics  . Smoking status: Never Smoker   . Smokeless tobacco: Never Used  . Alcohol Use: Yes     Comment: rarely   . Drug Use: No  . Sexual Activity: None   Other Topics Concern  . None   Social History Narrative  . None   Review of Systems  Constitutional: Negative for fever, weight loss and malaise/fatigue.  HENT: Negative for ear discharge, ear pain, hearing loss and tinnitus.   Eyes: Negative for blurred vision, double vision, photophobia and pain.  Respiratory:  Negative for cough, shortness of breath and wheezing.   Cardiovascular: Negative for chest pain and palpitations.  Gastrointestinal: Positive for heartburn and constipation. Negative for nausea, vomiting, abdominal pain, diarrhea, blood in stool and melena.  Genitourinary: Negative for dysuria, urgency, frequency, hematuria and flank pain.  Musculoskeletal: Positive for neck pain.  Neurological: Negative for dizziness, seizures, loss of consciousness and headaches.  Endo/Heme/Allergies: Positive for environmental allergies.  Psychiatric/Behavioral: Negative for depression, suicidal ideas, hallucinations and substance abuse. The patient is not nervous/anxious and does not have insomnia.    Filed Vitals:   03/16/13 0837  BP: 138/90  Pulse: 61  Temp: 97.9 F (36.6 C)  Resp: 16   Physical Exam  Vitals reviewed. Constitutional: She is oriented to person, place, and time and well-developed, well-nourished, and in no distress.  HENT:  Head: Normocephalic and atraumatic.  Right Ear: External ear normal.  Left Ear: External ear normal.  Nose: Nose normal.  Mouth/Throat: Oropharynx is clear and moist. No oropharyngeal exudate.  Tympanic membranes within normal limits bilaterally.  Eyes: Conjunctivae and EOM are normal. Pupils are equal, round, and reactive to light.  Neck: Neck supple. No thyromegaly present.  Cardiovascular: Normal rate, regular rhythm, normal heart sounds and intact distal pulses.   Pulmonary/Chest: Effort normal and breath sounds normal. No respiratory distress. She has no wheezes. She has no rales. She exhibits no tenderness.  Abdominal: Soft. Bowel sounds are normal. She exhibits no distension and no mass. There is no tenderness. There is no rebound and no guarding.  Musculoskeletal: She exhibits no tenderness.  Lymphadenopathy:    She has no cervical adenopathy.  Neurological: She is alert and oriented to person, place, and time. No cranial nerve deficit.  Positive  Tinel and Phalen sign bilaterally.  Skin: Skin is warm and dry. No rash noted.  Psychiatric: Affect normal.   Assessment/Plan: Encounter for preventive health examination History reviewed and updated. Patient up-to-date on health maintenance parameters. DTP given by nursing staff. Will obtain fasting labs. Medications refilled  GERD (gastroesophageal reflux disease) Continue current regimen.  Carpal tunnel syndrome NSAIDs. Rest. Use wrist splints. If symptoms are not improving, may need referral for injection.  Neck pain, chronic Asymptomatic at present. Refill Flexeril for prn use for flare-ups

## 2013-03-19 DIAGNOSIS — K589 Irritable bowel syndrome without diarrhea: Secondary | ICD-10-CM | POA: Insufficient documentation

## 2013-03-19 DIAGNOSIS — H1010 Acute atopic conjunctivitis, unspecified eye: Secondary | ICD-10-CM | POA: Insufficient documentation

## 2013-03-19 DIAGNOSIS — K219 Gastro-esophageal reflux disease without esophagitis: Secondary | ICD-10-CM | POA: Insufficient documentation

## 2013-03-19 DIAGNOSIS — G8929 Other chronic pain: Secondary | ICD-10-CM | POA: Insufficient documentation

## 2013-03-19 DIAGNOSIS — J302 Other seasonal allergic rhinitis: Secondary | ICD-10-CM | POA: Insufficient documentation

## 2013-03-19 DIAGNOSIS — G5600 Carpal tunnel syndrome, unspecified upper limb: Secondary | ICD-10-CM | POA: Insufficient documentation

## 2013-03-19 DIAGNOSIS — Z0000 Encounter for general adult medical examination without abnormal findings: Secondary | ICD-10-CM | POA: Insufficient documentation

## 2013-03-19 NOTE — Assessment & Plan Note (Signed)
Continue current regimen

## 2013-03-19 NOTE — Assessment & Plan Note (Signed)
History reviewed and updated. Patient up-to-date on health maintenance parameters. DTP given by nursing staff. Will obtain fasting labs. Medications refilled

## 2013-03-19 NOTE — Assessment & Plan Note (Signed)
NSAIDs. Rest. Use wrist splints. If symptoms are not improving, may need referral for injection.

## 2013-03-19 NOTE — Assessment & Plan Note (Signed)
Asymptomatic at present. Refill Flexeril for prn use for flare-ups

## 2013-03-20 ENCOUNTER — Telehealth: Payer: Self-pay | Admitting: Physician Assistant

## 2013-03-20 NOTE — Telephone Encounter (Signed)
Pt was seen on 03/16/13 and received tdap injection, pt now has rash on her face, it is getting worse everyday. Please Advise

## 2013-03-20 NOTE — Telephone Encounter (Addendum)
Rash is not one of the common side effects of vaccination, especially when rash is not at the site of injection.  Patint does have history of latex allergy causing rash, and although rare, can occur with vaccination.  I am off tomorrow.  I would advise her to take some benadryl and schedule an appointment with either myself on Wednesday or Melissa tomorrow for evaluation and treatment, as there are other possibilities for the cause of rash.

## 2013-03-21 NOTE — Telephone Encounter (Signed)
Notified pt and she states rash is some better today. Scheduled pt appt for tomorrow at 9:15 with [facility].

## 2013-03-22 ENCOUNTER — Encounter: Payer: Self-pay | Admitting: Physician Assistant

## 2013-03-22 ENCOUNTER — Ambulatory Visit (INDEPENDENT_AMBULATORY_CARE_PROVIDER_SITE_OTHER): Payer: Federal, State, Local not specified - PPO | Admitting: Physician Assistant

## 2013-03-22 VITALS — BP 118/82 | HR 63 | Temp 98.1°F | Ht 67.0 in | Wt 215.0 lb

## 2013-03-22 DIAGNOSIS — R21 Rash and other nonspecific skin eruption: Secondary | ICD-10-CM

## 2013-03-22 NOTE — Progress Notes (Signed)
Pre visit review using our clinic review tool, if applicable. No additional management support is needed unless otherwise documented below in the visit note. 

## 2013-03-22 NOTE — Patient Instructions (Signed)
Benadryl at bedtime.  Can use claritin in the morning.  Cool compresses.  Sarna lotion.  OTC hydrocortisone cream. Avoid makeup, harsh soaps or other hygiene products.  Keep area clean and dry.  Avoid excess exposure to sunlight.  I am glad that your symptoms are resolving on their own.  If rash is still present in 1-2 days of if it worsens, please call office and with will send in a prescription for oral prednisone.  I do not want to give you a prescription-strength steroid cream because your rash is just under your eyelids and the cream may damage your skin.

## 2013-03-25 DIAGNOSIS — R21 Rash and other nonspecific skin eruption: Secondary | ICD-10-CM | POA: Insufficient documentation

## 2013-03-25 NOTE — Progress Notes (Signed)
Patient ID: Natalie Vazquez, female   DOB: 1961-03-07, 52 y.o.   MRN: [id number]  Patient presents to clinic today complaining of a rash on her face, first noticed a couple days after having a DTaP booster.  Patient does have latex allergy.  Patient states the rash is pruritic in nature. Patient states the rash result was also on her torso but has since resolved. States he area of concern on her face is actually improved considerably compared to the day before. Patient has taken a Benadryl.  Discussed possible causes of rash including tetanus booster. Vaccine itself does not contain latex. However upon review of vaccine information on CDC website, sometimes the packaging does have exposure to latex.  Patient denies shortness of breath, facial or neck swelling.   Past Medical History  Diagnosis Date  . Asthma   . Frequent urination   . GERD (gastroesophageal reflux disease)   . Headache(784.0)   . PONV (postoperative nausea and vomiting)     difficulty waking up  . Arthritis   . Migraine   . Chicken pox     Current Outpatient Prescriptions on File Prior to Visit  Medication Sig Dispense Refill  . albuterol (PROVENTIL HFA;VENTOLIN HFA) 108 (90 BASE) MCG/ACT inhaler Inhale 1-2 puffs into the lungs every 6 (six) hours as needed for wheezing or shortness of breath.      . cetirizine (ZYRTEC) 10 MG tablet Take 0.5 tablets (5 mg total) by mouth daily.  30 tablet  2  . cyclobenzaprine (FLEXERIL) 10 MG tablet Take 1 tablet (10 mg total) by mouth 3 (three) times daily as needed. For pain  30 tablet  0  . esomeprazole (NEXIUM) 40 MG capsule Take 1 capsule (40 mg total) by mouth daily before breakfast.  30 capsule  3  . Ginkgo Biloba 40 MG TABS Take by mouth. W/Ginseng; liquid vials      . lubiprostone (AMITIZA) 8 MCG capsule Take 8 mcg by mouth 2 (two) times daily with a meal.      . Multiple Vitamins-Minerals (MULTIVITAMIN WITH MINERALS) tablet Take 1 tablet by mouth daily. Daily MVI w/Iron; Alternate  w/Prenatal MVI      . NON FORMULARY Take 3 tablets by mouth as needed. Swiss Kriss Herbal Laxative Tab      . OVER THE COUNTER MEDICATION Take 1 capsule by mouth daily. MULTIVITAMINS AND MINERALS      . OVER THE COUNTER MEDICATION Take 2 drops by mouth daily. VITAMIN D 1000 UNITS WITH VITAMIN A 250 UNITS      . Prenatal Vit-Fe Fumarate-FA (PRENATAL VITAMINS PLUS PO) Take by mouth. W/Folic Acid; Alternate w/Daily MVI      . RA GARLIC/PARSLEY PO Take 3 capsules by mouth daily. Garlic 1.2 mg/Parsley 2 mg      . vitamin E 400 UNIT capsule Take 400 Units by mouth daily.      Marland Kitchen zonisamide (ZONEGRAN) 25 MG capsule TAKE 2 CAPSULE BY MOUTH DAILY  180 capsule  0  . [DISCONTINUED] phentermine 37.5 MG capsule Take 37.5 mg by mouth every morning.       No current facility-administered medications on file prior to visit.    Allergies  Allergen Reactions  . Iodine Anaphylaxis  . Shellfish Allergy Anaphylaxis  . Latex Rash    Family History  Problem Relation Age of Onset  . Hypertension Father   . Stroke Father   . Diabetes Father     Legs Amputated  . Heart disease Father   .  Arthritis Father   . Diabetes Mother   . Hypertension Mother   . Anemia Mother   . Hyperlipidemia Mother   . Arthritis/Rheumatoid Mother   . Migraines Mother   . Heart attack Maternal Aunt   . Diabetes Maternal Uncle   . Migraines Maternal Aunt   . Asthma Maternal Aunt   . Asthma Maternal Uncle   . Stomach cancer Sister   . Breast cancer Sister   . Uterine cancer Sister   . Sleep apnea Brother   . COPD Brother   . Asthma Sister     x2  . Sudden death Brother     SIDS  . Hypertension Brother   . Diabetes Sister   . Asthma Daughter     x2  . Allergies Daughter   . Sickle cell trait Other     All Children  . Atrial fibrillation Daughter   . Anxiety disorder Daughter   . Allergies Son     History   Social History  . Marital Status: Single    Spouse Name: N/A    Number of Children: N/A  . Years of  Education: N/A   Social History Main Topics  . Smoking status: Never Smoker   . Smokeless tobacco: Never Used  . Alcohol Use: Yes     Comment: rarely   . Drug Use: No  . Sexual Activity: None   Other Topics Concern  . None   Social History Narrative  . None   Review of Systems - see history of present illness. All other review of systems are negative.  Filed Vitals:   03/22/13 0910  BP: 118/82  Pulse: 63  Temp: 98.1 F (36.7 C)   Physical Exam  Vitals reviewed. Constitutional: She is oriented to person, place, and time and well-developed, well-nourished, and in no distress.  HENT:  Head: Normocephalic and atraumatic.  Mouth/Throat: Oropharynx is clear and moist. No oropharyngeal exudate.  Eyes: Conjunctivae and EOM are normal. Pupils are equal, round, and reactive to light.  Neck: Neck supple.  Cardiovascular: Normal rate, regular rhythm and normal heart sounds.   Pulmonary/Chest: Effort normal and breath sounds normal. No respiratory distress. She has no wheezes. She has no rales. She exhibits no tenderness.  Lymphadenopathy:    She has no cervical adenopathy.  Neurological: She is alert and oriented to person, place, and time.  Skin: Skin is warm, dry and intact. Rash noted. Rash is macular.  Presence of scattered macules just inferior to orbit bilaterally. No rash noted elsewhere. No facial swelling or edema noted on examination.  Psychiatric: Affect normal.     Recent Results (from the past 2160 hour(s))  CBC WITH DIFFERENTIAL     Status: Abnormal   Collection Time    03/16/13  9:55 AM      Result Value Range   WBC 5.5  4.0 - 10.5 K/uL   RBC 5.14 (*) 3.87 - 5.11 MIL/uL   Hemoglobin 12.7  12.0 - 15.0 g/dL   HCT 01.0  27.2 - 53.6 %   MCV 73.3 (*) 78.0 - 100.0 fL   MCH 24.7 (*) 26.0 - 34.0 pg   MCHC 33.7  30.0 - 36.0 g/dL   RDW 64.4  03.4 - 74.2 %   Platelets 233  150 - 400 K/uL   Neutrophils Relative % 40 (*) 43 - 77 %   Neutro Abs 2.2  1.7 - 7.7 K/uL    Lymphocytes Relative 50 (*) 12 - 46 %  Lymphs Abs 2.7  0.7 - 4.0 K/uL   Monocytes Relative 7  3 - 12 %   Monocytes Absolute 0.4  0.1 - 1.0 K/uL   Eosinophils Relative 3  0 - 5 %   Eosinophils Absolute 0.2  0.0 - 0.7 K/uL   Basophils Relative 0  0 - 1 %   Basophils Absolute 0.0  0.0 - 0.1 K/uL   Smear Review Criteria for review not met    BASIC METABOLIC PANEL WITH GFR     Status: None   Collection Time    03/16/13  9:55 AM      Result Value Range   Sodium 138  135 - 145 mEq/L   Potassium 4.0  3.5 - 5.3 mEq/L   Chloride 105  96 - 112 mEq/L   CO2 27  19 - 32 mEq/L   Glucose, Bld 88  70 - 99 mg/dL   BUN 9  6 - 23 mg/dL   Creat 9.60  4.54 - 0.98 mg/dL   Calcium 9.1  8.4 - 11.9 mg/dL   GFR, Est African American >89     GFR, Est Non African American >89     Comment:       The estimated GFR is a calculation valid for adults (>=77 years old)     that uses the CKD-EPI algorithm to adjust for age and sex. It is       not to be used for children, pregnant women, hospitalized patients,        patients on dialysis, or with rapidly changing kidney function.     According to the NKDEP, eGFR >89 is normal, 60-89 shows mild     impairment, 30-59 shows moderate impairment, 15-29 shows severe     impairment and <15 is ESRD.        HEPATIC FUNCTION PANEL     Status: None   Collection Time    03/16/13  9:55 AM      Result Value Range   Total Bilirubin 0.3  0.3 - 1.2 mg/dL   Bilirubin, Direct 0.1  0.0 - 0.3 mg/dL   Indirect Bilirubin 0.2  0.0 - 0.9 mg/dL   Alkaline Phosphatase 48  39 - 117 U/L   AST 16  0 - 37 U/L   ALT 12  0 - 35 U/L   Total Protein 7.3  6.0 - 8.3 g/dL   Albumin 3.8  3.5 - 5.2 g/dL  TSH     Status: None   Collection Time    03/16/13  9:55 AM      Result Value Range   TSH 1.415  0.350 - 4.500 uIU/mL  HEMOGLOBIN A1C     Status: Abnormal   Collection Time    03/16/13  9:55 AM      Result Value Range   Hemoglobin A1C 6.4 (*) <5.7 %   Comment:                                                                             According to the ADA Clinical Practice Recommendations for 2011, when     HbA1c is used as a screening test:             >=  6.5%   Diagnostic of Diabetes Mellitus                (if abnormal result is confirmed)           5.7-6.4%   Increased risk of developing Diabetes Mellitus           References:Diagnosis and Classification of Diabetes Mellitus,Diabetes     Care,2011,34(Suppl 1):S62-S69 and Standards of Medical Care in             Diabetes - 2011,Diabetes Care,2011,34 (Suppl 1):S11-S61.         Mean Plasma Glucose 137 (*) <117 mg/dL  URINALYSIS, ROUTINE W REFLEX MICROSCOPIC     Status: None   Collection Time    03/16/13  9:55 AM      Result Value Range   Color, Urine YELLOW  YELLOW   APPearance CLEAR  CLEAR   Specific Gravity, Urine 1.025  1.005 - 1.030   pH 5.5  5.0 - 8.0   Glucose, UA NEG  NEG mg/dL   Bilirubin Urine NEG  NEG   Ketones, ur NEG  NEG mg/dL   Hgb urine dipstick NEG  NEG   Protein, ur NEG  NEG mg/dL   Urobilinogen, UA 0.2  0.0 - 1.0 mg/dL   Nitrite NEG  NEG   Leukocytes, UA NEG  NEG  LIPID PANEL     Status: None   Collection Time    03/16/13  9:55 AM      Result Value Range   Cholesterol 129  0 - 200 mg/dL   Comment: ATP III Classification:           < 200        mg/dL        Desirable          200 - 239     mg/dL        Borderline High          >= 240        mg/dL        High         Triglycerides 57  <150 mg/dL   HDL 55  >16 mg/dL   Total CHOL/HDL Ratio 2.3     VLDL 11  0 - 40 mg/dL   LDL Cholesterol 63  0 - 99 mg/dL   Comment:       Total Cholesterol/HDL Ratio:CHD Risk                            Coronary Heart Disease Risk Table                                            Men       Women              1/2 Average Risk              3.4        3.3                  Average Risk              5.0        4.4               2X Average Risk  9.6        7.1               3X Average Risk              23.4       11.0     Use the calculated Patient Ratio above and the CHD Risk table      to determine the patient's CHD Risk.     ATP III Classification (LDL):           < 100        mg/dL         Optimal          100 - 129     mg/dL         Near or Above Optimal          130 - 159     mg/dL         Borderline High          160 - 189     mg/dL         High           > 190        mg/dL         Very High        VITAMIN B12     Status: None   Collection Time    03/16/13  9:55 AM      Result Value Range   Vitamin B-12 490  211 - 911 pg/mL    Assessment/Plan: Rash and nonspecific skin eruption Improving daily. Discussed use of prednisone. Patient wishes to forego steroid at present time. Cold compresses, Sarna lotion and Benadryl for itch.  Call if symptoms do not continue to improve.

## 2013-03-25 NOTE — Assessment & Plan Note (Signed)
Improving daily. Discussed use of prednisone. Patient wishes to forego steroid at present time. Cold compresses, Sarna lotion and Benadryl for itch.  Call if symptoms do not continue to improve.

## 2013-03-29 ENCOUNTER — Encounter: Payer: Self-pay | Admitting: Physician Assistant

## 2013-03-29 NOTE — Progress Notes (Signed)
Pre visit review using our clinic review tool, if applicable. No additional management support is needed unless otherwise documented below in the visit note/SLS  

## 2013-03-29 NOTE — Progress Notes (Signed)
Patient ID: Natalie Vazquez, female   DOB: March 01, 1961, 52 y.o.   MRN: [id number]     Physical Exam

## 2013-04-03 ENCOUNTER — Telehealth: Payer: Self-pay | Admitting: Physician Assistant

## 2013-04-03 NOTE — Telephone Encounter (Signed)
Received medical records from [facility]  P: [phone] F: [phone]

## 2013-04-04 ENCOUNTER — Telehealth: Payer: Self-pay | Admitting: Physician Assistant

## 2013-04-04 DIAGNOSIS — G5600 Carpal tunnel syndrome, unspecified upper limb: Secondary | ICD-10-CM

## 2013-04-04 NOTE — Telephone Encounter (Signed)
Patient states that she would like to proceed with orthopaedic referral for her wrist

## 2013-04-04 NOTE — Telephone Encounter (Signed)
Referral placed.  Patient will be contacted by Orthopedist office to schedule an appointment.

## 2013-04-12 ENCOUNTER — Telehealth: Payer: Self-pay | Admitting: Physician Assistant

## 2013-04-12 NOTE — Telephone Encounter (Signed)
Received medical records from [facility]  [place]: [phone] F: [phone]

## 2013-04-13 HISTORY — PX: CARPAL TUNNEL RELEASE: SHX101

## 2013-04-14 ENCOUNTER — Telehealth: Payer: Self-pay | Admitting: Physician Assistant

## 2013-04-14 NOTE — Telephone Encounter (Signed)
Received medical records from Dr. Raynelle Highland: [phone] F: [phone]

## 2013-06-18 ENCOUNTER — Other Ambulatory Visit: Payer: Self-pay | Admitting: Physician Assistant

## 2013-06-19 NOTE — Telephone Encounter (Signed)
Rx request to pharmacy, 30-day only/SLS **NO FURTHER REFILLS WITHOUT OFFICE VISIT**

## 2013-08-04 ENCOUNTER — Other Ambulatory Visit: Payer: Self-pay | Admitting: Physician Assistant

## 2013-08-04 NOTE — Telephone Encounter (Signed)
eScribe request from [facility] for refill on Zonegran 25 mg Last filled - Historical med only [12.04.15 new pt] Last AEX - 12.10.14 Next AEX - not stated in AVS Please Advise on refills/SLS

## 2013-08-23 ENCOUNTER — Ambulatory Visit (INDEPENDENT_AMBULATORY_CARE_PROVIDER_SITE_OTHER): Payer: Federal, State, Local not specified - PPO

## 2013-08-23 ENCOUNTER — Encounter: Payer: Self-pay | Admitting: Podiatry

## 2013-08-23 ENCOUNTER — Ambulatory Visit (INDEPENDENT_AMBULATORY_CARE_PROVIDER_SITE_OTHER): Payer: Federal, State, Local not specified - PPO | Admitting: Podiatry

## 2013-08-23 VITALS — BP 142/80 | HR 77 | Resp 12

## 2013-08-23 DIAGNOSIS — M79609 Pain in unspecified limb: Secondary | ICD-10-CM

## 2013-08-23 DIAGNOSIS — M722 Plantar fascial fibromatosis: Secondary | ICD-10-CM

## 2013-08-23 NOTE — Patient Instructions (Signed)
  With your shoes on do bent knee stretches 3 times a day x2 minutes(wall pushups)  Use over-the-counter 200 mg ibuprofen tablets by mouth 2 tablets 3 times a day x7-14 days Contact primary care physician to evaluate weight gain.   Plantar Fasciitis Plantar fasciitis is a common condition that causes foot pain. It is soreness (inflammation) of the band of tough fibrous tissue on the bottom of the foot that runs from the heel bone (calcaneus) to the ball of the foot. The cause of this soreness may be from excessive standing, poor fitting shoes, running on hard surfaces, being overweight, having an abnormal walk, or overuse (this is common in runners) of the painful foot or feet. It is also common in aerobic exercise dancers and ballet dancers. SYMPTOMS  Most people with plantar fasciitis complain of:  Severe pain in the morning on the bottom of their foot especially when taking the first steps out of bed. This pain recedes after a few minutes of walking.  Severe pain is experienced also during walking following a long period of inactivity.  Pain is worse when walking barefoot or up stairs DIAGNOSIS   Your caregiver will diagnose this condition by examining and feeling your foot.  Special tests such as X-rays of your foot, are usually not needed. PREVENTION   Consult a sports medicine professional before beginning a new exercise program.  Walking programs offer a good workout. With walking there is a lower chance of overuse injuries common to runners. There is less impact and less jarring of the joints.  Begin all new exercise programs slowly. If problems or pain develop, decrease the amount of time or distance until you are at a comfortable level.  Wear good shoes and replace them regularly.  Stretch your foot and the heel cords at the back of the ankle (Achilles tendon) both before and after exercise.  Run or exercise on even surfaces that are not hard. For example, asphalt is better  than pavement.  Do not run barefoot on hard surfaces.  If using a treadmill, vary the incline.  Do not continue to workout if you have foot or joint problems. Seek professional help if they do not improve. HOME CARE INSTRUCTIONS   Avoid activities that cause you pain until you recover.  Use ice or cold packs on the problem or painful areas after working out.  Only take over-the-counter or prescription medicines for pain, discomfort, or fever as directed by your caregiver.  Soft shoe inserts or athletic shoes with air or gel sole cushions may be helpful.  If problems continue or become more severe, consult a sports medicine caregiver or your own health care provider. Cortisone is a potent anti-inflammatory medication that may be injected into the painful area. You can discuss this treatment with your caregiver. MAKE SURE YOU:   Understand these instructions.  Will watch your condition.  Will get help right away if you are not doing well or get worse. Document Released: 12/23/2000 Document Revised: 06/22/2011 Document Reviewed: 02/22/2008 [facility] Patient Information 2014 [place], [place].

## 2013-08-23 NOTE — Progress Notes (Signed)
   Subjective:    Patient ID: Natalie Vazquez, female    DOB: 14-Nov-1960, 53 y.o.   MRN: [id number]  HPI PT STATED BOTH UNDER THE FOOT ARE THROBBING AND PAINFUL FOR 11 MONTHS. THE FEET ARE GETTING WORSE. THE GET AGGRAVATED BY WALKING AND STANDING AND TRIED BIOFREEZE, WEAR ORTHOTICS, AND ELEVATED IT HELP SOME.  Patient has a long history of weight-bearing occupation at the [employer]. She currently has custom orthotics that she wears in her work shoes for similar problem that was diagnosed approximately 3 years prior. The orthotics reduce some of her foot pain. She says that she said it 30 pound weight gain in the past 6 months without any increase of food intake, change and medication or physical activity.  Review of Systems  Constitutional: Positive for fatigue.  HENT: Positive for hearing loss.   Genitourinary: Positive for urgency.  Musculoskeletal: Positive for gait problem.       Objective:   Physical Exam Orientated x3 space black female  Vascular: DP pulses 2/4 bilaterally PT pulses 2/4 bilaterally  Neurological: Sensation to 10 g monofilament wire intact 4/5 right and 5/5 left Vibratory sensation intact bilaterally Ankle reflexes reactive bilaterally  Dermatological: Texture turgor within normal limits  Musculoskeletal: Weightbearing demonstrates a painful gait pattern Medium/low medial longitudinal arch with a vertical heel upon weightbearing Palpable tenderness in the mid arches bilaterally without a palpable lesions Palpable tenderness medial navicular areas bilaterally    X-ray examination right foot   Intact bony structure without fracture and/or dislocation  Posterior calcaneal spur  HAV deformity  Hammer second toe  Pes planus  Radiographic compression:  No acute bony abnormality noted in the right foot     X-ray examination left foot   Intact bony structure without fracture and/or dislocation  HAV deformity  Hammer second toe  Pes planus    Radiographic impression:  No acute bony abnormality noted left foot        Assessment & Plan:   Assessment: Bilateral fasciitis Unexplained weight gain  Plan: Continue wearing custom orthotics Recommend stretching Over-the-counter ibuprofen tablets 200 mg by mouth 2 tablets 3 times a day 7-14  Reappoint if symptoms do not improve

## 2013-08-24 ENCOUNTER — Encounter: Payer: Self-pay | Admitting: Podiatry

## 2013-08-25 ENCOUNTER — Encounter: Payer: Self-pay | Admitting: Physician Assistant

## 2013-08-28 ENCOUNTER — Encounter: Payer: Self-pay | Admitting: Physician Assistant

## 2013-08-29 ENCOUNTER — Telehealth: Payer: Self-pay

## 2013-08-29 DIAGNOSIS — K589 Irritable bowel syndrome without diarrhea: Secondary | ICD-10-CM

## 2013-08-29 MED ORDER — LUBIPROSTONE 24 MCG PO CAPS: 24.0000 ug | ORAL_CAPSULE | Freq: Every day | ORAL | Status: DC

## 2013-08-29 NOTE — Telephone Encounter (Signed)
I have not had any refill requests come into my in-basket that I can remember.  Medication is a historical medicine.  I have no problem refilling as we have discussed her IBS and constipation symptoms before.  Refill has been sent to her pharmacy with additional refills in place.

## 2013-08-29 NOTE — Telephone Encounter (Signed)
Patient left a message stating that she was requesting her Amitiza to be refilled?  Please advise? It doesn't look like you have refilled

## 2013-08-31 ENCOUNTER — Other Ambulatory Visit: Payer: Self-pay | Admitting: Physician Assistant

## 2013-08-31 NOTE — Telephone Encounter (Signed)
Medication Detail      Disp Refills Start End     esomeprazole (NEXIUM) 40 MG capsule 30 capsule 1 08/31/2013     Sig: TAKE ONE CAPSULE BY MOUTH EVERY MORNING BEFORE BREAKFAST    E-Prescribing Status: Receipt confirmed by pharmacy (08/31/2013 11:33 AM EDT)       Patient request 90-day supply; **Office Visit Needed Prior to Future Refills**/sls

## 2013-08-31 NOTE — Telephone Encounter (Signed)
Rx request to pharmacy/SLS  

## 2013-09-07 ENCOUNTER — Ambulatory Visit (INDEPENDENT_AMBULATORY_CARE_PROVIDER_SITE_OTHER): Payer: Federal, State, Local not specified - PPO | Admitting: Physician Assistant

## 2013-09-07 ENCOUNTER — Encounter: Payer: Self-pay | Admitting: Physician Assistant

## 2013-09-07 VITALS — BP 104/80 | HR 72 | Temp 98.2°F | Resp 16 | Ht 67.0 in | Wt 227.8 lb

## 2013-09-07 DIAGNOSIS — R5383 Other fatigue: Secondary | ICD-10-CM

## 2013-09-07 DIAGNOSIS — Z0000 Encounter for general adult medical examination without abnormal findings: Secondary | ICD-10-CM

## 2013-09-07 DIAGNOSIS — J309 Allergic rhinitis, unspecified: Secondary | ICD-10-CM

## 2013-09-07 DIAGNOSIS — J302 Other seasonal allergic rhinitis: Secondary | ICD-10-CM

## 2013-09-07 DIAGNOSIS — R5381 Other malaise: Secondary | ICD-10-CM

## 2013-09-07 DIAGNOSIS — K219 Gastro-esophageal reflux disease without esophagitis: Secondary | ICD-10-CM

## 2013-09-07 LAB — HEPATIC FUNCTION PANEL
ALK PHOS: 66 U/L (ref 39–117)
ALT: 20 U/L (ref 0–35)
AST: 22 U/L (ref 0–37)
Albumin: 4.2 g/dL (ref 3.5–5.2)
BILIRUBIN INDIRECT: 0.2 mg/dL (ref 0.2–1.2)
Bilirubin, Direct: 0.1 mg/dL (ref 0.0–0.3)
Total Bilirubin: 0.3 mg/dL (ref 0.2–1.2)
Total Protein: 7.4 g/dL (ref 6.0–8.3)

## 2013-09-07 LAB — CBC WITH DIFFERENTIAL/PLATELET
BASOS PCT: 0 % (ref 0–1)
Basophils Absolute: 0 10*3/uL (ref 0.0–0.1)
Eosinophils Absolute: 0.2 10*3/uL (ref 0.0–0.7)
Eosinophils Relative: 3 % (ref 0–5)
HEMATOCRIT: 37.5 % (ref 36.0–46.0)
HEMOGLOBIN: 12.5 g/dL (ref 12.0–15.0)
Lymphocytes Relative: 50 % — ABNORMAL HIGH (ref 12–46)
Lymphs Abs: 2.6 10*3/uL (ref 0.7–4.0)
MCH: 25.1 pg — ABNORMAL LOW (ref 26.0–34.0)
MCHC: 33.3 g/dL (ref 30.0–36.0)
MCV: 75.3 fL — ABNORMAL LOW (ref 78.0–100.0)
MONOS PCT: 8 % (ref 3–12)
Monocytes Absolute: 0.4 10*3/uL (ref 0.1–1.0)
NEUTROS ABS: 2 10*3/uL (ref 1.7–7.7)
NEUTROS PCT: 39 % — AB (ref 43–77)
Platelets: 229 10*3/uL (ref 150–400)
RBC: 4.98 MIL/uL (ref 3.87–5.11)
RDW: 15.2 % (ref 11.5–15.5)
WBC: 5.2 10*3/uL (ref 4.0–10.5)

## 2013-09-07 LAB — BASIC METABOLIC PANEL
BUN: 6 mg/dL (ref 6–23)
CHLORIDE: 103 meq/L (ref 96–112)
CO2: 29 mEq/L (ref 19–32)
Calcium: 9.3 mg/dL (ref 8.4–10.5)
Creat: 0.72 mg/dL (ref 0.50–1.10)
GLUCOSE: 108 mg/dL — AB (ref 70–99)
POTASSIUM: 4.3 meq/L (ref 3.5–5.3)
SODIUM: 138 meq/L (ref 135–145)

## 2013-09-07 LAB — CALCIUM: CALCIUM: 9.3 mg/dL (ref 8.4–10.5)

## 2013-09-07 LAB — LIPID PANEL
Cholesterol: 153 mg/dL (ref 0–200)
HDL: 65 mg/dL (ref >39)
LDL Cholesterol: 73 mg/dL (ref 0–99)
Total CHOL/HDL Ratio: 2.4 Ratio
Triglycerides: 76 mg/dL (ref <150)
VLDL: 15 mg/dL (ref 0–40)

## 2013-09-07 LAB — HEMOGLOBIN A1C
HEMOGLOBIN A1C: 6.5 % — AB (ref <5.7)
Mean Plasma Glucose: 140 mg/dL — ABNORMAL HIGH (ref <117)

## 2013-09-07 LAB — T4: T4 TOTAL: 7.6 ug/dL (ref 5.0–12.5)

## 2013-09-07 LAB — TSH: TSH: 0.738 u[IU]/mL (ref 0.350–4.500)

## 2013-09-07 NOTE — Patient Instructions (Signed)
Please obtain labs. I will call you with your results. You will be contacted by Ophthalmology for an appointment to evaluate your eyes.  You will also be contacted by the breast center for a screening mammogram.    Preventive Care for Adults, Female A healthy lifestyle and preventive care can promote health and wellness. Preventive health guidelines for women include the following key practices.  A routine yearly physical is a good way to check with your health care provider about your health and preventive screening. It is a chance to share any concerns and updates on your health and to receive a thorough exam.  Visit your dentist for a routine exam and preventive care every 6 months. Brush your teeth twice a day and floss once a day. Good oral hygiene prevents tooth decay and gum disease.  The frequency of eye exams is based on your age, health, family medical history, use of contact lenses, and other factors. Follow your health care provider's recommendations for frequency of eye exams.  Eat a healthy diet. Foods like vegetables, fruits, whole grains, low-fat dairy products, and lean protein foods contain the nutrients you need without too many calories. Decrease your intake of foods high in solid fats, added sugars, and salt. Eat the right amount of calories for you.Get information about a proper diet from your health care provider, if necessary.  Regular physical exercise is one of the most important things you can do for your health. Most adults should get at least 150 minutes of moderate-intensity exercise (any activity that increases your heart rate and causes you to sweat) each week. In addition, most adults need muscle-strengthening exercises on 2 or more days a week.  Maintain a healthy weight. The body mass index (BMI) is a screening tool to identify possible weight problems. It provides an estimate of body fat based on height and weight. Your health care provider can find your BMI, and  can help you achieve or maintain a healthy weight.For adults 20 years and older:  A BMI below 18.5 is considered underweight.  A BMI of 18.5 to 24.9 is normal.  A BMI of 25 to 29.9 is considered overweight.  A BMI of 30 and above is considered obese.  Maintain normal blood lipids and cholesterol levels by exercising and minimizing your intake of saturated fat. Eat a balanced diet with plenty of fruit and vegetables. Blood tests for lipids and cholesterol should begin at age 41 and be repeated every 5 years. If your lipid or cholesterol levels are high, you are over 50, or you are at high risk for heart disease, you may need your cholesterol levels checked more frequently.Ongoing high lipid and cholesterol levels should be treated with medicines if diet and exercise are not working.  If you smoke, find out from your health care provider how to quit. If you do not use tobacco, do not start.  Lung cancer screening is recommended for adults aged 1 80 years who are at high risk for developing lung cancer because of a history of smoking. A yearly low-dose CT scan of the lungs is recommended for people who have at least a 30-pack-year history of smoking and are a current smoker or have quit within the past 15 years. A pack year of smoking is smoking an average of 1 pack of cigarettes a day for 1 year (for example: 1 pack a day for 30 years or 2 packs a day for 15 years). Yearly screening should continue until the smoker  has stopped smoking for at least 15 years. Yearly screening should be stopped for people who develop a health problem that would prevent them from having lung cancer treatment.  If you are pregnant, do not drink alcohol. If you are breastfeeding, be very cautious about drinking alcohol. If you are not pregnant and choose to drink alcohol, do not have more than 1 drink per day. One drink is considered to be 12 ounces (355 mL) of beer, 5 ounces (148 mL) of wine, or 1.5 ounces (44 mL) of  liquor.  Avoid use of street drugs. Do not share needles with anyone. Ask for help if you need support or instructions about stopping the use of drugs.  High blood pressure causes heart disease and increases the risk of stroke. Your blood pressure should be checked at least every 1 to 2 years. Ongoing high blood pressure should be treated with medicines if weight loss and exercise do not work.  If you are 32 51 years old, ask your health care provider if you should take aspirin to prevent strokes.  Diabetes screening involves taking a blood sample to check your fasting blood sugar level. This should be done once every 3 years, after age 74, if you are within normal weight and without risk factors for diabetes. Testing should be considered at a younger age or be carried out more frequently if you are overweight and have at least 1 risk factor for diabetes.  Breast cancer screening is essential preventive care for women. You should practice "breast self-awareness." This means understanding the normal appearance and feel of your breasts and may include breast self-examination. Any changes detected, no matter how small, should be reported to a health care provider. Women in their 32s and 30s should have a clinical breast exam (CBE) by a health care provider as part of a regular health exam every 1 to 3 years. After age 76, women should have a CBE every year. Starting at age 87, women should consider having a mammogram (breast X-ray test) every year. Women who have a family history of breast cancer should talk to their health care provider about genetic screening. Women at a high risk of breast cancer should talk to their health care providers about having an MRI and a mammogram every year.  Breast cancer gene (BRCA)-related cancer risk assessment is recommended for women who have family members with BRCA-related cancers. BRCA-related cancers include breast, ovarian, tubal, and peritoneal cancers. Having  family members with these cancers may be associated with an increased risk for harmful changes (mutations) in the breast cancer genes BRCA1 and BRCA2. Results of the assessment will determine the need for genetic counseling and BRCA1 and BRCA2 testing.  The Pap test is a screening test for cervical cancer. A Pap test can show cell changes on the cervix that might become cervical cancer if left untreated. A Pap test is a procedure in which cells are obtained and examined from the lower end of the uterus (cervix).  Women should have a Pap test starting at age 54.  Between ages 69 and 36, Pap tests should be repeated every 2 years.  Beginning at age 53, you should have a Pap test every 3 years as long as the past 3 Pap tests have been normal.  Some women have medical problems that increase the chance of getting cervical cancer. Talk to your health care provider about these problems. It is especially important to talk to your health care provider if a new  problem develops soon after your last Pap test. In these cases, your health care provider may recommend more frequent screening and Pap tests.  The above recommendations are the same for women who have or have not gotten the vaccine for human papillomavirus (HPV).  If you had a hysterectomy for a problem that was not cancer or a condition that could lead to cancer, then you no longer need Pap tests. Even if you no longer need a Pap test, a regular exam is a good idea to make sure no other problems are starting.  If you are between ages 77 and 43 years, and you have had normal Pap tests going back 10 years, you no longer need Pap tests. Even if you no longer need a Pap test, a regular exam is a good idea to make sure no other problems are starting.  If you have had past treatment for cervical cancer or a condition that could lead to cancer, you need Pap tests and screening for cancer for at least 20 years after your treatment.  If Pap tests have been  discontinued, risk factors (such as a new sexual partner) need to be reassessed to determine if screening should be resumed.  The HPV test is an additional test that may be used for cervical cancer screening. The HPV test looks for the virus that can cause the cell changes on the cervix. The cells collected during the Pap test can be tested for HPV. The HPV test could be used to screen women aged 93 years and older, and should be used in women of any age who have unclear Pap test results. After the age of 31, women should have HPV testing at the same frequency as a Pap test.  Colorectal cancer can be detected and often prevented. Most routine colorectal cancer screening begins at the age of 57 years and continues through age 53 years. However, your health care provider may recommend screening at an earlier age if you have risk factors for colon cancer. On a yearly basis, your health care provider may provide home test kits to check for hidden blood in the stool. Use of a small camera at the end of a tube, to directly examine the colon (sigmoidoscopy or colonoscopy), can detect the earliest forms of colorectal cancer. Talk to your health care provider about this at age 65, when routine screening begins. Direct exam of the colon should be repeated every 5 10 years through age 80 years, unless early forms of pre-cancerous polyps or small growths are found.  People who are at an increased risk for hepatitis B should be screened for this virus. You are considered at high risk for hepatitis B if:  You were born in a country where hepatitis B occurs often. Talk with your health care provider about which countries are considered high risk.  Your parents were born in a high-risk country and you have not received a shot to protect against hepatitis B (hepatitis B vaccine).  You have HIV or AIDS.  You use needles to inject street drugs.  You live with, or have sex with, someone who has Hepatitis B.  You get  hemodialysis treatment.  You take certain medicines for conditions like cancer, organ transplantation, and autoimmune conditions.  Hepatitis C blood testing is recommended for all people born from [birth year] through 1965 and any individual with known risks for hepatitis C.  Practice safe sex. Use condoms and avoid high-risk sexual practices to reduce the spread of sexually  transmitted infections (STIs). STIs include gonorrhea, chlamydia, syphilis, trichomonas, herpes, HPV, and human immunodeficiency virus (HIV). Herpes, HIV, and HPV are viral illnesses that have no cure. They can result in disability, cancer, and death. Sexually active women aged 23 years and younger should be checked for chlamydia. Older women with new or multiple partners should also be tested for chlamydia. Testing for other STIs is recommended if you are sexually active and at increased risk.  Osteoporosis is a disease in which the bones lose minerals and strength with aging. This can result in serious bone fractures or breaks. The risk of osteoporosis can be identified using a bone density scan. Women ages 7 years and over and women at risk for fractures or osteoporosis should discuss screening with their health care providers. Ask your health care provider whether you should take a calcium supplement or vitamin D to reduce the rate of osteoporosis.  Menopause can be associated with physical symptoms and risks. Hormone replacement therapy is available to decrease symptoms and risks. You should talk to your health care provider about whether hormone replacement therapy is right for you.  Use sunscreen. Apply sunscreen liberally and repeatedly throughout the day. You should seek shade when your shadow is shorter than you. Protect yourself by wearing long sleeves, pants, a wide-brimmed hat, and sunglasses year round, whenever you are outdoors.  Once a month, do a whole body skin exam, using a mirror to look at the skin on your back. Tell  your health care provider of new moles, moles that have irregular borders, moles that are larger than a pencil eraser, or moles that have changed in shape or color.  Stay current with required vaccines (immunizations).  Influenza vaccine. All adults should be immunized every year.  Tetanus, diphtheria, and acellular pertussis (Td, Tdap) vaccine. Pregnant women should receive 1 dose of Tdap vaccine during each pregnancy. The dose should be obtained regardless of the length of time since the last dose. Immunization is preferred during the 27th 36th week of gestation. An adult who has not previously received Tdap or who does not know her vaccine status should receive 1 dose of Tdap. This initial dose should be followed by tetanus and diphtheria toxoids (Td) booster doses every 10 years. Adults with an unknown or incomplete history of completing a 3-dose immunization series with Td-containing vaccines should begin or complete a primary immunization series including a Tdap dose. Adults should receive a Td booster every 10 years.  Varicella vaccine. An adult without evidence of immunity to varicella should receive 2 doses or a second dose if she has previously received 1 dose. Pregnant females who do not have evidence of immunity should receive the first dose after pregnancy. This first dose should be obtained before leaving the health care facility. The second dose should be obtained 4 8 weeks after the first dose.  Human papillomavirus (HPV) vaccine. Females aged 1 26 years who have not received the vaccine previously should obtain the 3-dose series. The vaccine is not recommended for use in pregnant females. However, pregnancy testing is not needed before receiving a dose. If a female is found to be pregnant after receiving a dose, no treatment is needed. In that case, the remaining doses should be delayed until after the pregnancy. Immunization is recommended for any person with an immunocompromised  condition through the age of 44 years if she did not get any or all doses earlier. During the 3-dose series, the second dose should be obtained 4 8  weeks after the first dose. The third dose should be obtained 24 weeks after the first dose and 16 weeks after the second dose.  Zoster vaccine. One dose is recommended for adults aged 31 years or older unless certain conditions are present.  Measles, mumps, and rubella (MMR) vaccine. Adults born before 3 generally are considered immune to measles and mumps. Adults born in [birth year] or later should have 1 or more doses of MMR vaccine unless there is a contraindication to the vaccine or there is laboratory evidence of immunity to each of the three diseases. A routine second dose of MMR vaccine should be obtained at least 28 days after the first dose for students attending postsecondary schools, health care workers, or international travelers. People who received inactivated measles vaccine or an unknown type of measles vaccine during 1963 1967 should receive 2 doses of MMR vaccine. People who received inactivated mumps vaccine or an unknown type of mumps vaccine before 1979 and are at high risk for mumps infection should consider immunization with 2 doses of MMR vaccine. For females of childbearing age, rubella immunity should be determined. If there is no evidence of immunity, females who are not pregnant should be vaccinated. If there is no evidence of immunity, females who are pregnant should delay immunization until after pregnancy. Unvaccinated health care workers born before [birth year] who lack laboratory evidence of measles, mumps, or rubella immunity or laboratory confirmation of disease should consider measles and mumps immunization with 2 doses of MMR vaccine or rubella immunization with 1 dose of MMR vaccine.  Pneumococcal 13-valent conjugate (PCV13) vaccine. When indicated, a person who is uncertain of her immunization history and has no record of immunization  should receive the PCV13 vaccine. An adult aged 13 years or older who has certain medical conditions and has not been previously immunized should receive 1 dose of PCV13 vaccine. This PCV13 should be followed with a dose of pneumococcal polysaccharide (PPSV23) vaccine. The PPSV23 vaccine dose should be obtained at least 8 weeks after the dose of PCV13 vaccine. An adult aged 17 years or older who has certain medical conditions and previously received 1 or more doses of PPSV23 vaccine should receive 1 dose of PCV13. The PCV13 vaccine dose should be obtained 1 or more years after the last PPSV23 vaccine dose.  Pneumococcal polysaccharide (PPSV23) vaccine. When PCV13 is also indicated, PCV13 should be obtained first. All adults aged 38 years and older should be immunized. An adult younger than age 21 years who has certain medical conditions should be immunized. Any person who resides in a nursing home or long-term care facility should be immunized. An adult smoker should be immunized. People with an immunocompromised condition and certain other conditions should receive both PCV13 and PPSV23 vaccines. People with human immunodeficiency virus (HIV) infection should be immunized as soon as possible after diagnosis. Immunization during chemotherapy or radiation therapy should be avoided. Routine use of PPSV23 vaccine is not recommended for American Indians, [place] Natives, or people younger than 65 years unless there are medical conditions that require PPSV23 vaccine. When indicated, people who have unknown immunization and have no record of immunization should receive PPSV23 vaccine. One-time revaccination 5 years after the first dose of PPSV23 is recommended for people aged 85 64 years who have chronic kidney failure, nephrotic syndrome, asplenia, or immunocompromised conditions. People who received 1 2 doses of PPSV23 before age 1 years should receive another dose of PPSV23 vaccine at age 61 years or later if at  least 5 years have passed since the previous dose. Doses of PPSV23 are not needed for people immunized with PPSV23 at or after age 85 years.  Meningococcal vaccine. Adults with asplenia or persistent complement component deficiencies should receive 2 doses of quadrivalent meningococcal conjugate (MenACWY-D) vaccine. The doses should be obtained at least 2 months apart. Microbiologists working with certain meningococcal bacteria, [employer] recruits, people at risk during an outbreak, and people who travel to or live in countries with a high rate of meningitis should be immunized. A first-year college student up through age 72 years who is living in a residence hall should receive a dose if she did not receive a dose on or after her 16th birthday. Adults who have certain high-risk conditions should receive one or more doses of vaccine.  Hepatitis A vaccine. Adults who wish to be protected from this disease, have certain high-risk conditions, work with hepatitis A-infected animals, work in hepatitis A research labs, or travel to or work in countries with a high rate of hepatitis A should be immunized. Adults who were previously unvaccinated and who anticipate close contact with an international adoptee during the first 60 days after arrival in the [place] from a country with a high rate of hepatitis A should be immunized.  Hepatitis B vaccine. Adults who wish to be protected from this disease, have certain high-risk conditions, may be exposed to blood or other infectious body fluids, are household contacts or sex partners of hepatitis B positive people, are clients or workers in certain care facilities, or travel to or work in countries with a high rate of hepatitis B should be immunized.  Haemophilus influenzae type b (Hib) vaccine. A previously unvaccinated person with asplenia or sickle cell disease or having a scheduled splenectomy should receive 1 dose of Hib vaccine. Regardless of previous  immunization, a recipient of a hematopoietic stem cell transplant should receive a 3-dose series 6 12 months after her successful transplant. Hib vaccine is not recommended for adults with HIV infection. Preventive Services / Frequency Ages 49 to 39years  Blood pressure check.** / Every 1 to 2 years.  Lipid and cholesterol check.** / Every 5 years beginning at age 71.  Clinical breast exam.** / Every 3 years for women in their 45s and 30s.  BRCA-related cancer risk assessment.** / For women who have family members with a BRCA-related cancer (breast, ovarian, tubal, or peritoneal cancers).  Pap test.** / Every 2 years from ages 12 through 25. Every 3 years starting at age 4 through age 42 or 83 with a history of 3 consecutive normal Pap tests.  HPV screening.** / Every 3 years from ages 57 through ages 20 to 100 with a history of 3 consecutive normal Pap tests.  Hepatitis C blood test.** / For any individual with known risks for hepatitis C.  Skin self-exam. / Monthly.  Influenza vaccine. / Every year.  Tetanus, diphtheria, and acellular pertussis (Tdap, Td) vaccine.** / Consult your health care provider. Pregnant women should receive 1 dose of Tdap vaccine during each pregnancy. 1 dose of Td every 10 years.  Varicella vaccine.** / Consult your health care provider. Pregnant females who do not have evidence of immunity should receive the first dose after pregnancy.  HPV vaccine. / 3 doses over 6 months, if 79 and younger. The vaccine is not recommended for use in pregnant females. However, pregnancy testing is not needed before receiving a dose.  Measles, mumps, rubella (MMR) vaccine.** / You need at least  1 dose of MMR if you were born in 1957 or later. You may also need a 2nd dose. For females of childbearing age, rubella immunity should be determined. If there is no evidence of immunity, females who are not pregnant should be vaccinated. If there is no evidence of immunity, females who  are pregnant should delay immunization until after pregnancy.  Pneumococcal 13-valent conjugate (PCV13) vaccine.** / Consult your health care provider.  Pneumococcal polysaccharide (PPSV23) vaccine.** / 1 to 2 doses if you smoke cigarettes or if you have certain conditions.  Meningococcal vaccine.** / 1 dose if you are age 45 to 47 years and a Market researcher living in a residence hall, or have one of several medical conditions, you need to get vaccinated against meningococcal disease. You may also need additional booster doses.  Hepatitis A vaccine.** / Consult your health care provider.  Hepatitis B vaccine.** / Consult your health care provider.  Haemophilus influenzae type b (Hib) vaccine.** / Consult your health care provider. Ages 79 to 64years  Blood pressure check.** / Every 1 to 2 years.  Lipid and cholesterol check.** / Every 5 years beginning at age 72 years.  Lung cancer screening. / Every year if you are aged 59 80 years and have a 30-pack-year history of smoking and currently smoke or have quit within the past 15 years. Yearly screening is stopped once you have quit smoking for at least 15 years or develop a health problem that would prevent you from having lung cancer treatment.  Clinical breast exam.** / Every year after age 18 years.  BRCA-related cancer risk assessment.** / For women who have family members with a BRCA-related cancer (breast, ovarian, tubal, or peritoneal cancers).  Mammogram.** / Every year beginning at age 54 years and continuing for as long as you are in good health. Consult with your health care provider.  Pap test.** / Every 3 years starting at age 72 years through age 64 or 64 years with a history of 3 consecutive normal Pap tests.  HPV screening.** / Every 3 years from ages 27 years through ages 82 to 89 years with a history of 3 consecutive normal Pap tests.  Fecal occult blood test (FOBT) of stool. / Every year beginning at age 80  years and continuing until age 43 years. You may not need to do this test if you get a colonoscopy every 10 years.  Flexible sigmoidoscopy or colonoscopy.** / Every 5 years for a flexible sigmoidoscopy or every 10 years for a colonoscopy beginning at age 73 years and continuing until age 65 years.  Hepatitis C blood test.** / For all people born from [birth year] through 1963 and any individual with known risks for hepatitis C.  Skin self-exam. / Monthly.  Influenza vaccine. / Every year.  Tetanus, diphtheria, and acellular pertussis (Tdap/Td) vaccine.** / Consult your health care provider. Pregnant women should receive 1 dose of Tdap vaccine during each pregnancy. 1 dose of Td every 10 years.  Varicella vaccine.** / Consult your health care provider. Pregnant females who do not have evidence of immunity should receive the first dose after pregnancy.  Zoster vaccine.** / 1 dose for adults aged 18 years or older.  Measles, mumps, rubella (MMR) vaccine.** / You need at least 1 dose of MMR if you were born in 1957 or later. You may also need a 2nd dose. For females of childbearing age, rubella immunity should be determined. If there is no evidence of immunity, females who are not  pregnant should be vaccinated. If there is no evidence of immunity, females who are pregnant should delay immunization until after pregnancy.  Pneumococcal 13-valent conjugate (PCV13) vaccine.** / Consult your health care provider.  Pneumococcal polysaccharide (PPSV23) vaccine.** / 1 to 2 doses if you smoke cigarettes or if you have certain conditions.  Meningococcal vaccine.** / Consult your health care provider.  Hepatitis A vaccine.** / Consult your health care provider.  Hepatitis B vaccine.** / Consult your health care provider.  Haemophilus influenzae type b (Hib) vaccine.** / Consult your health care provider. Ages 38 years and over  Blood pressure check.** / Every 1 to 2 years.  Lipid and cholesterol check.** /  Every 5 years beginning at age 22 years.  Lung cancer screening. / Every year if you are aged 71 80 years and have a 30-pack-year history of smoking and currently smoke or have quit within the past 15 years. Yearly screening is stopped once you have quit smoking for at least 15 years or develop a health problem that would prevent you from having lung cancer treatment.  Clinical breast exam.** / Every year after age 66 years.  BRCA-related cancer risk assessment.** / For women who have family members with a BRCA-related cancer (breast, ovarian, tubal, or peritoneal cancers).  Mammogram.** / Every year beginning at age 56 years and continuing for as long as you are in good health. Consult with your health care provider.  Pap test.** / Every 3 years starting at age 60 years through age 38 or 58 years with 3 consecutive normal Pap tests. Testing can be stopped between 65 and 70 years with 3 consecutive normal Pap tests and no abnormal Pap or HPV tests in the past 10 years.  HPV screening.** / Every 3 years from ages 57 years through ages 51 or 21 years with a history of 3 consecutive normal Pap tests. Testing can be stopped between 65 and 70 years with 3 consecutive normal Pap tests and no abnormal Pap or HPV tests in the past 10 years.  Fecal occult blood test (FOBT) of stool. / Every year beginning at age 7 years and continuing until age 19 years. You may not need to do this test if you get a colonoscopy every 10 years.  Flexible sigmoidoscopy or colonoscopy.** / Every 5 years for a flexible sigmoidoscopy or every 10 years for a colonoscopy beginning at age 64 years and continuing until age 50 years.  Hepatitis C blood test.** / For all people born from [birth year] through 1963 and any individual with known risks for hepatitis C.  Osteoporosis screening.** / A one-time screening for women ages 82 years and over and women at risk for fractures or osteoporosis.  Skin self-exam. / Monthly.  Influenza  vaccine. / Every year.  Tetanus, diphtheria, and acellular pertussis (Tdap/Td) vaccine.** / 1 dose of Td every 10 years.  Varicella vaccine.** / Consult your health care provider.  Zoster vaccine.** / 1 dose for adults aged 40 years or older.  Pneumococcal 13-valent conjugate (PCV13) vaccine.** / Consult your health care provider.  Pneumococcal polysaccharide (PPSV23) vaccine.** / 1 dose for all adults aged 1 years and older.  Meningococcal vaccine.** / Consult your health care provider.  Hepatitis A vaccine.** / Consult your health care provider.  Hepatitis B vaccine.** / Consult your health care provider.  Haemophilus influenzae type b (Hib) vaccine.** / Consult your health care provider. ** Family history and personal history of risk and conditions may change your health care provider's recommendations.  Document Released: 05/26/2001 Document Revised: 01/18/2013 Document Reviewed: 08/25/2010 [facility] Patient Information 2014 [place], [place].

## 2013-09-07 NOTE — Telephone Encounter (Signed)
Medication sent to pharmacy on 05.19.15; pt in for OV on 05.28.15/SLS

## 2013-09-07 NOTE — Progress Notes (Signed)
Patient presents to clinic today for annual exam.  Patient is fasting for labs.  Acute Concerns: Fatigue -- Patient complains of intermittent fatigue that has been present for several months.  Denies increased stress.  Denies hx of anemia.  Denies shortness of breath with exertion.  Endorses restful sleep at night.   Chronic Issues: GERD -- Patient doing well on Nexium  Seasonal Allergies -- Doing well on daily zyrtec.  Health Maintenance: Dental -- UTD Vision -- UTD Immunizations -- UTD Colonoscopy -- 2014; due in 2024. Mammogram -- Followed by OB/GYN -- Dr. Ruthann Cancer.  Needs mammogram. Will place order.  PAP -- s/p hysterectomy  Past Medical History  Diagnosis Date  . Asthma     mild, intermittent  . Frequent urination   . GERD (gastroesophageal reflux disease)   . Headache(784.0)   . PONV (postoperative nausea and vomiting)     difficulty waking up  . Arthritis   . Migraine   . Chicken pox   . Fibroids   . Fibromyalgia   . Anemia   . Problems related to high-risk sexual behavior     Unprotected sex  . CTS (carpal tunnel syndrome)   . Cholelithiasis   . Symptomatic menopausal or female climacteric states   . Allergic rhinitis due to other allergen     Past Surgical History  Procedure Laterality Date  . Tubal ligation    . Abdominal hysterectomy  2008    partial, ovaries remain  . Laser ablation condyloma cervical / vulvar    . Cholecystectomy  05/19/2011    Procedure: LAPAROSCOPIC CHOLECYSTECTOMY;  Surgeon: Zenovia Jarred, MD;  Location: [place];  Service: General;  Laterality: N/A;  . Percutaneous pinning  01/22/2012    Procedure: PERCUTANEOUS PINNING EXTREMITY;  Surgeon: Marybelle Killings, MD;  Location: [place];  Service: Orthopedics;  Laterality: Right;  Closed Reduction and Pinning right 5th Metacarpal Fracture  . Wisdom tooth extraction      Current Outpatient Prescriptions on File Prior to Visit  Medication Sig Dispense Refill  . albuterol (PROVENTIL  HFA;VENTOLIN HFA) 108 (90 BASE) MCG/ACT inhaler Inhale 1-2 puffs into the lungs every 6 (six) hours as needed for wheezing or shortness of breath.      . Biotin 1000 MCG tablet Take 1,000 mcg by mouth daily.      . cyclobenzaprine (FLEXERIL) 10 MG tablet TAKE 1 TABLET BY MOUTH THREE TIMES DAILY AS NEEDED FOR PAIN  30 tablet  0  . esomeprazole (NEXIUM) 40 MG capsule TAKE 1 CAPSULE BY MOUTH EVERY MORNING BEFORE BEAKFEST  90 capsule  0  . Ginkgo Biloba 40 MG TABS Take by mouth. W/Ginseng; liquid vials      . lubiprostone (AMITIZA) 24 MCG capsule Take 1 capsule (24 mcg total) by mouth daily with breakfast.  30 capsule  3  . Multiple Vitamins-Minerals (MULTIVITAMIN WITH MINERALS) tablet Take 1 tablet by mouth daily. Daily MVI w/Iron; Alternate w/Prenatal MVI      . NON FORMULARY Take 3 tablets by mouth as needed. Swiss Kriss Herbal Laxative Tab      . OVER THE COUNTER MEDICATION Take 2 drops by mouth daily. VITAMIN D 1000 UNITS WITH VITAMIN A 250 UNITS      . RA GARLIC/PARSLEY PO Take 3 capsules by mouth daily. Garlic 1.2 mg/Parsley 2 mg      . vitamin E 400 UNIT capsule Take 400 Units by mouth daily.      Marland Kitchen zonisamide (ZONEGRAN) 25 MG capsule TAKE 1  CAPSULE BY MOUTH DAILY AS NEEDED      . [DISCONTINUED] phentermine 37.5 MG capsule Take 37.5 mg by mouth every morning.       No current facility-administered medications on file prior to visit.    Allergies  Allergen Reactions  . Iodine Anaphylaxis  . Shellfish Allergy Anaphylaxis  . Latex Rash    Family History  Problem Relation Age of Onset  . Hypertension Father     Deceased  . Stroke Father   . Diabetes Father     Legs Amputated  . Heart disease Father   . Arthritis Father   . Diabetes Mother     Deceased  . Hypertension Mother   . Anemia Mother   . Hyperlipidemia Mother   . Arthritis/Rheumatoid Mother   . Migraines Mother   . Heart attack Maternal Aunt   . Diabetes Maternal Uncle   . Migraines Maternal Aunt   . Asthma Maternal  Aunt   . Asthma Maternal Uncle   . Stomach cancer Sister   . Breast cancer Sister   . Uterine cancer Sister     or Ovarian  . Sleep apnea Brother   . COPD Brother   . Asthma Sister     x2  . Sudden death Brother     SIDS  . Hypertension Brother   . Diabetes Sister   . Asthma Daughter     x2  . Allergies Daughter   . Sickle cell trait Other     All Children  . Atrial fibrillation Daughter   . Anxiety disorder Daughter   . Allergies Son   . Cancer Father   . CVA Mother   . Hypertension Sister   . Heart disease Sister   . Asthma Brother     History   Social History  . Marital Status: Single    Spouse Name: N/A    Number of Children: N/A  . Years of Education: N/A   Occupational History  . Not on file.   Social History Main Topics  . Smoking status: Never Smoker   . Smokeless tobacco: Never Used  . Alcohol Use: Yes     Comment: rarely   . Drug Use: No  . Sexual Activity: Not on file   Other Topics Concern  . Not on file   Social History Narrative  . No narrative on file   Review of Systems  Constitutional: Positive for malaise/fatigue. Negative for fever, chills, weight loss and diaphoresis.  HENT: Negative for ear discharge, hearing loss and tinnitus.   Eyes: Negative for blurred vision, double vision, photophobia and pain.  Respiratory: Negative for cough and shortness of breath.   Cardiovascular: Negative for chest pain and palpitations.  Gastrointestinal: Positive for heartburn. Negative for nausea, vomiting, abdominal pain, diarrhea, constipation, blood in stool and melena.  Genitourinary: Negative for dysuria, urgency, frequency, hematuria and flank pain.  Skin: Negative for rash.  Neurological: Negative for dizziness, loss of consciousness, weakness and headaches.  Endo/Heme/Allergies: Positive for environmental allergies.  Psychiatric/Behavioral: Negative for depression, suicidal ideas, hallucinations and substance abuse. The patient is not  nervous/anxious and does not have insomnia.    BP 104/80  Pulse 72  Temp(Src) 98.2 F (36.8 C) (Oral)  Resp 16  Ht 5\' 7"  (1.702 m)  Wt 227 lb 12 oz (103.307 kg)  BMI 35.66 kg/m2  SpO2 98%  Physical Exam  Vitals reviewed. Constitutional: She is oriented to person, place, and time and well-developed, well-nourished, and in no distress.  HENT:  Head: Normocephalic and atraumatic.  Right Ear: External ear normal.  Left Ear: External ear normal.  Nose: Nose normal.  Mouth/Throat: Oropharynx is clear and moist. No oropharyngeal exudate.  TM within normal limits bilaterally.  Eyes: Conjunctivae are normal. Pupils are equal, round, and reactive to light.  Neck: Neck supple.  Cardiovascular: Normal rate, regular rhythm, normal heart sounds and intact distal pulses.   Pulmonary/Chest: Effort normal and breath sounds normal. No respiratory distress. She has no wheezes. She has no rales. She exhibits no tenderness.  Abdominal: Soft. Bowel sounds are normal. She exhibits no distension and no mass. There is no tenderness. There is no rebound and no guarding.  Neurological: She is alert and oriented to person, place, and time.  Skin: Skin is warm and dry. No rash noted.  Psychiatric: Affect normal.   Assessment/Plan: Seasonal allergies Well-controlled.  Continue current regimen.  GERD (gastroesophageal reflux disease) Well-controlled.  Continue current regimen.  Visit for preventive health examination Due for mammogram. [facility]. Will obtain fasting labs.   Fatigue Will obtain labs to further assess fatigue. Encourage increased physical activity.

## 2013-09-07 NOTE — Progress Notes (Signed)
Pre visit review using our clinic review tool, if applicable. No additional management support is needed unless otherwise documented below in the visit note/SLS  

## 2013-09-08 LAB — URINALYSIS, ROUTINE W REFLEX MICROSCOPIC
Bilirubin Urine: NEGATIVE
Glucose, UA: NEGATIVE mg/dL
Hgb urine dipstick: NEGATIVE
Ketones, ur: NEGATIVE mg/dL
LEUKOCYTES UA: NEGATIVE
Nitrite: NEGATIVE
PH: 6 (ref 5.0–8.0)
Protein, ur: NEGATIVE mg/dL
SPECIFIC GRAVITY, URINE: 1.02 (ref 1.005–1.030)
UROBILINOGEN UA: 0.2 mg/dL (ref 0.0–1.0)

## 2013-09-08 LAB — SEDIMENTATION RATE: SED RATE: 1 mm/h (ref 0–22)

## 2013-09-10 DIAGNOSIS — R5383 Other fatigue: Secondary | ICD-10-CM | POA: Insufficient documentation

## 2013-09-10 DIAGNOSIS — Z0000 Encounter for general adult medical examination without abnormal findings: Secondary | ICD-10-CM | POA: Insufficient documentation

## 2013-09-10 NOTE — Assessment & Plan Note (Signed)
Well-controlled.  Continue current regimen. 

## 2013-09-10 NOTE — Assessment & Plan Note (Signed)
Due for mammogram. [facility]. Will obtain fasting labs.

## 2013-09-10 NOTE — Assessment & Plan Note (Signed)
Will obtain labs to further assess fatigue. Encourage increased physical activity.

## 2013-09-11 ENCOUNTER — Telehealth: Payer: Self-pay | Admitting: Physician Assistant

## 2013-09-11 DIAGNOSIS — R7303 Prediabetes: Secondary | ICD-10-CM

## 2013-09-11 NOTE — Telephone Encounter (Signed)
Message copied by Raiford Noble on Mon Sep 11, 2013 11:22 AM ------      Message from: Rockwell Germany      Created: Mon Sep 11, 2013 11:20 AM      Regarding: Referral       Patient informed, understood & agreed; Ok to place referral for Nutritionist per patient/SLS            ----- Message -----         From: Leeanne Rio, PA-C         Sent: 09/08/2013   7:13 AM           To: Rockwell Germany, CMA            Labs look good overall. Her A1C is at 6.5 which is right at the border between prediabetes and diabetes.  It is very important that she limit intake of carbohydrates and refined sugars.  It is also important that she eat a healthy meal or snack every 3-4 hours to keep her metabolism running high and her sugar levels stable.  I would like for her to consider letting me set her up with a nutritionist.       ------

## 2013-09-25 ENCOUNTER — Encounter: Payer: Self-pay | Admitting: *Deleted

## 2013-09-25 ENCOUNTER — Encounter: Payer: Federal, State, Local not specified - PPO | Attending: Physician Assistant | Admitting: *Deleted

## 2013-09-25 VITALS — Ht 67.0 in | Wt 228.2 lb

## 2013-09-25 DIAGNOSIS — Z713 Dietary counseling and surveillance: Secondary | ICD-10-CM | POA: Insufficient documentation

## 2013-09-25 DIAGNOSIS — R7303 Prediabetes: Secondary | ICD-10-CM

## 2013-09-25 DIAGNOSIS — R7309 Other abnormal glucose: Secondary | ICD-10-CM | POA: Insufficient documentation

## 2013-09-25 NOTE — Progress Notes (Signed)
Medical Nutrition Therapy:  Appt start time: 0830 end time:  0930.  Assessment:  Patient here today for pre-diabetes. Recent A1c 6.5. Patient reports efforts to try to lose weight for the last 2 months. However, she has been unable to do so. She is exercising at least 60-90 minutes daily, walking at home and doing cardio/resistance training at the gym. She has also cut out meat and most starches, eating mainly fruits and vegetables. I suspect diet is excessively low in calories and not meeting protein needs, which is hindering her weight loss efforts. She would like to continue with vegetarian eating. We discussed good plant sources of protein.   TANITA  BODY COMP RESULTS    % Fat Mass 47.2%   Fat Mass (lbs) 108.5   Fat Free Mass (lbs) 121.0   Total Body Water (lbs) 88.5    MEDICATIONS: See list   DIETARY INTAKE:   Usual eating pattern includes 3 meals and 3 snacks per day.  24-hr recall:  B ( AM): Oatmeal with raisins, walnut OR stirfry  Snk ( AM): None  L ( PM): Stirfy OR vegetables (broccoli, vegetable medley) with butter, salt, pepper OR can of dry beans Snk ( PM): Fruit (usually citrus, 2 servings), carrots with light ranch, pretzels, protein bar (snacks on these throughout the afternoon) D ( PM): Mixed vegetables Snk ( PM): None Beverages: Water with lemon, Iso Tea  Usual physical activity: Walking 60 minutes most mornings, 25-30 minutes cardio and upper body resistance training at gym daily  Estimated energy needs: 1200-1500 calories 135-170 g carbohydrates 75-94 g protein 40-50 g fat  Progress Towards Goal(s):  In progress.   Nutritional Diagnosis:  NB-1.1 Food and nutrition-related knowledge deficit As related to prediabetes.  As evidenced by no prior education.    Intervention:  Nutrition counseling. We discussed basic carb counting, including foods with carbs, label reading, portion size, and meal planning. We also discussed strategies for weight loss, including  balancing nutrients (carbs, protein, fat), portion control, healthy snacks, and exercise.   Goals:  1. 2-3 carb servings at meals, 1 serving at snacks 2. Balance nutrients at meals, including a good source of protein and moderate portion of carbohydrates.  3. Continue eating at least 5 servings of fruits and vegetables daily.  4. Continue exercising as currently  Handouts given during visit include:  Carbohydrate counting  Meal plan card  Monitoring/Evaluation:  Dietary intake, exercise, blood glucose, and body weight in 1 month(s).

## 2013-09-27 ENCOUNTER — Ambulatory Visit (INDEPENDENT_AMBULATORY_CARE_PROVIDER_SITE_OTHER): Payer: Federal, State, Local not specified - PPO | Admitting: Family Medicine

## 2013-09-27 VITALS — BP 121/74 | HR 70 | Temp 98.3°F

## 2013-09-27 DIAGNOSIS — G43909 Migraine, unspecified, not intractable, without status migrainosus: Secondary | ICD-10-CM

## 2013-09-28 ENCOUNTER — Encounter: Payer: Self-pay | Admitting: Family Medicine

## 2013-09-28 DIAGNOSIS — G43909 Migraine, unspecified, not intractable, without status migrainosus: Secondary | ICD-10-CM | POA: Insufficient documentation

## 2013-09-28 MED ORDER — KETOROLAC TROMETHAMINE 60 MG/2ML IM SOLN
60.0000 mg | Freq: Once | INTRAMUSCULAR | Status: AC
  Administered 2013-09-28: 60 mg via INTRAMUSCULAR

## 2013-09-28 MED ORDER — PROMETHAZINE HCL 50 MG/ML IJ SOLN
50.0000 mg | Freq: Once | INTRAMUSCULAR | Status: AC
  Administered 2013-09-28: 50 mg via INTRAMUSCULAR

## 2013-09-28 NOTE — Progress Notes (Signed)
   Subjective:    Patient ID: Natalie Vazquez, female    DOB: 1960/07/27, 53 y.o.   MRN: [id number]  HPI Here for the onset this am of a severe global headache which she feels is a migraine. She is averaging a migraine every 4-6 weeks. Usually Motrin helps but not today. The headache began in the back of her head as usual and then generalized. She is nauseated but has not vomited. No blurred vision.    Review of Systems  Constitutional: Negative.   Eyes: Positive for photophobia. Negative for visual disturbance.  Respiratory: Negative.   Cardiovascular: Negative.   Neurological: Positive for headaches. Negative for dizziness, tremors, seizures, syncope, facial asymmetry, speech difficulty, weakness, light-headedness and numbness.       Objective:   Physical Exam  Constitutional: She is oriented to person, place, and time.  In pain, alert  HENT:  Head: Normocephalic and atraumatic.  Eyes: Conjunctivae and EOM are normal. Pupils are equal, round, and reactive to light.  Neck: Neck supple.  Cardiovascular: Normal rate, regular rhythm, normal heart sounds and intact distal pulses.   Pulmonary/Chest: Effort normal and breath sounds normal.  Neurological: She is alert and oriented to person, place, and time. She has normal reflexes. No cranial nerve deficit. She exhibits normal muscle tone. Coordination normal.          Assessment & Plan:  Acute migraine. Given injections of Toradol and Phenergan. Wrote for Imitrex to try the next time she gets a migraine

## 2013-09-28 NOTE — Addendum Note (Signed)
Addended by: Aggie Hacker A on: 09/28/2013 08:32 AM   Modules accepted: Orders

## 2013-10-24 ENCOUNTER — Encounter (HOSPITAL_COMMUNITY): Payer: Self-pay | Admitting: Emergency Medicine

## 2013-10-24 ENCOUNTER — Emergency Department (HOSPITAL_COMMUNITY)
Admission: EM | Admit: 2013-10-24 | Discharge: 2013-10-24 | Disposition: A | Discharge status: Home / Self Care | Payer: Federal, State, Local not specified - PPO | Source: Home / Self Care | Attending: Family Medicine | Admitting: Family Medicine

## 2013-10-24 DIAGNOSIS — S139XXA Sprain of joints and ligaments of unspecified parts of neck, initial encounter: Secondary | ICD-10-CM

## 2013-10-24 DIAGNOSIS — S161XXA Strain of muscle, fascia and tendon at neck level, initial encounter: Secondary | ICD-10-CM

## 2013-10-24 DIAGNOSIS — X58XXXA Exposure to other specified factors, initial encounter: Secondary | ICD-10-CM

## 2013-10-24 MED ORDER — NAPROXEN 500 MG PO TABS: 500.0000 mg | ORAL_TABLET | Freq: Two times a day (BID) | ORAL | Status: DC

## 2013-10-24 MED ORDER — KETOROLAC TROMETHAMINE 30 MG/ML IJ SOLN
30.0000 mg | Freq: Once | INTRAMUSCULAR | Status: AC
  Administered 2013-10-24: 30 mg via INTRAMUSCULAR

## 2013-10-24 MED ORDER — KETOROLAC TROMETHAMINE 30 MG/ML IJ SOLN
INTRAMUSCULAR | Status: AC
  Filled 2013-10-24: qty 1

## 2013-10-24 NOTE — ED Provider Notes (Signed)
CSN: [id number]     Arrival date & time 10/24/13  1955 History   First MD Initiated Contact with Patient 10/24/13 2042     Chief Complaint  Patient presents with  . Neck Pain   (Consider location/radiation/quality/duration/timing/severity/associated sxs/prior Treatment) HPI Comments: Patient reports bilateral lateral neck discomfort following water aerobic class today. Denies known injury and endorses that this is not a new exercise program for her. Attends class several days a week. States neck has become progressively stiff and sore over the course of the day. While she has taken two doses of Flexeril 5 mg, she has not taken anything for pain. Presents for evaluation. Denies fever or headache. Denies previous episodes, but does not elaborate as to why she has a prescription for Flexeril.  No changes on strength or sensation of upper extremities. No difficulty breathing, speaking or swallowing.   Patient is a 53 y.o. female presenting with neck pain. The history is provided by the patient.  Neck Pain   Past Medical History  Diagnosis Date  . Asthma     mild, intermittent  . Frequent urination   . GERD (gastroesophageal reflux disease)   . Headache(784.0)   . PONV (postoperative nausea and vomiting)     difficulty waking up  . Arthritis   . Migraine   . Chicken pox   . Fibroids   . Fibromyalgia   . Anemia   . Problems related to high-risk sexual behavior     Unprotected sex  . CTS (carpal tunnel syndrome)   . Cholelithiasis   . Symptomatic menopausal or female climacteric states   . Allergic rhinitis due to other allergen    Past Surgical History  Procedure Laterality Date  . Tubal ligation    . Abdominal hysterectomy  2008    partial, ovaries remain  . Laser ablation condyloma cervical / vulvar    . Cholecystectomy  05/19/2011    Procedure: LAPAROSCOPIC CHOLECYSTECTOMY;  Surgeon: Zenovia Jarred, MD;  Location: [facility];  Service: General;  Laterality: N/A;  . Percutaneous  pinning  01/22/2012    Procedure: PERCUTANEOUS PINNING EXTREMITY;  Surgeon: Marybelle Killings, MD;  Location: [facility];  Service: Orthopedics;  Laterality: Right;  Closed Reduction and Pinning right 5th Metacarpal Fracture  . Wisdom tooth extraction    . Carpal tunnel release Right 2015   Family History  Problem Relation Age of Onset  . Hypertension Father     Deceased  . Stroke Father   . Diabetes Father     Legs Amputated  . Heart disease Father   . Arthritis Father   . Diabetes Mother     Deceased  . Hypertension Mother   . Anemia Mother   . Hyperlipidemia Mother   . Arthritis/Rheumatoid Mother   . Migraines Mother   . Heart attack Maternal Aunt   . Diabetes Maternal Uncle   . Migraines Maternal Aunt   . Asthma Maternal Aunt   . Asthma Maternal Uncle   . Stomach cancer Sister   . Breast cancer Sister   . Uterine cancer Sister     or Ovarian  . Sleep apnea Brother   . COPD Brother   . Asthma Sister     x2  . Sudden death Brother     SIDS  . Hypertension Brother   . Diabetes Sister   . Asthma Daughter     x2  . Allergies Daughter   . Sickle cell trait Other     All  Children  . Atrial fibrillation Daughter   . Anxiety disorder Daughter   . Allergies Son   . Cancer Father   . CVA Mother   . Hypertension Sister   . Heart disease Sister   . Asthma Brother    History  Substance Use Topics  . Smoking status: Never Smoker   . Smokeless tobacco: Never Used  . Alcohol Use: Yes     Comment: rarely    OB History   Grav Para Term Preterm Abortions TAB SAB Ect Mult Living                 Review of Systems  Musculoskeletal: Positive for neck pain.  All other systems reviewed and are negative.   Allergies  Iodine; Shellfish allergy; and Latex  Home Medications   Prior to Admission medications   Medication Sig Start Date End Date Taking? Authorizing Provider  Biotin 1000 MCG tablet Take 1,000 mcg by mouth daily.   Yes Historical Provider, MD  cetirizine (ZYRTEC)  10 MG tablet Take 10 mg by mouth daily. 03/16/13  Yes Leeanne Rio, PA-C  cyclobenzaprine (FLEXERIL) 10 MG tablet TAKE 1 TABLET BY MOUTH THREE TIMES DAILY AS NEEDED FOR PAIN   Yes Leeanne Rio, PA-C  esomeprazole (NEXIUM) 40 MG capsule TAKE 1 CAPSULE BY MOUTH EVERY MORNING BEFORE BEAKFEST 08/31/13  Yes Leeanne Rio, PA-C  Ginkgo Biloba 40 MG TABS Take by mouth. W/Ginseng; liquid vials   Yes Historical Provider, MD  lubiprostone (AMITIZA) 24 MCG capsule Take 1 capsule (24 mcg total) by mouth daily with breakfast. 08/29/13  Yes Leeanne Rio, PA-C  Multiple Vitamins-Minerals (MULTIVITAMIN WITH MINERALS) tablet Take 1 tablet by mouth daily. Daily MVI w/Iron; Alternate w/Prenatal MVI   Yes Historical Provider, MD  OVER THE COUNTER MEDICATION Take 2 drops by mouth daily. VITAMIN D 1000 UNITS WITH VITAMIN A 250 UNITS   Yes Historical Provider, MD  RA GARLIC/PARSLEY PO Take 3 capsules by mouth daily. Garlic 1.2 mg/Parsley 2 mg   Yes Historical Provider, MD  vitamin E 400 UNIT capsule Take 400 Units by mouth daily.   Yes Historical Provider, MD  zonisamide (ZONEGRAN) 25 MG capsule TAKE 1 CAPSULE BY MOUTH DAILY AS NEEDED 03/16/13  Yes Leeanne Rio, PA-C  albuterol (PROVENTIL HFA;VENTOLIN HFA) 108 (90 BASE) MCG/ACT inhaler Inhale 1-2 puffs into the lungs every 6 (six) hours as needed for wheezing or shortness of breath.    Historical Provider, MD  naproxen (NAPROSYN) 500 MG tablet Take 1 tablet (500 mg total) by mouth 2 (two) times daily. As needed for neck pain 10/24/13   Annett Gula [place], PA  NON FORMULARY Take 3 tablets by mouth as needed. Swiss Kriss Herbal Laxative Tab    Historical Provider, MD   BP 130/88  Pulse 76  Temp(Src) 98.4 F (36.9 C) (Oral)  SpO2 99% Physical Exam  Nursing note and vitals reviewed. Constitutional: She is oriented to person, place, and time. She appears well-developed and well-nourished. No distress.  HENT:  Head: Normocephalic and  atraumatic.  Eyes: Conjunctivae are normal. No scleral icterus.  Neck: Trachea normal, normal range of motion, full passive range of motion without pain and phonation normal. Neck supple. Muscular tenderness present. No tracheal tenderness and no spinous process tenderness present. No rigidity. No tracheal deviation, no edema, no erythema and normal range of motion present.  Patient moves very slowly when asked to perform ROM of her neck but is able to complete full ROM.  Tenderness along bilateral sternocleidomastoid muscles and bilateral trapezius muscles.    Cardiovascular: Normal rate, regular rhythm and normal heart sounds.   Pulmonary/Chest: Effort normal and breath sounds normal. No stridor.  Musculoskeletal: Normal range of motion.  CSM exam of bilateral upper extremities is normal.   Neurological: She is alert and oriented to person, place, and time.  Skin: Skin is warm and dry. No rash noted. No erythema.  Psychiatric: She has a normal mood and affect. Her behavior is normal.    ED Course  Procedures (including critical care time) Labs Review Labs Reviewed - No data to display  Imaging Review No results found.   MDM   1. Cervical strain, initial encounter   Patient without midline tenderness and also without focal sensory, motor or neurological deficit on exam. Suspect mild overuse and strain from exercise class. Will add ibuprofen to treatment plan and advise she may continue to use Flexeril as prescribed by her PCP for muscle spasms. Suggested she take 3-5 days off from exercise program and follow up with her PCP if symptoms do not improve. Patient given 30mg  IM toradol at [facility] for pain.    [place], [place] 10/24/13 2129

## 2013-10-24 NOTE — Discharge Instructions (Signed)
You may continue to use flexeril as prescribed by your doctor and take the naprosyn as directed for pain. I would not return to water aerobics for at least 3-4 days and only if your symptoms have improved.  If symptoms do not improve over the next 2-3 days please follow up with your doctor.  Cervical Sprain A cervical sprain is an injury in the neck in which the strong, fibrous tissues (ligaments) that connect your neck bones stretch or tear. Cervical sprains can range from mild to severe. Severe cervical sprains can cause the neck vertebrae to be unstable. This can lead to damage of the spinal cord and can result in serious nervous system problems. The amount of time it takes for a cervical sprain to get better depends on the cause and extent of the injury. Most cervical sprains heal in 1 to 3 weeks. CAUSES  Severe cervical sprains may be caused by:   Contact sport injuries (such as from football, rugby, wrestling, hockey, auto racing, gymnastics, diving, martial arts, or boxing).   Motor vehicle collisions.   Whiplash injuries. This is an injury from a sudden forward and backward whipping movement of the head and neck.  Falls.  Mild cervical sprains may be caused by:   Being in an awkward position, such as while cradling a telephone between your ear and shoulder.   Sitting in a chair that does not offer proper support.   Working at a poorly Landscape [employer] station.   Looking up or down for long periods of time.  SYMPTOMS   Pain, soreness, stiffness, or a burning sensation in the front, back, or sides of the neck. This discomfort may develop immediately after the injury or slowly, 24 hours or more after the injury.   Pain or tenderness directly in the middle of the back of the neck.   Shoulder or upper back pain.   Limited ability to move the neck.   Headache.   Dizziness.   Weakness, numbness, or tingling in the hands or arms.   Muscle spasms.   Difficulty  swallowing or chewing.   Tenderness and swelling of the neck.  DIAGNOSIS  Most of the time your health care provider can diagnose a cervical sprain by taking your history and doing a physical exam. Your health care provider will ask about previous neck injuries and any known neck problems, such as arthritis in the neck. X-rays may be taken to find out if there are any other problems, such as with the bones of the neck. Other tests, such as a CT scan or MRI, may also be needed.  TREATMENT  Treatment depends on the severity of the cervical sprain. Mild sprains can be treated with rest, keeping the neck in place (immobilization), and pain medicines. Severe cervical sprains are immediately immobilized. Further treatment is done to help with pain, muscle spasms, and other symptoms and may include:  Medicines, such as pain relievers, numbing medicines, or muscle relaxants.   Physical therapy. This may involve stretching exercises, strengthening exercises, and posture training. Exercises and improved posture can help stabilize the neck, strengthen muscles, and help stop symptoms from returning.  HOME CARE INSTRUCTIONS   Put ice on the injured area.   Put ice in a plastic bag.   Place a towel between your skin and the bag.   Leave the ice on for 15-20 minutes, 3-4 times a day.   If your injury was severe, you may have been given a cervical collar to wear.  A cervical collar is a two-piece collar designed to keep your neck from moving while it heals.  Do not remove the collar unless instructed by your health care provider.  If you have long hair, keep it outside of the collar.  Ask your health care provider before making any adjustments to your collar. Minor adjustments may be required over time to improve comfort and reduce pressure on your chin or on the back of your head.  Ifyou are allowed to remove the collar for cleaning or bathing, follow your health care provider's instructions on  how to do so safely.  Keep your collar clean by wiping it with mild soap and water and drying it completely. If the collar you have been given includes removable pads, remove them every 1-2 days and hand wash them with soap and water. Allow them to air dry. They should be completely dry before you wear them in the collar.  If you are allowed to remove the collar for cleaning and bathing, wash and dry the skin of your neck. Check your skin for irritation or sores. If you see any, tell your health care provider.  Do not drive while wearing the collar.   Only take over-the-counter or prescription medicines for pain, discomfort, or fever as directed by your health care provider.   Keep all follow-up appointments as directed by your health care provider.   Keep all physical therapy appointments as directed by your health care provider.   Make any needed adjustments to your workstation to promote good posture.   Avoid positions and activities that make your symptoms worse.   Warm up and stretch before being active to help prevent problems.  SEEK MEDICAL CARE IF:   Your pain is not controlled with medicine.   You are unable to decrease your pain medicine over time as planned.   Your activity level is not improving as expected.  SEEK IMMEDIATE MEDICAL CARE IF:   You develop any bleeding.  You develop stomach upset.  You have signs of an allergic reaction to your medicine.   Your symptoms get worse.   You develop new, unexplained symptoms.   You have numbness, tingling, weakness, or paralysis in any part of your body.  MAKE SURE YOU:   Understand these instructions.  Will watch your condition.  Will get help right away if you are not doing well or get worse. Document Released: 01/25/2007 Document Revised: 04/04/2013 Document Reviewed: 10/05/2012 [facility] Patient Information 2015 [place], [place]. This information is not intended to replace advice given to you by  your health care provider. Make sure you discuss any questions you have with your health care provider.  Cervical Strain and Sprain (Whiplash) with Rehab Cervical strain and sprains are injuries that commonly occur with "whiplash" injuries. Whiplash occurs when the neck is forcefully whipped backward or forward, such as during a motor vehicle accident. The muscles, ligaments, tendons, discs and nerves of the neck are susceptible to injury when this occurs. SYMPTOMS   Pain or stiffness in the front and/or back of neck  Symptoms may present immediately or up to 24 hours after injury.  Dizziness, headache, nausea and vomiting.  Muscle spasm with soreness and stiffness in the neck.  Tenderness and swelling at the injury site. CAUSES  Whiplash injuries often occur during contact sports or motor vehicle accidents.  RISK INCREASES WITH:  Osteoarthritis of the spine.  Situations that make head or neck accidents or trauma more likely.  High-risk sports (football, rugby,  wrestling, hockey, auto racing, gymnastics, diving, contact karate or boxing).  Poor strength and flexibility of the neck.  Previous neck injury.  Poor tackling technique.  Improperly fitted or padded equipment. PREVENTION  Learn and use proper technique (avoid tackling with the head, spearing and head-butting; use proper falling techniques to avoid landing on the head).  Warm up and stretch properly before activity.  Maintain physical fitness:  Strength, flexibility and endurance.  Cardiovascular fitness.  Wear properly fitted and padded protective equipment, such as padded soft collars, for participation in contact sports. PROGNOSIS  Recovery for cervical strain and sprain injuries is dependent on the extent of the injury. These injuries are usually curable in 1 week to 3 months with appropriate treatment.  RELATED COMPLICATIONS   Temporary numbness and weakness may occur if the nerve roots are damaged, and  this may persist until the nerve has completely healed.  Chronic pain due to frequent recurrence of symptoms.  Prolonged healing, especially if activity is resumed too soon (before complete recovery). TREATMENT  Treatment initially involves the use of ice and medication to help reduce pain and inflammation. It is also important to perform strengthening and stretching exercises and modify activities that worsen symptoms so the injury does not get worse. These exercises may be performed at home or with a therapist. For patients who experience severe symptoms, a soft padded collar may be recommended to be worn around the neck.  Improving your posture may help reduce symptoms. Posture improvement includes pulling your chin and abdomen in while sitting or standing. If you are sitting, sit in a firm chair with your buttocks against the back of the chair. While sleeping, try replacing your pillow with a small towel rolled to 2 inches in diameter, or use a cervical pillow or soft cervical collar. Poor sleeping positions delay healing.  For patients with nerve root damage, which causes numbness or weakness, the use of a cervical traction apparatus may be recommended. Surgery is rarely necessary for these injuries. However, cervical strain and sprains that are present at birth (congenital) may require surgery. MEDICATION   If pain medication is necessary, nonsteroidal anti-inflammatory medications, such as aspirin and ibuprofen, or other minor pain relievers, such as acetaminophen, are often recommended.  Do not take pain medication for 7 days before surgery.  Prescription pain relievers may be given if deemed necessary by your caregiver. Use only as directed and only as much as you need. HEAT AND COLD:   Cold treatment (icing) relieves pain and reduces inflammation. Cold treatment should be applied for 10 to 15 minutes every 2 to 3 hours for inflammation and pain and immediately after any activity that  aggravates your symptoms. Use ice packs or an ice massage.  Heat treatment may be used prior to performing the stretching and strengthening activities prescribed by your caregiver, physical therapist, or athletic trainer. Use a heat pack or a warm soak. SEEK MEDICAL CARE IF:   Symptoms get worse or do not improve in 2 weeks despite treatment.  New, unexplained symptoms develop (drugs used in treatment may produce side effects). EXERCISES RANGE OF MOTION (ROM) AND STRETCHING EXERCISES - Cervical Strain and Sprain These exercises may help you when beginning to rehabilitate your injury. In order to successfully resolve your symptoms, you must improve your posture. These exercises are designed to help reduce the forward-head and rounded-shoulder posture which contributes to this condition. Your symptoms may resolve with or without further involvement from your physician, physical therapist or athletic  trainer. While completing these exercises, remember:   Restoring tissue flexibility helps normal motion to return to the joints. This allows healthier, less painful movement and activity.  An effective stretch should be held for at least 20 seconds, although you may need to begin with shorter hold times for comfort.  A stretch should never be painful. You should only feel a gentle lengthening or release in the stretched tissue. STRETCH- Axial Extensors  Lie on your back on the floor. You may bend your knees for comfort. Place a rolled up hand towel or dish towel, about 2 inches in diameter, under the part of your head that makes contact with the floor.  Gently tuck your chin, as if trying to make a "double chin," until you feel a gentle stretch at the base of your head.  Hold __________ seconds. Repeat __________ times. Complete this exercise __________ times per day.  STRETECH - Axial Extension   Stand or sit on a firm surface. Assume a good posture: chest up, shoulders drawn back, abdominal  muscles slightly tense, knees unlocked (if standing) and feet hip width apart.  Slowly retract your chin so your head slides back and your chin slightly lowers.Continue to look straight ahead.  You should feel a gentle stretch in the back of your head. Be certain not to feel an aggressive stretch since this can cause headaches later.  Hold for __________ seconds. Repeat __________ times. Complete this exercise __________ times per day. STRETCH - Cervical Side Bend   Stand or sit on a firm surface. Assume a good posture: chest up, shoulders drawn back, abdominal muscles slightly tense, knees unlocked (if standing) and feet hip width apart.  Without letting your nose or shoulders move, slowly tip your right / left ear to your shoulder until your feel a gentle stretch in the muscles on the opposite side of your neck.  Hold __________ seconds. Repeat __________ times. Complete this exercise __________ times per day. STRETCH - Cervical Rotators   Stand or sit on a firm surface. Assume a good posture: chest up, shoulders drawn back, abdominal muscles slightly tense, knees unlocked (if standing) and feet hip width apart.  Keeping your eyes level with the ground, slowly turn your head until you feel a gentle stretch along the back and opposite side of your neck.  Hold __________ seconds. Repeat __________ times. Complete this exercise __________ times per day. RANGE OF MOTION - Neck Circles   Stand or sit on a firm surface. Assume a good posture: chest up, shoulders drawn back, abdominal muscles slightly tense, knees unlocked (if standing) and feet hip width apart.  Gently roll your head down and around from the back of one shoulder to the back of the other. The motion should never be forced or painful.  Repeat the motion 10-20 times, or until you feel the neck muscles relax and loosen. Repeat __________ times. Complete the exercise __________ times per day. STRENGTHENING EXERCISES - Cervical  Strain and Sprain These exercises may help you when beginning to rehabilitate your injury. They may resolve your symptoms with or without further involvement from your physician, physical therapist or athletic trainer. While completing these exercises, remember:   Muscles can gain both the endurance and the strength needed for everyday activities through controlled exercises.  Complete these exercises as instructed by your physician, physical therapist or athletic trainer. Progress the resistance and repetitions only as guided.  You may experience muscle soreness or fatigue, but the pain or discomfort you are  trying to eliminate should never worsen during these exercises. If this pain does worsen, stop and make certain you are following the directions exactly. If the pain is still present after adjustments, discontinue the exercise until you can discuss the trouble with your clinician. STRENGTH - Cervical Flexors, Isometric  Face a wall, standing about 6 inches away. Place a small pillow, a ball about 6-8 inches in diameter, or a folded towel between your forehead and the wall.  Slightly tuck your chin and gently push your forehead into the soft object. Push only with mild to moderate intensity, building up tension gradually. Keep your jaw and forehead relaxed.  Hold 10 to 20 seconds. Keep your breathing relaxed.  Release the tension slowly. Relax your neck muscles completely before you start the next repetition. Repeat __________ times. Complete this exercise __________ times per day. STRENGTH- Cervical Lateral Flexors, Isometric   Stand about 6 inches away from a wall. Place a small pillow, a ball about 6-8 inches in diameter, or a folded towel between the side of your head and the wall.  Slightly tuck your chin and gently tilt your head into the soft object. Push only with mild to moderate intensity, building up tension gradually. Keep your jaw and forehead relaxed.  Hold 10 to 20 seconds.  Keep your breathing relaxed.  Release the tension slowly. Relax your neck muscles completely before you start the next repetition. Repeat __________ times. Complete this exercise __________ times per day. STRENGTH - Cervical Extensors, Isometric   Stand about 6 inches away from a wall. Place a small pillow, a ball about 6-8 inches in diameter, or a folded towel between the back of your head and the wall.  Slightly tuck your chin and gently tilt your head back into the soft object. Push only with mild to moderate intensity, building up tension gradually. Keep your jaw and forehead relaxed.  Hold 10 to 20 seconds. Keep your breathing relaxed.  Release the tension slowly. Relax your neck muscles completely before you start the next repetition. Repeat __________ times. Complete this exercise __________ times per day. POSTURE AND BODY MECHANICS CONSIDERATIONS - Cervical Strain and Sprain Keeping correct posture when sitting, standing or completing your activities will reduce the stress put on different body tissues, allowing injured tissues a chance to heal and limiting painful experiences. The following are general guidelines for improved posture. Your physician or physical therapist will provide you with any instructions specific to your needs. While reading these guidelines, remember:  The exercises prescribed by your provider will help you have the flexibility and strength to maintain correct postures.  The correct posture provides the optimal environment for your joints to work. All of your joints have less wear and tear when properly supported by a spine with good posture. This means you will experience a healthier, less painful body.  Correct posture must be practiced with all of your activities, especially prolonged sitting and standing. Correct posture is as important when doing repetitive low-stress activities (typing) as it is when doing a single heavy-load activity (lifting). PROLONGED  STANDING WHILE SLIGHTLY LEANING FORWARD When completing a task that requires you to lean forward while standing in one place for a long time, place either foot up on a stationary 2-4 inch high object to help maintain the best posture. When both feet are on the ground, the low back tends to lose its slight inward curve. If this curve flattens (or becomes too large), then the back and your other joints  will experience too much stress, fatigue more quickly and can cause pain.  RESTING POSITIONS Consider which positions are most painful for you when choosing a resting position. If you have pain with flexion-based activities (sitting, bending, stooping, squatting), choose a position that allows you to rest in a less flexed posture. You would want to avoid curling into a fetal position on your side. If your pain worsens with extension-based activities (prolonged standing, working overhead), avoid resting in an extended position such as sleeping on your stomach. Most people will find more comfort when they rest with their spine in a more neutral position, neither too rounded nor too arched. Lying on a non-sagging bed on your side with a pillow between your knees, or on your back with a pillow under your knees will often provide some relief. Keep in mind, being in any one position for a prolonged period of time, no matter how correct your posture, can still lead to stiffness. WALKING Walk with an upright posture. Your ears, shoulders and hips should all line-up. OFFICE WORK When working at a desk, create an environment that supports good, upright posture. Without extra support, muscles fatigue and lead to excessive strain on joints and other tissues. CHAIR:  A chair should be able to slide under your desk when your back makes contact with the back of the chair. This allows you to work closely.  The chair's height should allow your eyes to be level with the upper part of your monitor and your hands to be slightly  lower than your elbows.  Body position:  Your feet should make contact with the floor. If this is not possible, use a foot rest.  Keep your ears over your shoulders. This will reduce stress on your neck and low back. Document Released: 03/30/2005 Document Revised: 07/25/2012 Document Reviewed: 07/12/2008 [facility] Patient Information 2015 [place], [place]. This information is not intended to replace advice given to you by your health care provider. Make sure you discuss any questions you have with your health care provider.

## 2013-10-24 NOTE — ED Notes (Signed)
C/o neck pain onset after water aerobics @ 0930 both sides of neck L>R.  Took a Flexeril 5 mg. at 1130 and took another one @ 1500.  It put her asleep but did not relieve pain.

## 2013-10-25 NOTE — ED Provider Notes (Signed)
Medical screening examination/treatment/procedure(s) were performed by non-physician practitioner and as supervising physician I was immediately available for consultation/collaboration.  Philipp Deputy, M.D.  Harden Mo, MD 10/25/13 [id number]

## 2013-10-27 ENCOUNTER — Encounter: Payer: Self-pay | Admitting: *Deleted

## 2013-10-27 ENCOUNTER — Encounter: Payer: Federal, State, Local not specified - PPO | Attending: Physician Assistant | Admitting: *Deleted

## 2013-10-27 VITALS — Ht 67.0 in | Wt 228.2 lb

## 2013-10-27 DIAGNOSIS — R7309 Other abnormal glucose: Secondary | ICD-10-CM | POA: Diagnosis not present

## 2013-10-27 DIAGNOSIS — Z713 Dietary counseling and surveillance: Secondary | ICD-10-CM | POA: Diagnosis not present

## 2013-10-27 DIAGNOSIS — R7303 Prediabetes: Secondary | ICD-10-CM

## 2013-10-27 NOTE — Progress Notes (Signed)
Medical Nutrition Therapy:  Appt start time: 2876 end time:  1045.  Assessment:  Patient returns today for a follow up for pre-diabetes and weight management. Her weight has not changed in the last month. 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 She has been more conscious of adding more protein to her diet, with beans, nuts, whey protein powder, and small amounts of chicken. However, her diet is still low in protein. I also suspect that with an increase in plant proteins and low intake of grains, she is not getting adequate proportions of essential amino acids. Bioelectric impedence found a loss of 3.5 pounds of fat free mass, indicating a loss of muscle mass. She continues to exercise 60-90 minutes most days.   TANITA  BODY COMP RESULTS     % Fat Mass 47.8%   Fat Mass (lbs) 109.0   Fat Free Mass (lbs) 117.5   Total Body Water (lbs) 86.0     MEDICATIONS: See list   DIETARY INTAKE:   Usual eating pattern includes 3 meals and 3 snacks per day.  24-hr recall:  B ( AM): Smoothie (protein powder 15 g protein, fruit, almond milk/juice, green vegetable powder Snk ( AM): None  L ( PM): Stirfy OR vegetables (broccoli, vegetable medley) with butter, salt, pepper OR can of dry beans - now having nuts or beans with lunch Snk ( PM): Fruit (usually citrus, 2 servings), carrots with light ranch, pretzels, protein bar (snacks on these throughout the afternoon), nuts, PB with celery D ( PM): Mixed vegetables  Snk ( PM): PB sandwich Beverages: Water with lemon, Iso Tea  Usual physical activity: Walking 60 minutes most mornings, 25-30 minutes cardio and upper body resistance training at gym most days  Estimated energy needs: 1200-1500 calories 135-170 g carbohydrates 75-94 g protein 40-50 g fat  Progress Towards Goal(s):  Some progress.   Nutritional Diagnosis:  NB-1.1 Food and nutrition-related knowledge deficit As  related to prediabetes. As evidenced by no prior education.    Intervention:  Nutrition counseling. We reviewed strategies for weight loss. Patient educated on the importance of consuming adequate protein, especially during weight loss. Patient advised to consume at least 80 g, but preferably up to 100 g protein daily (15-20 g with meals, 8-15 g with snacks). We also discussed complementary proteins (beans, grains). Patient instructed to consume complete proteins when possible and include 1/2 cup of grains (quinoa) at meals especially with beans and nuts. This will also provide her with healthy, complex carbs to help meet minimum needs without elevating blood glucose. She was also instructed to eat within the 2 hours before exercise to provide healthy carbs and protein for energy.   Handouts given during visit include:  Handwritten notes  Monitoring/Evaluation:  Dietary intake, exercise, blood glucose, and body weight in 1 month(s).

## 2013-12-05 ENCOUNTER — Other Ambulatory Visit: Payer: Self-pay | Admitting: Physician Assistant

## 2013-12-08 ENCOUNTER — Encounter: Payer: Self-pay | Admitting: *Deleted

## 2013-12-08 ENCOUNTER — Encounter: Payer: Federal, State, Local not specified - PPO | Attending: Physician Assistant | Admitting: *Deleted

## 2013-12-08 VITALS — Ht 67.0 in | Wt 228.5 lb

## 2013-12-08 DIAGNOSIS — Z713 Dietary counseling and surveillance: Secondary | ICD-10-CM | POA: Insufficient documentation

## 2013-12-08 DIAGNOSIS — R7309 Other abnormal glucose: Secondary | ICD-10-CM | POA: Insufficient documentation

## 2013-12-08 DIAGNOSIS — E669 Obesity, unspecified: Secondary | ICD-10-CM

## 2013-12-08 NOTE — Progress Notes (Signed)
Medical Nutrition Therapy:  Appt start time: 3888 end time:  1045.  Assessment:  Patient returns today for a follow up for pre-diabetes and weight management. Her weight is unchanged from 1 month ago. However, her sister was recently diagnosed with cancer, which has been a stress for her. She has not been able to exercise regularly, and has not had consistent meals. Per Tanita scale, fat mass has increased by about 6 pounds, and fat free mass decreased by 4 pounds. She has tried to be more conscious of adding more protein to diet (mostly vegetarian, beans, nuts, protein shake and bars, some chicken), but forgets to include a whole grain to ensure she is eating adequate amounts of amino acids.   TANITA  BODY COMP RESULTS     BMI (kg/m^2) 35.9   Fat Mass (lbs) 115.5   Fat Free Mass (lbs) 113   Total Body Water (lbs) 84.5    MEDICATIONS: See list  Usual physical activity: Some walking, was walking 60 minutes most mornings with 25-30 minutes of cardio and upper body resistance at the gym most days  Estimated energy needs: 1200-1500 calories 135-170 g carbohydrates 75-94 g protein 40-50 g fat  Progress Towards Goal(s):  In progress.   Nutritional Diagnosis:  NB-1.1 Food and nutrition-related knowledge deficit As related to prediabetes. As evidenced by no prior education.    Intervention:  Nutrition counseling. We reviewed the importance of consuming adequate protein (complete proteins), especially during weight loss. Patient advised to continue to include a good source of protein as well. We also discussed complementary proteins (beans with grains). Patient instructed to consume complete proteins when possible and include 1/2 cup of grains (15-30 grams of carbs) at meals especially with beans and nuts. She was also instructed to eat within the 2 hours before exercise to provide healthy carbs and protein for energy. Work back up to previous exercise habits.    Monitoring/Evaluation:  Dietary  intake, exercise, and body weight in 2 month(s).

## 2013-12-08 NOTE — Progress Notes (Deleted)
  Medical Nutrition Therapy:  Appt start time: {Time; Appointment:21385} end time:  {Time; Appointment:21385}.   Assessment:  ***.   MEDICATIONS: ***   DIETARY INTAKE:   Usual eating pattern includes *** meals and *** snacks per day.  24-hr recall:  B ( AM): ***  Snk ( AM): ***  L ( PM): *** Snk ( PM): *** D ( PM): *** Snk ( PM): *** Beverages: ***  Usual physical activity: ***  Estimated energy needs: *** calories *** g carbohydrates *** g protein *** g fat  Progress Towards Goal(s):  {Desc; Goals Progress:21388}.   Nutritional Diagnosis:  {CHL AMB NUTRITIONAL DIAGNOSIS:[id number]}    Intervention:  Nutrition ***.  Handouts given during visit include:  ***  ***  Monitoring/Evaluation:  Dietary intake, exercise, ***, and body weight {follow up:15908}.

## 2014-01-10 ENCOUNTER — Ambulatory Visit: Payer: Federal, State, Local not specified - PPO | Attending: Specialist | Admitting: Occupational Therapy

## 2014-01-10 DIAGNOSIS — IMO0001 Reserved for inherently not codable concepts without codable children: Secondary | ICD-10-CM | POA: Insufficient documentation

## 2014-01-10 DIAGNOSIS — M6281 Muscle weakness (generalized): Secondary | ICD-10-CM | POA: Insufficient documentation

## 2014-01-10 DIAGNOSIS — M25549 Pain in joints of unspecified hand: Secondary | ICD-10-CM | POA: Diagnosis not present

## 2014-01-10 DIAGNOSIS — G5600 Carpal tunnel syndrome, unspecified upper limb: Secondary | ICD-10-CM | POA: Diagnosis not present

## 2014-01-12 ENCOUNTER — Ambulatory Visit: Payer: Federal, State, Local not specified - PPO | Attending: Specialist | Admitting: Occupational Therapy

## 2014-01-12 DIAGNOSIS — G5602 Carpal tunnel syndrome, left upper limb: Secondary | ICD-10-CM | POA: Diagnosis present

## 2014-01-12 DIAGNOSIS — M79641 Pain in right hand: Secondary | ICD-10-CM | POA: Diagnosis not present

## 2014-01-12 DIAGNOSIS — G5601 Carpal tunnel syndrome, right upper limb: Secondary | ICD-10-CM | POA: Diagnosis not present

## 2014-01-12 DIAGNOSIS — M6281 Muscle weakness (generalized): Secondary | ICD-10-CM | POA: Diagnosis not present

## 2014-01-12 DIAGNOSIS — M79642 Pain in left hand: Secondary | ICD-10-CM | POA: Insufficient documentation

## 2014-01-15 ENCOUNTER — Ambulatory Visit: Payer: Federal, State, Local not specified - PPO | Admitting: Occupational Therapy

## 2014-01-15 DIAGNOSIS — G5602 Carpal tunnel syndrome, left upper limb: Secondary | ICD-10-CM | POA: Diagnosis not present

## 2014-01-16 ENCOUNTER — Ambulatory Visit: Payer: Federal, State, Local not specified - PPO | Admitting: Occupational Therapy

## 2014-01-16 DIAGNOSIS — G5602 Carpal tunnel syndrome, left upper limb: Secondary | ICD-10-CM | POA: Diagnosis not present

## 2014-01-23 ENCOUNTER — Ambulatory Visit: Payer: Federal, State, Local not specified - PPO | Admitting: Occupational Therapy

## 2014-01-23 DIAGNOSIS — G5602 Carpal tunnel syndrome, left upper limb: Secondary | ICD-10-CM | POA: Diagnosis not present

## 2014-01-25 ENCOUNTER — Encounter: Payer: Federal, State, Local not specified - PPO | Admitting: Occupational Therapy

## 2014-02-09 ENCOUNTER — Encounter: Payer: Federal, State, Local not specified - PPO | Admitting: Dietician

## 2014-02-13 ENCOUNTER — Ambulatory Visit: Payer: Federal, State, Local not specified - PPO | Attending: Specialist | Admitting: Occupational Therapy

## 2014-02-13 ENCOUNTER — Encounter: Payer: Self-pay | Admitting: Occupational Therapy

## 2014-02-13 DIAGNOSIS — M79641 Pain in right hand: Secondary | ICD-10-CM | POA: Diagnosis not present

## 2014-02-13 DIAGNOSIS — G5601 Carpal tunnel syndrome, right upper limb: Secondary | ICD-10-CM | POA: Insufficient documentation

## 2014-02-13 DIAGNOSIS — M79642 Pain in left hand: Secondary | ICD-10-CM | POA: Diagnosis not present

## 2014-02-13 DIAGNOSIS — M6281 Muscle weakness (generalized): Secondary | ICD-10-CM | POA: Diagnosis not present

## 2014-02-13 DIAGNOSIS — R29898 Other symptoms and signs involving the musculoskeletal system: Secondary | ICD-10-CM

## 2014-02-13 DIAGNOSIS — G5602 Carpal tunnel syndrome, left upper limb: Secondary | ICD-10-CM | POA: Insufficient documentation

## 2014-02-13 NOTE — Therapy (Addendum)
Occupational Therapy Treatment  Patient Details  Name: Natalie Vazquez MRN: [id number] Date of Birth: 12-08-1960  Encounter Date: 02/13/2014      OT End of Session - 02/13/14 1441    Visit Number 6   Number of Visits 10   Date for OT Re-Evaluation 02/23/14   OT Start Time 0205   OT Stop Time 0245   OT Time Calculation (min) 40 min   Activity Tolerance Patient tolerated treatment well    * OT treatment provided from 1405-1445 on 02/23/14.   Past Medical History  Diagnosis Date  . Asthma     mild, intermittent  . Frequent urination   . GERD (gastroesophageal reflux disease)   . Headache(784.0)   . PONV (postoperative nausea and vomiting)     difficulty waking up  . Arthritis   . Migraine   . Chicken pox   . Fibroids   . Fibromyalgia   . Anemia   . Problems related to high-risk sexual behavior     Unprotected sex  . CTS (carpal tunnel syndrome)   . Cholelithiasis   . Symptomatic menopausal or female climacteric states   . Allergic rhinitis due to other allergen     Past Surgical History  Procedure Laterality Date  . Tubal ligation    . Abdominal hysterectomy  2008    partial, ovaries remain  . Laser ablation condyloma cervical / vulvar    . Cholecystectomy  05/19/2011    Procedure: LAPAROSCOPIC CHOLECYSTECTOMY;  Surgeon: Zenovia Jarred, MD;  Location: [facility];  Service: General;  Laterality: N/A;  . Percutaneous pinning  01/22/2012    Procedure: PERCUTANEOUS PINNING EXTREMITY;  Surgeon: Marybelle Killings, MD;  Location: [facility];  Service: Orthopedics;  Laterality: Right;  Closed Reduction and Pinning right 5th Metacarpal Fracture  . Wisdom tooth extraction    . Carpal tunnel release Right 2015    There were no vitals taken for this visit.  Visit Diagnosis:  Hand pain, left  Right hand pain  Right hand weakness          OT Treatments/Exercises (OP) - 02/13/14 0700    Modalities Fluidotherapy  x7min to BUEs with no adverse reactions due to pain/stiffne      Picking up blocks using 35lbs sustained grip strength for increased strength with min difficulty.  Wrist winder with 2lb wt for wrist flex/ext strengthening with min-mod difficulty.       Education - 02/13/14 1435    Education provided Yes   Education Details Updated current HEP:  green putty for gross grasp and  indvidual finger pinch, 2lb weight now for wrist flex/ext   Education Details Patient   Methods Explanation;Demonstration;Verbal cues   Comprehension Verbalized understanding;Returned demonstration            OT Long Term Goals - 02/13/14 1419    Title increase RUE grip strength to 30lbs for increased functional use.   Status Achieved  56lbs   Title resume use of RUE as dominant hand at least 50% of the time with pain less than or equal to 3/10   Status Achieved  no pain most of the time          Plan - 02/13/14 1436    Clinical Impression Statement pt reports significantly decreased pain and demo increased strength/activity tolerance   OT Plan possible d/c next session if updated HEP going well/no increased pain        Problem List Patient Active Problem List  Diagnosis Date Noted  . Migraines 09/28/2013  . Visit for preventive health examination 09/10/2013  . Fatigue 09/10/2013  . Encounter for preventive health examination 03/19/2013  . GERD (gastroesophageal reflux disease) 03/19/2013  . Seasonal allergies 03/19/2013  . Neck pain, chronic 03/19/2013  . Carpal tunnel syndrome 03/19/2013  . IBS (irritable bowel syndrome) 03/19/2013  . Fracture, metacarpal shaft 01/22/2012                                            FREEMAN,ANGELA 02/13/2014, 2:50 PM

## 2014-02-13 NOTE — Patient Instructions (Addendum)
Update:  Perform wrist exercises (from last session) with heavier bottle (approximately 2lbs) now.  Start with 15 reps and slowly increase to 20 over next 2 weeks.  Update:  Perform the same putty exercises (from last session) with green putty now.

## 2014-02-15 ENCOUNTER — Ambulatory Visit: Payer: Federal, State, Local not specified - PPO | Admitting: Occupational Therapy

## 2014-02-20 ENCOUNTER — Ambulatory Visit: Payer: Federal, State, Local not specified - PPO | Admitting: Occupational Therapy

## 2014-02-20 DIAGNOSIS — G5602 Carpal tunnel syndrome, left upper limb: Secondary | ICD-10-CM | POA: Diagnosis not present

## 2014-02-20 DIAGNOSIS — R29898 Other symptoms and signs involving the musculoskeletal system: Secondary | ICD-10-CM

## 2014-02-20 DIAGNOSIS — M79641 Pain in right hand: Secondary | ICD-10-CM

## 2014-02-20 DIAGNOSIS — M79642 Pain in left hand: Secondary | ICD-10-CM

## 2014-02-20 NOTE — Therapy (Signed)
Occupational Therapy Treatment  Patient Details  Name: Natalie Vazquez MRN: [id number] Date of Birth: 07/03/1960  Encounter Date: 02/20/2014      OT End of Session - 02/20/14 1435    Visit Number 7   Number of Visits 10   OT Start Time 1405   OT Stop Time 1445   OT Time Calculation (min) 40 min   Activity Tolerance Patient tolerated treatment well      Past Medical History  Diagnosis Date  . Asthma     mild, intermittent  . Frequent urination   . GERD (gastroesophageal reflux disease)   . Headache(784.0)   . PONV (postoperative nausea and vomiting)     difficulty waking up  . Arthritis   . Migraine   . Chicken pox   . Fibroids   . Fibromyalgia   . Anemia   . Problems related to high-risk sexual behavior     Unprotected sex  . CTS (carpal tunnel syndrome)   . Cholelithiasis   . Symptomatic menopausal or female climacteric states   . Allergic rhinitis due to other allergen     Past Surgical History  Procedure Laterality Date  . Tubal ligation    . Abdominal hysterectomy  2008    partial, ovaries remain  . Laser ablation condyloma cervical / vulvar    . Cholecystectomy  05/19/2011    Procedure: LAPAROSCOPIC CHOLECYSTECTOMY;  Surgeon: Burke E Thompson, MD;  Location: [facility];  Service: General;  Laterality: N/A;  . Percutaneous pinning  01/22/2012    Procedure: PERCUTANEOUS PINNING EXTREMITY;  Surgeon: Mark C Yates, MD;  Location: [facility];  Service: Orthopedics;  Laterality: Right;  Closed Reduction and Pinning right 5th Metacarpal Fracture  . Wisdom tooth extraction    . Carpal tunnel release Right 2015    There were no vitals taken for this visit.  Visit Diagnosis:  Right hand pain  Right hand weakness  Hand pain, left                OT Long Term Goals - 02/20/14 1422    OT LONG TERM GOAL #1   Title I with HEP.   Time 1   Period Days   Status Achieved   OT LONG TERM GOAL #2   Title I with positioning/activity modification including splint wear  schedule to minimize pain/symptoms   Time 1   Period Days   Status Achieved   OT LONG TERM GOAL #3   Title increase RUE grip strength to 30lbs for increased functional use.   Time 1   Period Days   Status Achieved   OT LONG TERM GOAL #4   Title resume use of RUE as dominant hand at least 50% of the time with pain less than or equal to 3/10   Time 1   Period Days   Status Achieved          Plan - 02/20/14 1445    Clinical Impression Statement Checked progress towards goals for d/c. Reviewed theraputty and wrist strengthening HEP. Gripper set at 55 lbs to pick up blocks, min difficulty, 1-2 rest breaks bilateral UE's.   Rehab Potential Good   OT Frequency 2x / week   OT Duration 4 weeks   OT Treatment/Interventions Self-care/ADL training;Electrical Stimulation;Therapeutic exercise;Splinting;Moist Heat;Fluidtherapy;Energy conservation;Patient/family education;Therapeutic exercises;Therapeutic activities;Manual Therapy   Plan D/C OT   OT Home Exercise Plan Putty HEP reviewed.   Consulted and Agree with Plan of Care Patient          Problem List Patient Active Problem List   Diagnosis Date Noted  . Migraines 09/28/2013  . Visit for preventive health examination 09/10/2013  . Fatigue 09/10/2013  . Encounter for preventive health examination 03/19/2013  . GERD (gastroesophageal reflux disease) 03/19/2013  . Seasonal allergies 03/19/2013  . Neck pain, chronic 03/19/2013  . Carpal tunnel syndrome 03/19/2013  . IBS (irritable bowel syndrome) 03/19/2013  . Fracture, metacarpal shaft 01/22/2012     OCCUPATIONAL THERAPY DISCHARGE SUMMARY  Visits from Start of Care: 7    Remaining deficits: Mildly decreased strength, R thumb pain   Education / Equipment: Pt was educated regarding the following:HEP and positioning to minimize symptoms/ pain. Pt verbalized understanding of all education.  Plan: Patient agrees to discharge.  Patient goals were partially met. Patient is being  discharged due to meeting the stated rehab goals.  ?????                                              RINE,KATHRYN 02/20/2014, 3:13 PM   

## 2014-02-21 ENCOUNTER — Other Ambulatory Visit (HOSPITAL_COMMUNITY): Payer: Self-pay | Admitting: Obstetrics

## 2014-02-21 DIAGNOSIS — Z780 Asymptomatic menopausal state: Secondary | ICD-10-CM

## 2014-02-21 DIAGNOSIS — Z1231 Encounter for screening mammogram for malignant neoplasm of breast: Secondary | ICD-10-CM

## 2014-02-24 ENCOUNTER — Other Ambulatory Visit: Payer: Self-pay | Admitting: Physician Assistant

## 2014-02-26 ENCOUNTER — Encounter: Payer: Federal, State, Local not specified - PPO | Admitting: Occupational Therapy

## 2014-02-26 NOTE — Telephone Encounter (Signed)
Rx request to pharmacy/SLS  

## 2014-02-28 ENCOUNTER — Encounter (HOSPITAL_COMMUNITY): Payer: Self-pay | Admitting: Family Medicine

## 2014-02-28 ENCOUNTER — Encounter (HOSPITAL_COMMUNITY): Payer: Self-pay

## 2014-02-28 ENCOUNTER — Emergency Department (HOSPITAL_COMMUNITY)
Admission: EM | Admit: 2014-02-28 | Discharge: 2014-02-28 | Disposition: A | Discharge status: Home / Self Care | Payer: Federal, State, Local not specified - PPO | Attending: Emergency Medicine | Admitting: Emergency Medicine

## 2014-02-28 ENCOUNTER — Ambulatory Visit: Payer: Federal, State, Local not specified - PPO | Admitting: Occupational Therapy

## 2014-02-28 ENCOUNTER — Emergency Department (HOSPITAL_COMMUNITY): Payer: Federal, State, Local not specified - PPO

## 2014-02-28 ENCOUNTER — Emergency Department (HOSPITAL_COMMUNITY)
Admission: EM | Admit: 2014-02-28 | Discharge: 2014-02-28 | Disposition: A | Discharge status: Other Acute Inpatient Hospital | Payer: Federal, State, Local not specified - PPO | Source: Home / Self Care | Attending: Family Medicine | Admitting: Family Medicine

## 2014-02-28 DIAGNOSIS — Z91040 Latex allergy status: Secondary | ICD-10-CM | POA: Diagnosis not present

## 2014-02-28 DIAGNOSIS — S79922A Unspecified injury of left thigh, initial encounter: Secondary | ICD-10-CM | POA: Diagnosis not present

## 2014-02-28 DIAGNOSIS — M797 Fibromyalgia: Secondary | ICD-10-CM | POA: Diagnosis not present

## 2014-02-28 DIAGNOSIS — X58XXXA Exposure to other specified factors, initial encounter: Secondary | ICD-10-CM | POA: Diagnosis not present

## 2014-02-28 DIAGNOSIS — Z862 Personal history of diseases of the blood and blood-forming organs and certain disorders involving the immune mechanism: Secondary | ICD-10-CM | POA: Diagnosis not present

## 2014-02-28 DIAGNOSIS — R609 Edema, unspecified: Secondary | ICD-10-CM

## 2014-02-28 DIAGNOSIS — G43909 Migraine, unspecified, not intractable, without status migrainosus: Secondary | ICD-10-CM | POA: Insufficient documentation

## 2014-02-28 DIAGNOSIS — Y9289 Other specified places as the place of occurrence of the external cause: Secondary | ICD-10-CM | POA: Diagnosis not present

## 2014-02-28 DIAGNOSIS — M25562 Pain in left knee: Secondary | ICD-10-CM

## 2014-02-28 DIAGNOSIS — M660 Rupture of popliteal cyst: Secondary | ICD-10-CM | POA: Diagnosis not present

## 2014-02-28 DIAGNOSIS — Y93B9 Activity, other involving muscle strengthening exercises: Secondary | ICD-10-CM | POA: Diagnosis not present

## 2014-02-28 DIAGNOSIS — Z8619 Personal history of other infectious and parasitic diseases: Secondary | ICD-10-CM | POA: Diagnosis not present

## 2014-02-28 DIAGNOSIS — M79605 Pain in left leg: Secondary | ICD-10-CM

## 2014-02-28 DIAGNOSIS — Y998 Other external cause status: Secondary | ICD-10-CM | POA: Diagnosis not present

## 2014-02-28 DIAGNOSIS — J4520 Mild intermittent asthma, uncomplicated: Secondary | ICD-10-CM | POA: Diagnosis not present

## 2014-02-28 DIAGNOSIS — K219 Gastro-esophageal reflux disease without esophagitis: Secondary | ICD-10-CM | POA: Insufficient documentation

## 2014-02-28 DIAGNOSIS — M79609 Pain in unspecified limb: Secondary | ICD-10-CM

## 2014-02-28 DIAGNOSIS — S8992XA Unspecified injury of left lower leg, initial encounter: Secondary | ICD-10-CM | POA: Insufficient documentation

## 2014-02-28 DIAGNOSIS — Z79899 Other long term (current) drug therapy: Secondary | ICD-10-CM | POA: Insufficient documentation

## 2014-02-28 DIAGNOSIS — Z8742 Personal history of other diseases of the female genital tract: Secondary | ICD-10-CM | POA: Insufficient documentation

## 2014-02-28 DIAGNOSIS — M7989 Other specified soft tissue disorders: Secondary | ICD-10-CM

## 2014-02-28 LAB — CBC WITH DIFFERENTIAL/PLATELET
BASOS PCT: 0 % (ref 0–1)
Basophils Absolute: 0 10*3/uL (ref 0.0–0.1)
Eosinophils Absolute: 0.3 10*3/uL (ref 0.0–0.7)
Eosinophils Relative: 4 % (ref 0–5)
HCT: 38.8 % (ref 36.0–46.0)
Hemoglobin: 12.5 g/dL (ref 12.0–15.0)
Lymphocytes Relative: 50 % — ABNORMAL HIGH (ref 12–46)
Lymphs Abs: 3.2 10*3/uL (ref 0.7–4.0)
MCH: 24.8 pg — ABNORMAL LOW (ref 26.0–34.0)
MCHC: 32.2 g/dL (ref 30.0–36.0)
MCV: 77 fL — ABNORMAL LOW (ref 78.0–100.0)
MONOS PCT: 6 % (ref 3–12)
Monocytes Absolute: 0.4 10*3/uL (ref 0.1–1.0)
NEUTROS ABS: 2.5 10*3/uL (ref 1.7–7.7)
NEUTROS PCT: 40 % — AB (ref 43–77)
Platelets: 258 10*3/uL (ref 150–400)
RBC: 5.04 MIL/uL (ref 3.87–5.11)
RDW: 14.6 % (ref 11.5–15.5)
WBC: 6.3 10*3/uL (ref 4.0–10.5)

## 2014-02-28 LAB — D-DIMER, QUANTITATIVE: D-Dimer, Quant: 0.78 ug{FEU}/mL — ABNORMAL HIGH (ref 0.00–0.48)

## 2014-02-28 MED ORDER — HYDROCODONE-ACETAMINOPHEN 5-325 MG PO TABS: 1.0000 | ORAL_TABLET | Freq: Four times a day (QID) | ORAL | Status: DC | PRN

## 2014-02-28 NOTE — ED Notes (Signed)
Pt sent here from [facility] to r/o DVT left leg.

## 2014-02-28 NOTE — ED Provider Notes (Signed)
CSN: [id number]     Arrival date & time 02/28/14  1719 History   First MD Initiated Contact with Patient 02/28/14 1801     Chief Complaint  Patient presents with  . Leg Pain   HPI  Natalie Vazquez is a 53 year old woman with history of asthma, GERD, arthritis, and fibromyalgia presenting with left knee pain for the last month.  She initially developed mild soreness in her knee while walking on a treadmill, and she began finding it difficult to extend her knee due to increasing swelling and pain.  Last week, she was exercising on a elliptical machine and felt a pop in her knee.  This released some of the pressure in her knee, but she has developed increasing pain in her thigh and calf since that time.  She reports ongoing swelling in her left leg.  She notes flying to [place] three weeks ago, but denies any long periods of immobility prior to developing the knee pain.  She denies history of DVT, PE, or blood clotting.  She went to urgent care where d-dimer was found to be elevated at 0.78, so she was sent to the ER to rule out DVT.  Past Medical History  Diagnosis Date  . Asthma     mild, intermittent  . Frequent urination   . GERD (gastroesophageal reflux disease)   . Headache(784.0)   . PONV (postoperative nausea and vomiting)     difficulty waking up  . Arthritis   . Migraine   . Chicken pox   . Fibroids   . Fibromyalgia   . Anemia   . Problems related to high-risk sexual behavior     Unprotected sex  . CTS (carpal tunnel syndrome)   . Cholelithiasis   . Symptomatic menopausal or female climacteric states   . Allergic rhinitis due to other allergen    Past Surgical History  Procedure Laterality Date  . Tubal ligation    . Abdominal hysterectomy  2008    partial, ovaries remain  . Laser ablation condyloma cervical / vulvar    . Cholecystectomy  05/19/2011    Procedure: LAPAROSCOPIC CHOLECYSTECTOMY;  Surgeon: Zenovia Jarred, MD;  Location: [facility];  Service: General;   Laterality: N/A;  . Percutaneous pinning  01/22/2012    Procedure: PERCUTANEOUS PINNING EXTREMITY;  Surgeon: Marybelle Killings, MD;  Location: [facility];  Service: Orthopedics;  Laterality: Right;  Closed Reduction and Pinning right 5th Metacarpal Fracture  . Wisdom tooth extraction    . Carpal tunnel release Right 2015   Family History  Problem Relation Age of Onset  . Hypertension Father     Deceased  . Stroke Father   . Diabetes Father     Legs Amputated  . Heart disease Father   . Arthritis Father   . Diabetes Mother     Deceased  . Hypertension Mother   . Anemia Mother   . Hyperlipidemia Mother   . Arthritis/Rheumatoid Mother   . Migraines Mother   . Heart attack Maternal Aunt   . Diabetes Maternal Uncle   . Migraines Maternal Aunt   . Asthma Maternal Aunt   . Asthma Maternal Uncle   . Stomach cancer Sister   . Breast cancer Sister   . Uterine cancer Sister     or Ovarian  . Sleep apnea Brother   . COPD Brother   . Asthma Sister     x2  . Sudden death Brother     SIDS  .  Hypertension Brother   . Diabetes Sister   . Asthma Daughter     x2  . Allergies Daughter   . Sickle cell trait Other     All Children  . Atrial fibrillation Daughter   . Anxiety disorder Daughter   . Allergies Son   . Cancer Father   . CVA Mother   . Hypertension Sister   . Heart disease Sister   . Asthma Brother    History  Substance Use Topics  . Smoking status: Never Smoker   . Smokeless tobacco: Never Used  . Alcohol Use: Yes     Comment: rarely    OB History    No data available     Review of Systems  Constitutional: Negative for fever, chills, activity change and appetite change.  HENT: Negative for congestion and rhinorrhea.   Eyes: Positive for itching (Left eye, thinks it is alergies.).  Respiratory: Negative for cough, chest tightness and shortness of breath.   Cardiovascular: Negative for chest pain and palpitations.  Gastrointestinal: Negative for nausea, vomiting,  diarrhea, constipation and abdominal distention.  Genitourinary: Negative for dysuria and difficulty urinating.  Musculoskeletal: Positive for myalgias, joint swelling and arthralgias.  Skin: Negative for pallor and rash.  Neurological: Negative for dizziness, weakness and numbness.      Allergies  Iodine; Shellfish allergy; and Latex  Home Medications   Prior to Admission medications   Medication Sig Start Date End Date Taking? Authorizing Provider  albuterol (PROVENTIL HFA;VENTOLIN HFA) 108 (90 BASE) MCG/ACT inhaler Inhale 1-2 puffs into the lungs every 6 (six) hours as needed for wheezing or shortness of breath.    Historical Provider, MD  Biotin 1000 MCG tablet Take 1,000 mcg by mouth daily.    Historical Provider, MD  cetirizine (ZYRTEC) 10 MG tablet TAKE 1/2 TABLET BY MOUTH EVERY DAY 02/26/14   Brunetta Jeans, PA-C  cyclobenzaprine (FLEXERIL) 10 MG tablet TAKE 1 TABLET BY MOUTH THREE TIMES DAILY AS NEEDED FOR PAIN    Brunetta Jeans, PA-C  esomeprazole (NEXIUM) 40 MG capsule TAKE 1 CAPSULE BY MOUTH EVERY MORNING BEFORE BEAKFEST 08/31/13   Brunetta Jeans, PA-C  Ginkgo Biloba 40 MG TABS Take by mouth. W/Ginseng; liquid vials    Historical Provider, MD  lubiprostone (AMITIZA) 24 MCG capsule Take 1 capsule (24 mcg total) by mouth daily with breakfast. 08/29/13   Brunetta Jeans, PA-C  Multiple Vitamins-Minerals (MULTIVITAMIN WITH MINERALS) tablet Take 1 tablet by mouth daily. Daily MVI w/Iron; Alternate w/Prenatal MVI    Historical Provider, MD  naproxen (NAPROSYN) 500 MG tablet Take 1 tablet (500 mg total) by mouth 2 (two) times daily. As needed for neck pain 10/24/13   Lutricia Feil, PA  NON FORMULARY Take 3 tablets by mouth as needed. Swiss Kriss Herbal Laxative Tab    Historical Provider, MD  OVER THE COUNTER MEDICATION Take 2 drops by mouth daily. VITAMIN D 1000 UNITS WITH VITAMIN A 250 UNITS    Historical Provider, MD  RA GARLIC/PARSLEY PO Take 3 capsules by mouth  daily. Garlic 1.2 mg/Parsley 2 mg    Historical Provider, MD  vitamin E 400 UNIT capsule Take 400 Units by mouth daily.    Historical Provider, MD  zonisamide (ZONEGRAN) 25 MG capsule TAKE 1 CAPSULE BY MOUTH DAILY AS NEEDED 03/16/13   Brunetta Jeans, PA-C   BP 125/76 mmHg  Pulse 65  Temp(Src) 98.5 F (36.9 C) (Oral)  Resp 18  SpO2 98% Physical Exam  Constitutional: She is oriented to person, place, and time. She appears well-developed and well-nourished. No distress.  HENT:  Head: Normocephalic and atraumatic.  Mouth/Throat: No oropharyngeal exudate.  Eyes: Conjunctivae and EOM are normal. Pupils are equal, round, and reactive to light. No scleral icterus.  Cardiovascular: Normal rate, regular rhythm and normal heart sounds.   Pulmonary/Chest: Effort normal and breath sounds normal. No respiratory distress.  Abdominal: Soft. Bowel sounds are normal. She exhibits no distension. There is no tenderness.  Musculoskeletal:  Exam limited due to pain.  Swelling of left knee, lower thigh, and upper calf with lateral joint effusion.  Diffuse tenderness to palpation of left posterior leg.  Unable to completely flex left leg due to pain.  Neurological: She is alert and oriented to person, place, and time. No cranial nerve deficit. She exhibits normal muscle tone.  Skin: Skin is warm and dry. No rash noted. No erythema.    ED Course  Procedures (including critical care time) Labs Review Labs Reviewed - No data to display  Imaging Review Dg Knee Complete 4 Views Left  02/28/2014   CLINICAL DATA:  Acute left knee pain.  EXAM: LEFT KNEE - COMPLETE 4+ VIEW  COMPARISON:  None.  FINDINGS: There is no evidence of fracture, dislocation, or joint effusion. There is no evidence of arthropathy or other focal bone abnormality. Soft tissues are unremarkable.  IMPRESSION: Normal left knee.   Electronically Signed   By: Sabino Dick M.D.   On: 02/28/2014 20:48     EKG Interpretation None       Left LE  venous duplex: Preliminary report: Left: No evidence of DVT or superficial thrombosis. Ruptured Baker's cyst noted in the popliteal fossa and calf.  MDM   Final diagnoses:  Knee pain, acute, left   7:19 pm: Ultrasound consistent with ruptured Baker's cyst with no evidence of DVT, and her history is consistent with this diagnosis.  Elevated d-dimer likely nonspecific with no symptoms of PE.  Cannot exclude possible co-existing musculoskeletal injury that continues to be aggravated with weight-bearing and exercise.  She is scheduled to follow up with a local ortho doctor next week, so will x-ray knee now and defer possible MRI to them.  Intra-articular injection of glucocorticoids may be necessary in future for complete resolution of cyst.  Ordered knee immobilizer and crutches. Will provide pain medications at discharge.  Advised to keep weight off knee and elevate when possible.  8:53 pm: X-ray of left knee normal.  Will discharge with prescription for Norco 5-325 mg q6h PRN #20.  She will attend her appointment with Dr. Lorin Mercy of [facility] on Friday for follow-up.   Arman Filter, MD 02/28/14 9735  Pamella Pert, MD 03/01/14 [phone]

## 2014-02-28 NOTE — ED Notes (Signed)
States she has had pain in her left knee x 1 month. ?Started after had used exercise machine . Did not improve during a trip to visit family in [place] . Got on elliptical machine at gym yesterday , felt her knee pop, then had trouble w extension on leg. Pain and swelling worse lateral aspect

## 2014-02-28 NOTE — Discharge Instructions (Signed)
-  Your knee pain is due to a ruptured Baker's cyst. -You can take ibuprofen 400 mg every 6 hours as needed along with Norco every 6 hours as needed to help with the pain. -Try to keep your knee elevated when possible and keep your weight off of the leg. -Follow-up with your orthopedic doctor on Friday to make sure your pain is improving.

## 2014-02-28 NOTE — Progress Notes (Signed)
VASCULAR LAB PRELIMINARY  PRELIMINARY  PRELIMINARY  PRELIMINARY  Left lower extremity venous duplex completed.    Preliminary report:  Left:  No evidence of DVT or superficial thrombosis. Ruptured Baker's cyst noted in the popliteal fossa and calf.  Ketih Goodie, RVT 02/28/2014, 6:47 PM

## 2014-02-28 NOTE — ED Notes (Signed)
Pt c/o L posterior knee pain x 1 month with swelling noted to L knee. Pt sent from [facility] for rule out DVT. Pulses present. Pain has worsened today. Has PCP appointment for Friday, reports pain has increased too much to wait.

## 2014-02-28 NOTE — ED Provider Notes (Signed)
CSN: [id number]     Arrival date & time 02/28/14  1529 History   None    Chief Complaint  Patient presents with  . Knee Pain   (Consider location/radiation/quality/duration/timing/severity/associated sxs/prior Treatment) HPI           53 year old female presents complaining of left knee pain. This pain began about one month ago. She had mild soreness in her knee after exercising. She then traveled to [place] and the pain got worse. She then started to have pain in the back of her knee as well that has gotten progressively worse and more swollen. At one point she was exercising and felt a pop in the back of her knee. The pain goes up her thigh and down into her calf. Pain is worse with any activity. No fever, chills, chest pain, shortness of breath.  Past Medical History  Diagnosis Date  . Asthma     mild, intermittent  . Frequent urination   . GERD (gastroesophageal reflux disease)   . Headache(784.0)   . PONV (postoperative nausea and vomiting)     difficulty waking up  . Arthritis   . Migraine   . Chicken pox   . Fibroids   . Fibromyalgia   . Anemia   . Problems related to high-risk sexual behavior     Unprotected sex  . CTS (carpal tunnel syndrome)   . Cholelithiasis   . Symptomatic menopausal or female climacteric states   . Allergic rhinitis due to other allergen    Past Surgical History  Procedure Laterality Date  . Tubal ligation    . Abdominal hysterectomy  2008    partial, ovaries remain  . Laser ablation condyloma cervical / vulvar    . Cholecystectomy  05/19/2011    Procedure: LAPAROSCOPIC CHOLECYSTECTOMY;  Surgeon: Zenovia Jarred, MD;  Location: [facility];  Service: General;  Laterality: N/A;  . Percutaneous pinning  01/22/2012    Procedure: PERCUTANEOUS PINNING EXTREMITY;  Surgeon: Marybelle Killings, MD;  Location: [place];  Service: Orthopedics;  Laterality: Right;  Closed Reduction and Pinning right 5th Metacarpal Fracture  . Wisdom tooth extraction    . Carpal tunnel  release Right 2015   Family History  Problem Relation Age of Onset  . Hypertension Father     Deceased  . Stroke Father   . Diabetes Father     Legs Amputated  . Heart disease Father   . Arthritis Father   . Diabetes Mother     Deceased  . Hypertension Mother   . Anemia Mother   . Hyperlipidemia Mother   . Arthritis/Rheumatoid Mother   . Migraines Mother   . Heart attack Maternal Aunt   . Diabetes Maternal Uncle   . Migraines Maternal Aunt   . Asthma Maternal Aunt   . Asthma Maternal Uncle   . Stomach cancer Sister   . Breast cancer Sister   . Uterine cancer Sister     or Ovarian  . Sleep apnea Brother   . COPD Brother   . Asthma Sister     x2  . Sudden death Brother     SIDS  . Hypertension Brother   . Diabetes Sister   . Asthma Daughter     x2  . Allergies Daughter   . Sickle cell trait Other     All Children  . Atrial fibrillation Daughter   . Anxiety disorder Daughter   . Allergies Son   . Cancer Father   . CVA Mother   .  Hypertension Sister   . Heart disease Sister   . Asthma Brother    History  Substance Use Topics  . Smoking status: Never Smoker   . Smokeless tobacco: Never Used  . Alcohol Use: Yes     Comment: rarely    OB History    No data available     Review of Systems  Respiratory: Negative for shortness of breath.   Cardiovascular: Positive for leg swelling. Negative for chest pain.  Musculoskeletal: Positive for arthralgias.  All other systems reviewed and are negative.   Allergies  Iodine; Shellfish allergy; and Latex  Home Medications   Prior to Admission medications   Medication Sig Start Date End Date Taking? Authorizing Provider  albuterol (PROVENTIL HFA;VENTOLIN HFA) 108 (90 BASE) MCG/ACT inhaler Inhale 1-2 puffs into the lungs every 6 (six) hours as needed for wheezing or shortness of breath.    Historical Provider, MD  Biotin 1000 MCG tablet Take 1,000 mcg by mouth daily.    Historical Provider, MD  cetirizine (ZYRTEC)  10 MG tablet TAKE 1/2 TABLET BY MOUTH EVERY DAY 02/26/14   Brunetta Jeans, PA-C  cyclobenzaprine (FLEXERIL) 10 MG tablet TAKE 1 TABLET BY MOUTH THREE TIMES DAILY AS NEEDED FOR PAIN    Brunetta Jeans, PA-C  esomeprazole (NEXIUM) 40 MG capsule TAKE 1 CAPSULE BY MOUTH EVERY MORNING BEFORE BEAKFEST 08/31/13   Brunetta Jeans, PA-C  Ginkgo Biloba 40 MG TABS Take by mouth. W/Ginseng; liquid vials    Historical Provider, MD  lubiprostone (AMITIZA) 24 MCG capsule Take 1 capsule (24 mcg total) by mouth daily with breakfast. 08/29/13   Brunetta Jeans, PA-C  Multiple Vitamins-Minerals (MULTIVITAMIN WITH MINERALS) tablet Take 1 tablet by mouth daily. Daily MVI w/Iron; Alternate w/Prenatal MVI    Historical Provider, MD  naproxen (NAPROSYN) 500 MG tablet Take 1 tablet (500 mg total) by mouth 2 (two) times daily. As needed for neck pain 10/24/13   Lutricia Feil, PA  NON FORMULARY Take 3 tablets by mouth as needed. Swiss Kriss Herbal Laxative Tab    Historical Provider, MD  OVER THE COUNTER MEDICATION Take 2 drops by mouth daily. VITAMIN D 1000 UNITS WITH VITAMIN A 250 UNITS    Historical Provider, MD  RA GARLIC/PARSLEY PO Take 3 capsules by mouth daily. Garlic 1.2 mg/Parsley 2 mg    Historical Provider, MD  vitamin E 400 UNIT capsule Take 400 Units by mouth daily.    Historical Provider, MD  zonisamide (ZONEGRAN) 25 MG capsule TAKE 1 CAPSULE BY MOUTH DAILY AS NEEDED 03/16/13   Brunetta Jeans, PA-C   BP 116/79 mmHg  Pulse 75  Temp(Src) 98.1 F (36.7 C) (Oral)  Resp 16  SpO2 96% Physical Exam  Constitutional: She is oriented to person, place, and time. Vital signs are normal. She appears well-developed and well-nourished. No distress.  HENT:  Head: Normocephalic and atraumatic.  Cardiovascular:  Pulses:      Dorsalis pedis pulses are 2+ on the left side.  Pulmonary/Chest: Effort normal. No respiratory distress.  Musculoskeletal:       Left knee: She exhibits swelling (minimal swelling above  the knee.examination of the posterior knee reveals swelling of the proximal calf is tender to palpation.). She exhibits no LCL laxity, normal patellar mobility, no bony tenderness, normal meniscus and no MCL laxity. Tenderness found. Medial joint line and lateral joint line tenderness noted.  Negative Homans sign  Neurological: She is alert and oriented to person, place, and time.  She has normal strength. Coordination normal.  Skin: Skin is warm and dry. No rash noted. She is not diaphoretic.  Psychiatric: She has a normal mood and affect. Judgment normal.  Nursing note and vitals reviewed.   ED Course  Procedures (including critical care time) Labs Review Labs Reviewed  D-DIMER, QUANTITATIVE - Abnormal; Notable for the following:    D-Dimer, Quant 0.78 (*)    All other components within normal limits  CBC WITH DIFFERENTIAL - Abnormal; Notable for the following:    MCV 77.0 (*)    MCH 24.8 (*)    Neutrophils Relative % 40 (*)    Lymphocytes Relative 50 (*)    All other components within normal limits    Imaging Review No results found.   MDM   1. Knee pain, left   2. Left leg pain   3. Left leg swelling    Concern for DVT, especially with positive d-dimer. Transferred to the emergency department via shuttle for further evaluation and imaging.       Liam Graham, PA-C 02/28/14 [phone]

## 2014-03-07 ENCOUNTER — Other Ambulatory Visit: Payer: Self-pay | Admitting: Orthopaedic Surgery

## 2014-03-07 DIAGNOSIS — M25562 Pain in left knee: Secondary | ICD-10-CM

## 2014-03-15 ENCOUNTER — Ambulatory Visit: Payer: Federal, State, Local not specified - PPO | Admitting: Dietician

## 2014-03-18 ENCOUNTER — Ambulatory Visit
Admission: RE | Admit: 2014-03-18 | Discharge: 2014-03-18 | Disposition: A | Discharge status: Home / Self Care | Payer: Federal, State, Local not specified - PPO | Source: Ambulatory Visit | Attending: Orthopaedic Surgery | Admitting: Orthopaedic Surgery

## 2014-03-18 DIAGNOSIS — M25562 Pain in left knee: Secondary | ICD-10-CM

## 2014-03-21 ENCOUNTER — Ambulatory Visit (HOSPITAL_COMMUNITY)
Admission: RE | Admit: 2014-03-21 | Discharge: 2014-03-21 | Disposition: A | Discharge status: Home / Self Care | Payer: Federal, State, Local not specified - PPO | Source: Ambulatory Visit | Attending: Obstetrics | Admitting: Obstetrics

## 2014-03-21 DIAGNOSIS — Z780 Asymptomatic menopausal state: Secondary | ICD-10-CM | POA: Insufficient documentation

## 2014-03-21 DIAGNOSIS — Z13820 Encounter for screening for osteoporosis: Secondary | ICD-10-CM | POA: Insufficient documentation

## 2014-03-21 DIAGNOSIS — Z1231 Encounter for screening mammogram for malignant neoplasm of breast: Secondary | ICD-10-CM

## 2014-03-22 ENCOUNTER — Other Ambulatory Visit: Payer: Self-pay | Admitting: Obstetrics

## 2014-03-22 DIAGNOSIS — R928 Other abnormal and inconclusive findings on diagnostic imaging of breast: Secondary | ICD-10-CM

## 2014-03-28 ENCOUNTER — Ambulatory Visit
Admission: RE | Admit: 2014-03-28 | Discharge: 2014-03-28 | Disposition: A | Discharge status: Home / Self Care | Payer: Federal, State, Local not specified - PPO | Source: Ambulatory Visit | Attending: Obstetrics | Admitting: Obstetrics

## 2014-03-28 DIAGNOSIS — R928 Other abnormal and inconclusive findings on diagnostic imaging of breast: Secondary | ICD-10-CM

## 2014-04-10 ENCOUNTER — Other Ambulatory Visit: Payer: Federal, State, Local not specified - PPO

## 2014-05-16 ENCOUNTER — Other Ambulatory Visit: Payer: Self-pay | Admitting: Physician Assistant

## 2014-05-16 NOTE — Telephone Encounter (Signed)
Rx request to pharmacy/SLS  

## 2014-06-18 ENCOUNTER — Other Ambulatory Visit: Payer: Self-pay | Admitting: Physician Assistant

## 2014-06-18 NOTE — Telephone Encounter (Signed)
Patient requesting Flexeril.  Last seen 09/07/13 for CPE.  Last refilled 06/19/13 for 30 with 0.    Please advise. eal

## 2014-09-17 ENCOUNTER — Other Ambulatory Visit: Payer: Self-pay | Admitting: Physician Assistant

## 2014-12-02 ENCOUNTER — Telehealth: Payer: Self-pay | Admitting: Physician Assistant

## 2014-12-02 DIAGNOSIS — K589 Irritable bowel syndrome without diarrhea: Secondary | ICD-10-CM

## 2014-12-03 NOTE — Telephone Encounter (Signed)
Rx request Denied, patient needs F/U appointment;  Please have patient schedule OV prior to refill authorization/SLS Thanks.

## 2014-12-03 NOTE — Telephone Encounter (Signed)
lvm advising patient to schedule appointment °

## 2014-12-05 MED ORDER — LUBIPROSTONE 24 MCG PO CAPS: 24.0000 ug | ORAL_CAPSULE | Freq: Every day | ORAL | Status: DC

## 2014-12-05 NOTE — Telephone Encounter (Signed)
Short term supply of requested medication sent to patient's pharmacy, must keep upcoming appointment prior to future refill request/SLS Thanks.

## 2014-12-05 NOTE — Telephone Encounter (Signed)
Pt in training class at work for 2 weeks. Scheduled cpe for 12/18/14. Asked if you could send in 2 week supply of med for her til she comes in.

## 2014-12-06 NOTE — Telephone Encounter (Signed)
Left msg to notify pt °

## 2014-12-14 ENCOUNTER — Encounter: Payer: Self-pay | Admitting: *Deleted

## 2014-12-14 ENCOUNTER — Telehealth: Payer: Self-pay | Admitting: *Deleted

## 2014-12-14 NOTE — Telephone Encounter (Signed)
Unable to reach patient at time of Pre-Visit Call.  Left message for patient to return call when available.    

## 2014-12-14 NOTE — Addendum Note (Signed)
Addended by: Leticia Penna A on: 12/14/2014 11:42 AM   Modules accepted: Orders, Medications

## 2014-12-14 NOTE — Telephone Encounter (Signed)
Pre-Visit Call completed with patient and chart updated.   Pre-Visit Info documented in Specialty Comments under [employer].    

## 2014-12-18 ENCOUNTER — Telehealth: Payer: Self-pay | Admitting: Physician Assistant

## 2014-12-18 ENCOUNTER — Ambulatory Visit: Payer: Federal, State, Local not specified - PPO | Admitting: Physician Assistant

## 2014-12-18 ENCOUNTER — Encounter: Payer: Federal, State, Local not specified - PPO | Admitting: Physician Assistant

## 2014-12-18 NOTE — Telephone Encounter (Signed)
Charge. 

## 2014-12-18 NOTE — Telephone Encounter (Signed)
Pt was no show today 12/18/14 8:00am, cpe appt, pt left VM 12/18/14 8:22am stating she just woke up, rescheduled for 12/28/14 9:30am, charge for no show?

## 2014-12-28 ENCOUNTER — Encounter: Payer: Self-pay | Admitting: Physician Assistant

## 2014-12-28 ENCOUNTER — Ambulatory Visit (INDEPENDENT_AMBULATORY_CARE_PROVIDER_SITE_OTHER): Payer: Federal, State, Local not specified - PPO | Admitting: Physician Assistant

## 2014-12-28 VITALS — BP 104/72 | HR 74 | Temp 97.8°F | Resp 16 | Ht 67.0 in | Wt 226.1 lb

## 2014-12-28 DIAGNOSIS — Z0000 Encounter for general adult medical examination without abnormal findings: Secondary | ICD-10-CM | POA: Diagnosis not present

## 2014-12-28 DIAGNOSIS — K219 Gastro-esophageal reflux disease without esophagitis: Secondary | ICD-10-CM | POA: Diagnosis not present

## 2014-12-28 DIAGNOSIS — K589 Irritable bowel syndrome without diarrhea: Secondary | ICD-10-CM | POA: Diagnosis not present

## 2014-12-28 DIAGNOSIS — J00 Acute nasopharyngitis [common cold]: Secondary | ICD-10-CM

## 2014-12-28 DIAGNOSIS — J208 Acute bronchitis due to other specified organisms: Secondary | ICD-10-CM

## 2014-12-28 DIAGNOSIS — B9689 Other specified bacterial agents as the cause of diseases classified elsewhere: Secondary | ICD-10-CM

## 2014-12-28 LAB — LIPID PANEL
CHOL/HDL RATIO: 2
Cholesterol: 124 mg/dL (ref 0–200)
HDL: 52.1 mg/dL (ref >39.00)
LDL Cholesterol: 62 mg/dL (ref 0–99)
NONHDL: 71.99
Triglycerides: 50 mg/dL (ref 0.0–149.0)
VLDL: 10 mg/dL (ref 0.0–40.0)

## 2014-12-28 LAB — URINALYSIS, ROUTINE W REFLEX MICROSCOPIC
Bilirubin Urine: NEGATIVE
Ketones, ur: NEGATIVE
Leukocytes, UA: NEGATIVE
Nitrite: NEGATIVE
PH: 6 (ref 5.0–8.0)
SPECIFIC GRAVITY, URINE: 1.015 (ref 1.000–1.030)
Total Protein, Urine: NEGATIVE
Urine Glucose: NEGATIVE
Urobilinogen, UA: 0.2 (ref 0.0–1.0)

## 2014-12-28 LAB — HEPATIC FUNCTION PANEL
ALK PHOS: 65 U/L (ref 39–117)
ALT: 19 U/L (ref 0–35)
AST: 21 U/L (ref 0–37)
Albumin: 4.1 g/dL (ref 3.5–5.2)
BILIRUBIN DIRECT: 0.1 mg/dL (ref 0.0–0.3)
Total Bilirubin: 0.3 mg/dL (ref 0.2–1.2)
Total Protein: 8 g/dL (ref 6.0–8.3)

## 2014-12-28 LAB — BASIC METABOLIC PANEL
BUN: 8 mg/dL (ref 6–23)
CALCIUM: 9.3 mg/dL (ref 8.4–10.5)
CO2: 29 mEq/L (ref 19–32)
Chloride: 103 mEq/L (ref 96–112)
Creatinine, Ser: 0.84 mg/dL (ref 0.40–1.20)
GFR: 91.38 mL/min (ref >60.00)
Glucose, Bld: 99 mg/dL (ref 70–99)
Potassium: 3.9 mEq/L (ref 3.5–5.1)
SODIUM: 139 meq/L (ref 135–145)

## 2014-12-28 LAB — CBC
HEMATOCRIT: 40.5 % (ref 36.0–46.0)
Hemoglobin: 13.1 g/dL (ref 12.0–15.0)
MCHC: 32.2 g/dL (ref 30.0–36.0)
MCV: 80.2 fl (ref 78.0–100.0)
Platelets: 230 10*3/uL (ref 150.0–400.0)
RBC: 5.05 Mil/uL (ref 3.87–5.11)
RDW: 14.9 % (ref 11.5–15.5)
WBC: 5.1 10*3/uL (ref 4.0–10.5)

## 2014-12-28 LAB — HEPATITIS C ANTIBODY: HCV AB: NEGATIVE

## 2014-12-28 LAB — HEMOGLOBIN A1C: HEMOGLOBIN A1C: 6.2 % (ref 4.6–6.5)

## 2014-12-28 LAB — TSH: TSH: 0.94 u[IU]/mL (ref 0.35–4.50)

## 2014-12-28 MED ORDER — SUMATRIPTAN SUCCINATE 25 MG PO TABS: 25.0000 mg | ORAL_TABLET | Freq: Once | ORAL | Status: DC

## 2014-12-28 MED ORDER — FLUTICASONE PROPIONATE 50 MCG/ACT NA SUSP: 2.0000 | Freq: Every day | NASAL | Status: DC

## 2014-12-28 MED ORDER — ESOMEPRAZOLE MAGNESIUM 40 MG PO CPDR: DELAYED_RELEASE_CAPSULE | ORAL | Status: DC

## 2014-12-28 MED ORDER — ZONISAMIDE 25 MG PO CAPS: 25.0000 mg | ORAL_CAPSULE | Freq: Every day | ORAL | Status: DC | PRN

## 2014-12-28 MED ORDER — LUBIPROSTONE 24 MCG PO CAPS: 24.0000 ug | ORAL_CAPSULE | Freq: Every day | ORAL | Status: DC

## 2014-12-28 MED ORDER — ALBUTEROL SULFATE HFA 108 (90 BASE) MCG/ACT IN AERS: 1.0000 | INHALATION_SPRAY | Freq: Four times a day (QID) | RESPIRATORY_TRACT | Status: DC | PRN

## 2014-12-28 MED ORDER — AZITHROMYCIN 250 MG PO TABS: ORAL_TABLET | ORAL | Status: DC

## 2014-12-28 NOTE — Progress Notes (Signed)
Patient presents to clinic today for annual exam.  Patient is fasting for labs.  Acute Concerns: Patient c/o > 1 week of productive cough, rhinorrhea, sinus pressure and chest congestion. Denies fever, chills, chest pain or shortness of breath. Endorses sinus headache and sinus pain. Is taking her Allergy medications without relief in symptoms.   Chronic Issues: GERD -- Has been taking Nexium every other day with good relief of symptoms. Denies breakthrough symptoms with medication.  IBS -- Is taking amitiza daily with relief of symptoms.   Health Maintenance: Dental -- up-to-date Vision -- up-to-date Immunizations -- Tetanus up-to-date Colonoscopy -- up-to-date Mammogram -- 03/28/14. Will need repeat this year.  Past Medical History  Diagnosis Date  . Asthma     mild, intermittent  . Frequent urination   . GERD (gastroesophageal reflux disease)   . Headache(784.0)   . PONV (postoperative nausea and vomiting)     difficulty waking up  . Arthritis   . Migraine   . Chicken pox   . Fibroids   . Fibromyalgia   . Anemia   . Problems related to high-risk sexual behavior     Unprotected sex  . CTS (carpal tunnel syndrome)   . Cholelithiasis   . Symptomatic menopausal or female climacteric states   . Allergic rhinitis due to other allergen   . Allergy     seasonal    Past Surgical History  Procedure Laterality Date  . Tubal ligation    . Abdominal hysterectomy  2008    partial, ovaries remain  . Laser ablation condyloma cervical / vulvar    . Cholecystectomy  05/19/2011    Procedure: LAPAROSCOPIC CHOLECYSTECTOMY;  Surgeon: Zenovia Jarred, MD;  Location: [facility] [facility];  Service: General;  Laterality: N/A;  . Percutaneous pinning  01/22/2012    Procedure: PERCUTANEOUS PINNING EXTREMITY;  Surgeon: Marybelle Killings, MD;  Location: [place];  Service: Orthopedics;  Laterality: Right;  Closed Reduction and Pinning right 5th Metacarpal Fracture  . Wisdom tooth extraction    . Carpal  tunnel release Right 2015    Current Outpatient Prescriptions on File Prior to Visit  Medication Sig Dispense Refill  . Biotin 1000 MCG tablet Take 1,000 mcg by mouth daily.    . cetirizine (ZYRTEC) 10 MG tablet TAKE 1/2 TABLET BY MOUTH EVERY DAY 30 tablet 5  . cyclobenzaprine (FLEXERIL) 10 MG tablet TAKE 1 TABLET BY MOUTH THREE TIMES DAILY AS NEEDED FOR PAIN 30 tablet 0  . HYDROcodone-acetaminophen (NORCO) 5-325 MG per tablet Take 1 tablet by mouth every 6 (six) hours as needed for moderate pain. 20 tablet 0  . Multiple Vitamins-Minerals (MULTIVITAMIN WITH MINERALS) tablet Take 1 tablet by mouth daily. Daily MVI w/Iron; Alternate w/Prenatal MVI    . NON FORMULARY Take 3 tablets by mouth as needed. Swiss Kriss Herbal Laxative Tab    . OVER THE COUNTER MEDICATION Take 2 drops by mouth daily. VITAMIN D 1000 UNITS WITH VITAMIN A 250 UNITS    . OVER THE COUNTER MEDICATION Take 1 tablet by mouth daily. Herbalife cell activator    . vitamin E 400 UNIT capsule Take 400 Units by mouth daily.    . [DISCONTINUED] phentermine 37.5 MG capsule Take 37.5 mg by mouth every morning.     No current facility-administered medications on file prior to visit.    Allergies  Allergen Reactions  . Iodine Anaphylaxis  . Shellfish Allergy Anaphylaxis  . Latex Rash    Family History  Problem Relation  Age of Onset  . Hypertension Father     Deceased  . Stroke Father   . Diabetes Father     Legs Amputated  . Heart disease Father   . Arthritis Father   . Diabetes Mother     Deceased  . Hypertension Mother   . Anemia Mother   . Hyperlipidemia Mother   . Arthritis/Rheumatoid Mother   . Migraines Mother   . Heart attack Maternal Aunt   . Diabetes Maternal Uncle   . Migraines Maternal Aunt   . Asthma Maternal Aunt   . Asthma Maternal Uncle   . Stomach cancer Sister   . Breast cancer Sister   . Uterine cancer Sister     or Ovarian  . Sleep apnea Brother   . COPD Brother   . Asthma Sister     x2    . Sudden death Brother     SIDS  . Hypertension Brother   . Diabetes Sister   . Asthma Daughter     x2  . Allergies Daughter   . Sickle cell trait Other     All Children  . Atrial fibrillation Daughter   . Anxiety disorder Daughter   . Allergies Son   . Cancer Father   . CVA Mother   . Hypertension Sister   . Heart disease Sister   . Asthma Brother     Social History   Social History  . Marital Status: Single    Spouse Name: N/A  . Number of Children: N/A  . Years of Education: N/A   Occupational History  . Not on file.   Social History Main Topics  . Smoking status: Never Smoker   . Smokeless tobacco: Never Used  . Alcohol Use: Yes     Comment: rarely   . Drug Use: No  . Sexual Activity: No   Other Topics Concern  . Not on file   Social History Narrative   Review of Systems  Constitutional: Negative for fever and weight loss.  HENT: Negative for ear discharge, ear pain, hearing loss and tinnitus.   Eyes: Negative for blurred vision, double vision, photophobia and pain.  Respiratory: Positive for cough and sputum production. Negative for shortness of breath.   Cardiovascular: Negative for chest pain and palpitations.  Gastrointestinal: Negative for heartburn, nausea, vomiting, abdominal pain, diarrhea, constipation, blood in stool and melena.  Genitourinary: Negative for dysuria, urgency, frequency, hematuria and flank pain.  Musculoskeletal: Negative for falls.  Neurological: Negative for dizziness, loss of consciousness and headaches.  Endo/Heme/Allergies: Negative for environmental allergies.  Psychiatric/Behavioral: Negative for depression, suicidal ideas, hallucinations and substance abuse. The patient is not nervous/anxious and does not have insomnia.     BP 104/72 mmHg  Pulse 74  Temp(Src) 97.8 F (36.6 C) (Oral)  Resp 16  Ht 5\' 7"  (1.702 m)  Wt 226 lb 2 oz (102.57 kg)  BMI 35.41 kg/m2  SpO2 98%  Physical Exam  Constitutional: She is  oriented to person, place, and time and well-developed, well-nourished, and in no distress.  HENT:  Head: Normocephalic and atraumatic.  Right Ear: Tympanic membrane, external ear and ear canal normal.  Left Ear: Tympanic membrane, external ear and ear canal normal.  Nose: Nose normal. No mucosal edema.  Mouth/Throat: Uvula is midline, oropharynx is clear and moist and mucous membranes are normal. No oropharyngeal exudate or posterior oropharyngeal erythema.  Eyes: Conjunctivae are normal. Pupils are equal, round, and reactive to light.  Neck: Neck supple. No  thyromegaly present.  Cardiovascular: Normal rate, regular rhythm, normal heart sounds and intact distal pulses.   Pulmonary/Chest: Effort normal and breath sounds normal. No respiratory distress. She has no wheezes. She has no rales.  Abdominal: Soft. Bowel sounds are normal. She exhibits no distension and no mass. There is no tenderness. There is no rebound and no guarding.  Lymphadenopathy:    She has no cervical adenopathy.  Neurological: She is alert and oriented to person, place, and time. No cranial nerve deficit.  Skin: Skin is warm and dry. No rash noted.  Psychiatric: Affect normal.  Vitals reviewed.   Recent Results (from the past 2160 hour(s))  CBC     Status: None   Collection Time: 12/28/14 10:06 AM  Result Value Ref Range   WBC 5.1 4.0 - 10.5 K/uL   RBC 5.05 3.87 - 5.11 Mil/uL   Platelets 230.0 150.0 - 400.0 K/uL   Hemoglobin 13.1 12.0 - 15.0 g/dL   HCT 40.5 36.0 - 46.0 %   MCV 80.2 78.0 - 100.0 fl   MCHC 32.2 30.0 - 36.0 g/dL   RDW 14.9 11.5 - 19.5 %  Basic metabolic panel     Status: None   Collection Time: 12/28/14 10:06 AM  Result Value Ref Range   Sodium 139 135 - 145 mEq/L   Potassium 3.9 3.5 - 5.1 mEq/L   Chloride 103 96 - 112 mEq/L   CO2 29 19 - 32 mEq/L   Glucose, Bld 99 70 - 99 mg/dL   BUN 8 6 - 23 mg/dL   Creatinine, Ser 0.84 0.40 - 1.20 mg/dL   Calcium 9.3 8.4 - 10.5 mg/dL   GFR 91.38 >60.00  mL/min  Hepatic function panel     Status: None   Collection Time: 12/28/14 10:06 AM  Result Value Ref Range   Total Bilirubin 0.3 0.2 - 1.2 mg/dL   Bilirubin, Direct 0.1 0.0 - 0.3 mg/dL   Alkaline Phosphatase 65 39 - 117 U/L   AST 21 0 - 37 U/L   ALT 19 0 - 35 U/L   Total Protein 8.0 6.0 - 8.3 g/dL   Albumin 4.1 3.5 - 5.2 g/dL  Lipid panel     Status: None   Collection Time: 12/28/14 10:06 AM  Result Value Ref Range   Cholesterol 124 0 - 200 mg/dL    Comment: ATP III Classification       Desirable:  < 200 mg/dL               Borderline High:  200 - 239 mg/dL          High:  > = 240 mg/dL   Triglycerides 50.0 0.0 - 149.0 mg/dL    Comment: Normal:  <150 mg/dLBorderline High:  150 - 199 mg/dL   HDL 52.10 >39.00 mg/dL   VLDL 10.0 0.0 - 40.0 mg/dL   LDL Cholesterol 62 0 - 99 mg/dL   Total CHOL/HDL Ratio 2     Comment:                Men          Women1/2 Average Risk     3.4          3.3Average Risk          5.0          4.42X Average Risk          9.6          7.13X Average Risk  15.0          11.0                       NonHDL 71.99     Comment: NOTE:  Non-HDL goal should be 30 mg/dL higher than patient's LDL goal (i.e. LDL goal of < 70 mg/dL, would have non-HDL goal of < 100 mg/dL)  Hemoglobin A1c     Status: None   Collection Time: 12/28/14 10:06 AM  Result Value Ref Range   Hgb A1c MFr Bld 6.2 4.6 - 6.5 %    Comment: Glycemic Control Guidelines for People with Diabetes:Non Diabetic:  <6%Goal of Therapy: <7%Additional Action Suggested:  >8%   TSH     Status: None   Collection Time: 12/28/14 10:06 AM  Result Value Ref Range   TSH 0.94 0.35 - 4.50 uIU/mL  Urinalysis, Routine w reflex microscopic (not at [facility])     Status: Abnormal   Collection Time: 12/28/14 10:06 AM  Result Value Ref Range   Color, Urine YELLOW Yellow;Lt. Yellow   APPearance CLEAR Clear   Specific Gravity, Urine 1.015 1.000-1.030   pH 6.0 5.0 - 8.0   Total Protein, Urine NEGATIVE Negative   Urine  Glucose NEGATIVE Negative   Ketones, ur NEGATIVE Negative   Bilirubin Urine NEGATIVE Negative   Hgb urine dipstick TRACE-LYSED (A) Negative   Urobilinogen, UA 0.2 0.0 - 1.0   Leukocytes, UA NEGATIVE Negative   Nitrite NEGATIVE Negative   WBC, UA 0-2/hpf 0-2/hpf   RBC / HPF 0-2/hpf 0-2/hpf   Squamous Epithelial / LPF Rare(0-4/hpf) Rare(0-4/hpf)  HIV antibody (with reflex)     Status: None   Collection Time: 12/28/14 10:06 AM  Result Value Ref Range   HIV 1&2 Ab, 4th Generation NONREACTIVE NONREACTIVE    Comment:   HIV-1 antigen and HIV-1/HIV-2 antibodies were not detected.  There is no laboratory evidence of HIV infection.   HIV-1/2 Antibody Diff        Not indicated. HIV-1 RNA, Qual TMA          Not indicated.     PLEASE NOTE: This information has been disclosed to you from records whose confidentiality may be protected by state law. If your state requires such protection, then the state law prohibits you from making any further disclosure of the information without the specific written consent of the person to whom it pertains, or as otherwise permitted by law. A general authorization for the release of medical or other information is NOT sufficient for this purpose.   The performance of this assay has not been clinically validated in patients less than 66 years old.   For additional information please refer to http://education.[facility].com/faq/FAQ106.  (This link is being provided for informational/educational purposes only.)     Hepatitis C Antibody     Status: None   Collection Time: 12/28/14 10:06 AM  Result Value Ref Range   HCV Ab NEGATIVE NEGATIVE    Assessment/Plan: Visit for preventive health examination Depression screen negative. Health Maintenance reviewed -- up-to-date. Preventive schedule discussed and handout given in AVS. Will obtain fasting labs today.  IBS (irritable bowel syndrome) Stable. Continue current medication.  GERD  (gastroesophageal reflux disease) Stable. Continue current regimen.  Acute bacterial bronchitis Rx Azithromycin. Increase fluids. Continue albuterol. Supportive measures and OTC medications reviewed. Follow-up if symptoms are not resolving.

## 2014-12-28 NOTE — Patient Instructions (Signed)
Please continue medications as directed.  For bronchitis, take antibiotic as directed. Get some OTC Delsym and take as directed if needed for cough. Increase fluids and get plenty of rest. Place a humidifier in the bedroom. Continue your Zyrect but start Flonase daily. Follow-up if symptoms are not resolving.  If all labs look good, we will follow-up yearly for physicals. If anything is abnormal, we will treat you accordingly and get you in office for follow-up.  Preventive Care for Adults A healthy lifestyle and preventive care can promote health and wellness. Preventive health guidelines for women include the following key practices.  A routine yearly physical is a good way to check with your health care provider about your health and preventive screening. It is a chance to share any concerns and updates on your health and to receive a thorough exam.  Visit your dentist for a routine exam and preventive care every 6 months. Brush your teeth twice a day and floss once a day. Good oral hygiene prevents tooth decay and gum disease.  The frequency of eye exams is based on your age, health, family medical history, use of contact lenses, and other factors. Follow your health care provider's recommendations for frequency of eye exams.  Eat a healthy diet. Foods like vegetables, fruits, whole grains, low-fat dairy products, and lean protein foods contain the nutrients you need without too many calories. Decrease your intake of foods high in solid fats, added sugars, and salt. Eat the right amount of calories for you.Get information about a proper diet from your health care provider, if necessary.  Regular physical exercise is one of the most important things you can do for your health. Most adults should get at least 150 minutes of moderate-intensity exercise (any activity that increases your heart rate and causes you to sweat) each week. In addition, most adults need muscle-strengthening exercises on 2  or more days a week.  Maintain a healthy weight. The body mass index (BMI) is a screening tool to identify possible weight problems. It provides an estimate of body fat based on height and weight. Your health care provider can find your BMI and can help you achieve or maintain a healthy weight.For adults 20 years and older:  A BMI below 18.5 is considered underweight.  A BMI of 18.5 to 24.9 is normal.  A BMI of 25 to 29.9 is considered overweight.  A BMI of 30 and above is considered obese.  Maintain normal blood lipids and cholesterol levels by exercising and minimizing your intake of saturated fat. Eat a balanced diet with plenty of fruit and vegetables. Blood tests for lipids and cholesterol should begin at age 49 and be repeated every 5 years. If your lipid or cholesterol levels are high, you are over 50, or you are at high risk for heart disease, you may need your cholesterol levels checked more frequently.Ongoing high lipid and cholesterol levels should be treated with medicines if diet and exercise are not working.  If you smoke, find out from your health care provider how to quit. If you do not use tobacco, do not start.  Lung cancer screening is recommended for adults aged 6-80 years who are at high risk for developing lung cancer because of a history of smoking. A yearly low-dose CT scan of the lungs is recommended for people who have at least a 30-pack-year history of smoking and are a current smoker or have quit within the past 15 years. A pack year of smoking is  smoking an average of 1 pack of cigarettes a day for 1 year (for example: 1 pack a day for 30 years or 2 packs a day for 15 years). Yearly screening should continue until the smoker has stopped smoking for at least 15 years. Yearly screening should be stopped for people who develop a health problem that would prevent them from having lung cancer treatment.  If you are pregnant, do not drink alcohol. If you are breastfeeding,  be very cautious about drinking alcohol. If you are not pregnant and choose to drink alcohol, do not have more than 1 drink per day. One drink is considered to be 12 ounces (355 mL) of beer, 5 ounces (148 mL) of wine, or 1.5 ounces (44 mL) of liquor.  Avoid use of street drugs. Do not share needles with anyone. Ask for help if you need support or instructions about stopping the use of drugs.  High blood pressure causes heart disease and increases the risk of stroke. Your blood pressure should be checked at least every 1 to 2 years. Ongoing high blood pressure should be treated with medicines if weight loss and exercise do not work.  If you are 24-7 years old, ask your health care provider if you should take aspirin to prevent strokes.  Diabetes screening involves taking a blood sample to check your fasting blood sugar level. This should be done once every 3 years, after age 22, if you are within normal weight and without risk factors for diabetes. Testing should be considered at a younger age or be carried out more frequently if you are overweight and have at least 1 risk factor for diabetes.  Breast cancer screening is essential preventive care for women. You should practice "breast self-awareness." This means understanding the normal appearance and feel of your breasts and may include breast self-examination. Any changes detected, no matter how small, should be reported to a health care provider. Women in their 58s and 30s should have a clinical breast exam (CBE) by a health care provider as part of a regular health exam every 1 to 3 years. After age 59, women should have a CBE every year. Starting at age 24, women should consider having a mammogram (breast X-ray test) every year. Women who have a family history of breast cancer should talk to their health care provider about genetic screening. Women at a high risk of breast cancer should talk to their health care providers about having an MRI and a  mammogram every year.  Breast cancer gene (BRCA)-related cancer risk assessment is recommended for women who have family members with BRCA-related cancers. BRCA-related cancers include breast, ovarian, tubal, and peritoneal cancers. Having family members with these cancers may be associated with an increased risk for harmful changes (mutations) in the breast cancer genes BRCA1 and BRCA2. Results of the assessment will determine the need for genetic counseling and BRCA1 and BRCA2 testing.  Routine pelvic exams to screen for cancer are no longer recommended for nonpregnant women who are considered low risk for cancer of the pelvic organs (ovaries, uterus, and vagina) and who do not have symptoms. Ask your health care provider if a screening pelvic exam is right for you.  If you have had past treatment for cervical cancer or a condition that could lead to cancer, you need Pap tests and screening for cancer for at least 20 years after your treatment. If Pap tests have been discontinued, your risk factors (such as having a new sexual partner) need  to be reassessed to determine if screening should be resumed. Some women have medical problems that increase the chance of getting cervical cancer. In these cases, your health care provider may recommend more frequent screening and Pap tests.  The HPV test is an additional test that may be used for cervical cancer screening. The HPV test looks for the virus that can cause the cell changes on the cervix. The cells collected during the Pap test can be tested for HPV. The HPV test could be used to screen women aged 36 years and older, and should be used in women of any age who have unclear Pap test results. After the age of 17, women should have HPV testing at the same frequency as a Pap test.  Colorectal cancer can be detected and often prevented. Most routine colorectal cancer screening begins at the age of 68 years and continues through age 34 years. However, your  health care provider may recommend screening at an earlier age if you have risk factors for colon cancer. On a yearly basis, your health care provider may provide home test kits to check for hidden blood in the stool. Use of a small camera at the end of a tube, to directly examine the colon (sigmoidoscopy or colonoscopy), can detect the earliest forms of colorectal cancer. Talk to your health care provider about this at age 27, when routine screening begins. Direct exam of the colon should be repeated every 5-10 years through age 42 years, unless early forms of pre-cancerous polyps or small growths are found.  People who are at an increased risk for hepatitis B should be screened for this virus. You are considered at high risk for hepatitis B if:  You were born in a country where hepatitis B occurs often. Talk with your health care provider about which countries are considered high risk.  Your parents were born in a high-risk country and you have not received a shot to protect against hepatitis B (hepatitis B vaccine).  You have HIV or AIDS.  You use needles to inject street drugs.  You live with, or have sex with, someone who has hepatitis B.  You get hemodialysis treatment.  You take certain medicines for conditions like cancer, organ transplantation, and autoimmune conditions.  Hepatitis C blood testing is recommended for all people born from [birth year] through 1965 and any individual with known risks for hepatitis C.  Practice safe sex. Use condoms and avoid high-risk sexual practices to reduce the spread of sexually transmitted infections (STIs). STIs include gonorrhea, chlamydia, syphilis, trichomonas, herpes, HPV, and human immunodeficiency virus (HIV). Herpes, HIV, and HPV are viral illnesses that have no cure. They can result in disability, cancer, and death.  You should be screened for sexually transmitted illnesses (STIs) including gonorrhea and chlamydia if:  You are sexually active  and are younger than 24 years.  You are older than 24 years and your health care provider tells you that you are at risk for this type of infection.  Your sexual activity has changed since you were last screened and you are at an increased risk for chlamydia or gonorrhea. Ask your health care provider if you are at risk.  If you are at risk of being infected with HIV, it is recommended that you take a prescription medicine daily to prevent HIV infection. This is called preexposure prophylaxis (PrEP). You are considered at risk if:  You are a heterosexual woman, are sexually active, and are at increased risk for HIV  infection.  You take drugs by injection.  You are sexually active with a partner who has HIV.  Talk with your health care provider about whether you are at high risk of being infected with HIV. If you choose to begin PrEP, you should first be tested for HIV. You should then be tested every 3 months for as long as you are taking PrEP.  Osteoporosis is a disease in which the bones lose minerals and strength with aging. This can result in serious bone fractures or breaks. The risk of osteoporosis can be identified using a bone density scan. Women ages 67 years and over and women at risk for fractures or osteoporosis should discuss screening with their health care providers. Ask your health care provider whether you should take a calcium supplement or vitamin D to reduce the rate of osteoporosis.  Menopause can be associated with physical symptoms and risks. Hormone replacement therapy is available to decrease symptoms and risks. You should talk to your health care provider about whether hormone replacement therapy is right for you.  Use sunscreen. Apply sunscreen liberally and repeatedly throughout the day. You should seek shade when your shadow is shorter than you. Protect yourself by wearing long sleeves, pants, a wide-brimmed hat, and sunglasses year round, whenever you are  outdoors.  Once a month, do a whole body skin exam, using a mirror to look at the skin on your back. Tell your health care provider of new moles, moles that have irregular borders, moles that are larger than a pencil eraser, or moles that have changed in shape or color.  Stay current with required vaccines (immunizations).  Influenza vaccine. All adults should be immunized every year.  Tetanus, diphtheria, and acellular pertussis (Td, Tdap) vaccine. Pregnant women should receive 1 dose of Tdap vaccine during each pregnancy. The dose should be obtained regardless of the length of time since the last dose. Immunization is preferred during the 27th-36th week of gestation. An adult who has not previously received Tdap or who does not know her vaccine status should receive 1 dose of Tdap. This initial dose should be followed by tetanus and diphtheria toxoids (Td) booster doses every 10 years. Adults with an unknown or incomplete history of completing a 3-dose immunization series with Td-containing vaccines should begin or complete a primary immunization series including a Tdap dose. Adults should receive a Td booster every 10 years.  Varicella vaccine. An adult without evidence of immunity to varicella should receive 2 doses or a second dose if she has previously received 1 dose. Pregnant females who do not have evidence of immunity should receive the first dose after pregnancy. This first dose should be obtained before leaving the health care facility. The second dose should be obtained 4-8 weeks after the first dose.  Human papillomavirus (HPV) vaccine. Females aged 13-26 years who have not received the vaccine previously should obtain the 3-dose series. The vaccine is not recommended for use in pregnant females. However, pregnancy testing is not needed before receiving a dose. If a female is found to be pregnant after receiving a dose, no treatment is needed. In that case, the remaining doses should be  delayed until after the pregnancy. Immunization is recommended for any person with an immunocompromised condition through the age of 65 years if she did not get any or all doses earlier. During the 3-dose series, the second dose should be obtained 4-8 weeks after the first dose. The third dose should be obtained 24 weeks after  the first dose and 16 weeks after the second dose.  Zoster vaccine. One dose is recommended for adults aged 72 years or older unless certain conditions are present.  Measles, mumps, and rubella (MMR) vaccine. Adults born before [birth year] generally are considered immune to measles and mumps. Adults born in [birth year] or later should have 1 or more doses of MMR vaccine unless there is a contraindication to the vaccine or there is laboratory evidence of immunity to each of the three diseases. A routine second dose of MMR vaccine should be obtained at least 28 days after the first dose for students attending postsecondary schools, health care workers, or international travelers. People who received inactivated measles vaccine or an unknown type of measles vaccine during 1963-1967 should receive 2 doses of MMR vaccine. People who received inactivated mumps vaccine or an unknown type of mumps vaccine before 1979 and are at high risk for mumps infection should consider immunization with 2 doses of MMR vaccine. For females of childbearing age, rubella immunity should be determined. If there is no evidence of immunity, females who are not pregnant should be vaccinated. If there is no evidence of immunity, females who are pregnant should delay immunization until after pregnancy. Unvaccinated health care workers born before [birth year] who lack laboratory evidence of measles, mumps, or rubella immunity or laboratory confirmation of disease should consider measles and mumps immunization with 2 doses of MMR vaccine or rubella immunization with 1 dose of MMR vaccine.  Pneumococcal 13-valent conjugate (PCV13) vaccine.  When indicated, a person who is uncertain of her immunization history and has no record of immunization should receive the PCV13 vaccine. An adult aged 36 years or older who has certain medical conditions and has not been previously immunized should receive 1 dose of PCV13 vaccine. This PCV13 should be followed with a dose of pneumococcal polysaccharide (PPSV23) vaccine. The PPSV23 vaccine dose should be obtained at least 8 weeks after the dose of PCV13 vaccine. An adult aged 75 years or older who has certain medical conditions and previously received 1 or more doses of PPSV23 vaccine should receive 1 dose of PCV13. The PCV13 vaccine dose should be obtained 1 or more years after the last PPSV23 vaccine dose.  Pneumococcal polysaccharide (PPSV23) vaccine. When PCV13 is also indicated, PCV13 should be obtained first. All adults aged 70 years and older should be immunized. An adult younger than age 29 years who has certain medical conditions should be immunized. Any person who resides in a nursing home or long-term care facility should be immunized. An adult smoker should be immunized. People with an immunocompromised condition and certain other conditions should receive both PCV13 and PPSV23 vaccines. People with human immunodeficiency virus (HIV) infection should be immunized as soon as possible after diagnosis. Immunization during chemotherapy or radiation therapy should be avoided. Routine use of PPSV23 vaccine is not recommended for American Indians, [place] Natives, or people younger than 65 years unless there are medical conditions that require PPSV23 vaccine. When indicated, people who have unknown immunization and have no record of immunization should receive PPSV23 vaccine. One-time revaccination 5 years after the first dose of PPSV23 is recommended for people aged 19-64 years who have chronic kidney failure, nephrotic syndrome, asplenia, or immunocompromised conditions. People who received 1-2 doses of  PPSV23 before age 34 years should receive another dose of PPSV23 vaccine at age 16 years or later if at least 5 years have passed since the previous dose. Doses of PPSV23 are not needed for people  immunized with PPSV23 at or after age 73 years.  Meningococcal vaccine. Adults with asplenia or persistent complement component deficiencies should receive 2 doses of quadrivalent meningococcal conjugate (MenACWY-D) vaccine. The doses should be obtained at least 2 months apart. Microbiologists working with certain meningococcal bacteria, [place] recruits, people at risk during an outbreak, and people who travel to or live in countries with a high rate of meningitis should be immunized. A first-year college student up through age 64 years who is living in a residence hall should receive a dose if she did not receive a dose on or after her 16th birthday. Adults who have certain high-risk conditions should receive one or more doses of vaccine.  Hepatitis A vaccine. Adults who wish to be protected from this disease, have certain high-risk conditions, work with hepatitis A-infected animals, work in hepatitis A research labs, or travel to or work in countries with a high rate of hepatitis A should be immunized. Adults who were previously unvaccinated and who anticipate close contact with an international adoptee during the first 60 days after arrival in the [place] from a country with a high rate of hepatitis A should be immunized.  Hepatitis B vaccine. Adults who wish to be protected from this disease, have certain high-risk conditions, may be exposed to blood or other infectious body fluids, are household contacts or sex partners of hepatitis B positive people, are clients or workers in certain care facilities, or travel to or work in countries with a high rate of hepatitis B should be immunized.  Haemophilus influenzae type b (Hib) vaccine. A previously unvaccinated person with asplenia or sickle cell disease  or having a scheduled splenectomy should receive 1 dose of Hib vaccine. Regardless of previous immunization, a recipient of a hematopoietic stem cell transplant should receive a 3-dose series 6-12 months after her successful transplant. Hib vaccine is not recommended for adults with HIV infection. Preventive Services / Frequency Ages 82 to 71 years  Blood pressure check.** / Every 1 to 2 years.  Lipid and cholesterol check.** / Every 5 years beginning at age 64.  Clinical breast exam.** / Every 3 years for women in their 78s and 3s.  BRCA-related cancer risk assessment.** / For women who have family members with a BRCA-related cancer (breast, ovarian, tubal, or peritoneal cancers).  Pap test.** / Every 2 years from ages 50 through 63. Every 3 years starting at age 47 through age 47 or 4 with a history of 3 consecutive normal Pap tests.  HPV screening.** / Every 3 years from ages 40 through ages 19 to 57 with a history of 3 consecutive normal Pap tests.  Hepatitis C blood test.** / For any individual with known risks for hepatitis C.  Skin self-exam. / Monthly.  Influenza vaccine. / Every year.  Tetanus, diphtheria, and acellular pertussis (Tdap, Td) vaccine.** / Consult your health care provider. Pregnant women should receive 1 dose of Tdap vaccine during each pregnancy. 1 dose of Td every 10 years.  Varicella vaccine.** / Consult your health care provider. Pregnant females who do not have evidence of immunity should receive the first dose after pregnancy.  HPV vaccine. / 3 doses over 6 months, if 34 and younger. The vaccine is not recommended for use in pregnant females. However, pregnancy testing is not needed before receiving a dose.  Measles, mumps, rubella (MMR) vaccine.** / You need at least 1 dose of MMR if you were born in 1957 or later. You may also need a  2nd dose. For females of childbearing age, rubella immunity should be determined. If there is no evidence of immunity,  females who are not pregnant should be vaccinated. If there is no evidence of immunity, females who are pregnant should delay immunization until after pregnancy.  Pneumococcal 13-valent conjugate (PCV13) vaccine.** / Consult your health care provider.  Pneumococcal polysaccharide (PPSV23) vaccine.** / 1 to 2 doses if you smoke cigarettes or if you have certain conditions.  Meningococcal vaccine.** / 1 dose if you are age 26 to 67 years and a Market researcher living in a residence hall, or have one of several medical conditions, you need to get vaccinated against meningococcal disease. You may also need additional booster doses.  Hepatitis A vaccine.** / Consult your health care provider.  Hepatitis B vaccine.** / Consult your health care provider.  Haemophilus influenzae type b (Hib) vaccine.** / Consult your health care provider. Ages 10 to 36 years  Blood pressure check.** / Every 1 to 2 years.  Lipid and cholesterol check.** / Every 5 years beginning at age 46 years.  Lung cancer screening. / Every year if you are aged 63-80 years and have a 30-pack-year history of smoking and currently smoke or have quit within the past 15 years. Yearly screening is stopped once you have quit smoking for at least 15 years or develop a health problem that would prevent you from having lung cancer treatment.  Clinical breast exam.** / Every year after age 47 years.  BRCA-related cancer risk assessment.** / For women who have family members with a BRCA-related cancer (breast, ovarian, tubal, or peritoneal cancers).  Mammogram.** / Every year beginning at age 66 years and continuing for as long as you are in good health. Consult with your health care provider.  Pap test.** / Every 3 years starting at age 63 years through age 24 or 60 years with a history of 3 consecutive normal Pap tests.  HPV screening.** / Every 3 years from ages 65 years through ages 27 to 64 years with a history of 3  consecutive normal Pap tests.  Fecal occult blood test (FOBT) of stool. / Every year beginning at age 18 years and continuing until age 82 years. You may not need to do this test if you get a colonoscopy every 10 years.  Flexible sigmoidoscopy or colonoscopy.** / Every 5 years for a flexible sigmoidoscopy or every 10 years for a colonoscopy beginning at age 38 years and continuing until age 47 years.  Hepatitis C blood test.** / For all people born from [birth year] through 1963 and any individual with known risks for hepatitis C.  Skin self-exam. / Monthly.  Influenza vaccine. / Every year.  Tetanus, diphtheria, and acellular pertussis (Tdap/Td) vaccine.** / Consult your health care provider. Pregnant women should receive 1 dose of Tdap vaccine during each pregnancy. 1 dose of Td every 10 years.  Varicella vaccine.** / Consult your health care provider. Pregnant females who do not have evidence of immunity should receive the first dose after pregnancy.  Zoster vaccine.** / 1 dose for adults aged 54 years or older.  Measles, mumps, rubella (MMR) vaccine.** / You need at least 1 dose of MMR if you were born in 1957 or later. You may also need a 2nd dose. For females of childbearing age, rubella immunity should be determined. If there is no evidence of immunity, females who are not pregnant should be vaccinated. If there is no evidence of immunity, females who are pregnant should delay  immunization until after pregnancy.  Pneumococcal 13-valent conjugate (PCV13) vaccine.** / Consult your health care provider.  Pneumococcal polysaccharide (PPSV23) vaccine.** / 1 to 2 doses if you smoke cigarettes or if you have certain conditions.  Meningococcal vaccine.** / Consult your health care provider.  Hepatitis A vaccine.** / Consult your health care provider.  Hepatitis B vaccine.** / Consult your health care provider.  Haemophilus influenzae type b (Hib) vaccine.** / Consult your health care  provider. Ages 11 years and over  Blood pressure check.** / Every 1 to 2 years.  Lipid and cholesterol check.** / Every 5 years beginning at age 61 years.  Lung cancer screening. / Every year if you are aged 64-80 years and have a 30-pack-year history of smoking and currently smoke or have quit within the past 15 years. Yearly screening is stopped once you have quit smoking for at least 15 years or develop a health problem that would prevent you from having lung cancer treatment.  Clinical breast exam.** / Every year after age 68 years.  BRCA-related cancer risk assessment.** / For women who have family members with a BRCA-related cancer (breast, ovarian, tubal, or peritoneal cancers).  Mammogram.** / Every year beginning at age 88 years and continuing for as long as you are in good health. Consult with your health care provider.  Pap test.** / Every 3 years starting at age 89 years through age 22 or 27 years with 3 consecutive normal Pap tests. Testing can be stopped between 65 and 70 years with 3 consecutive normal Pap tests and no abnormal Pap or HPV tests in the past 10 years.  HPV screening.** / Every 3 years from ages 40 years through ages 18 or 61 years with a history of 3 consecutive normal Pap tests. Testing can be stopped between 65 and 70 years with 3 consecutive normal Pap tests and no abnormal Pap or HPV tests in the past 10 years.  Fecal occult blood test (FOBT) of stool. / Every year beginning at age 49 years and continuing until age 62 years. You may not need to do this test if you get a colonoscopy every 10 years.  Flexible sigmoidoscopy or colonoscopy.** / Every 5 years for a flexible sigmoidoscopy or every 10 years for a colonoscopy beginning at age 49 years and continuing until age 25 years.  Hepatitis C blood test.** / For all people born from [birth year] through 1963 and any individual with known risks for hepatitis C.  Osteoporosis screening.** / A one-time screening for  women ages 21 years and over and women at risk for fractures or osteoporosis.  Skin self-exam. / Monthly.  Influenza vaccine. / Every year.  Tetanus, diphtheria, and acellular pertussis (Tdap/Td) vaccine.** / 1 dose of Td every 10 years.  Varicella vaccine.** / Consult your health care provider.  Zoster vaccine.** / 1 dose for adults aged 46 years or older.  Pneumococcal 13-valent conjugate (PCV13) vaccine.** / Consult your health care provider.  Pneumococcal polysaccharide (PPSV23) vaccine.** / 1 dose for all adults aged 69 years and older.  Meningococcal vaccine.** / Consult your health care provider.  Hepatitis A vaccine.** / Consult your health care provider.  Hepatitis B vaccine.** / Consult your health care provider.  Haemophilus influenzae type b (Hib) vaccine.** / Consult your health care provider. ** Family history and personal history of risk and conditions may change your health care provider's recommendations. Document Released: 05/26/2001 Document Revised: 08/14/2013 Document Reviewed: 08/25/2010 [facility] Patient Information 2015 [place], [place]. This information is  not intended to replace advice given to you by your health care provider. Make sure you discuss any questions you have with your health care provider.

## 2014-12-28 NOTE — Progress Notes (Signed)
Pre visit review using our clinic review tool, if applicable. No additional management support is needed unless otherwise documented below in the visit note/SLS  

## 2014-12-29 LAB — HIV ANTIBODY (ROUTINE TESTING W REFLEX): HIV: NONREACTIVE

## 2015-01-01 ENCOUNTER — Encounter: Payer: Self-pay | Admitting: *Deleted

## 2015-01-11 DIAGNOSIS — J208 Acute bronchitis due to other specified organisms: Secondary | ICD-10-CM

## 2015-01-11 DIAGNOSIS — B9689 Other specified bacterial agents as the cause of diseases classified elsewhere: Secondary | ICD-10-CM | POA: Insufficient documentation

## 2015-01-11 NOTE — Assessment & Plan Note (Signed)
Stable  Continue current regimen  

## 2015-01-11 NOTE — Assessment & Plan Note (Signed)
Depression screen negative. Health Maintenance reviewed -- up-to-date. Preventive schedule discussed and handout given in AVS. Will obtain fasting labs today.  

## 2015-01-11 NOTE — Assessment & Plan Note (Signed)
Rx Azithromycin. Increase fluids. Continue albuterol. Supportive measures and OTC medications reviewed. Follow-up if symptoms are not resolving.

## 2015-01-11 NOTE — Assessment & Plan Note (Signed)
Stable.  Continue current medication

## 2015-03-05 ENCOUNTER — Other Ambulatory Visit: Payer: Self-pay

## 2015-03-05 DIAGNOSIS — Z1231 Encounter for screening mammogram for malignant neoplasm of breast: Secondary | ICD-10-CM

## 2015-04-10 ENCOUNTER — Ambulatory Visit: Payer: Federal, State, Local not specified - PPO

## 2015-06-21 ENCOUNTER — Ambulatory Visit: Payer: Federal, State, Local not specified - PPO

## 2015-07-12 ENCOUNTER — Ambulatory Visit
Admission: RE | Admit: 2015-07-12 | Discharge: 2015-07-12 | Disposition: A | Discharge status: Home / Self Care | Payer: Federal, State, Local not specified - PPO | Source: Ambulatory Visit

## 2015-07-12 DIAGNOSIS — Z1231 Encounter for screening mammogram for malignant neoplasm of breast: Secondary | ICD-10-CM

## 2015-09-30 ENCOUNTER — Other Ambulatory Visit: Payer: Self-pay | Admitting: Physician Assistant

## 2015-10-23 ENCOUNTER — Telehealth: Payer: Self-pay | Admitting: *Deleted

## 2015-10-23 NOTE — Telephone Encounter (Signed)
PA initiated on [email or web address] Key: [id number]  Awaiting determination

## 2015-10-23 NOTE — Telephone Encounter (Signed)
PA approved for generic Nexium effective 08/24/15 - 10/22/16.  [id number]

## 2015-12-22 ENCOUNTER — Encounter (HOSPITAL_COMMUNITY): Payer: Self-pay | Admitting: Obstetrics and Gynecology

## 2015-12-23 DIAGNOSIS — N814 Uterovaginal prolapse, unspecified: Secondary | ICD-10-CM | POA: Insufficient documentation

## 2015-12-23 DIAGNOSIS — Z90710 Acquired absence of both cervix and uterus: Secondary | ICD-10-CM | POA: Insufficient documentation

## 2015-12-25 DIAGNOSIS — Z803 Family history of malignant neoplasm of breast: Secondary | ICD-10-CM | POA: Insufficient documentation

## 2016-01-03 ENCOUNTER — Encounter: Payer: Federal, State, Local not specified - PPO | Admitting: Physician Assistant

## 2016-01-09 ENCOUNTER — Encounter: Payer: Self-pay | Admitting: Medical

## 2016-01-09 ENCOUNTER — Ambulatory Visit (HOSPITAL_BASED_OUTPATIENT_CLINIC_OR_DEPARTMENT_OTHER)
Admission: RE | Admit: 2016-01-09 | Discharge: 2016-01-09 | Disposition: A | Discharge status: Home / Self Care | Payer: Federal, State, Local not specified - PPO | Source: Ambulatory Visit | Attending: Medical | Admitting: Medical

## 2016-01-09 ENCOUNTER — Ambulatory Visit (INDEPENDENT_AMBULATORY_CARE_PROVIDER_SITE_OTHER): Payer: Federal, State, Local not specified - PPO | Admitting: Medical

## 2016-01-09 VITALS — BP 150/90 | HR 83 | Temp 98.2°F | Ht 67.0 in | Wt 237.6 lb

## 2016-01-09 DIAGNOSIS — R062 Wheezing: Secondary | ICD-10-CM

## 2016-01-09 DIAGNOSIS — R319 Hematuria, unspecified: Secondary | ICD-10-CM

## 2016-01-09 DIAGNOSIS — R509 Fever, unspecified: Secondary | ICD-10-CM | POA: Diagnosis not present

## 2016-01-09 DIAGNOSIS — M549 Dorsalgia, unspecified: Secondary | ICD-10-CM

## 2016-01-09 DIAGNOSIS — J209 Acute bronchitis, unspecified: Secondary | ICD-10-CM

## 2016-01-09 DIAGNOSIS — R350 Frequency of micturition: Secondary | ICD-10-CM | POA: Diagnosis not present

## 2016-01-09 DIAGNOSIS — R05 Cough: Secondary | ICD-10-CM | POA: Diagnosis present

## 2016-01-09 DIAGNOSIS — R6883 Chills (without fever): Secondary | ICD-10-CM

## 2016-01-09 DIAGNOSIS — J029 Acute pharyngitis, unspecified: Secondary | ICD-10-CM | POA: Diagnosis not present

## 2016-01-09 LAB — CBC WITH DIFFERENTIAL/PLATELET
BASOS PCT: 0.3 % (ref 0.0–3.0)
Basophils Absolute: 0 10*3/uL (ref 0.0–0.1)
EOS PCT: 6.4 % — AB (ref 0.0–5.0)
Eosinophils Absolute: 0.3 10*3/uL (ref 0.0–0.7)
HEMATOCRIT: 40 % (ref 36.0–46.0)
HEMOGLOBIN: 13.2 g/dL (ref 12.0–15.0)
Lymphocytes Relative: 47.4 % — ABNORMAL HIGH (ref 12.0–46.0)
Lymphs Abs: 2.3 10*3/uL (ref 0.7–4.0)
MCHC: 33.1 g/dL (ref 30.0–36.0)
MCV: 77.1 fl — AB (ref 78.0–100.0)
MONO ABS: 0.5 10*3/uL (ref 0.1–1.0)
MONOS PCT: 9.5 % (ref 3.0–12.0)
Neutro Abs: 1.8 10*3/uL (ref 1.4–7.7)
Neutrophils Relative %: 36.4 % — ABNORMAL LOW (ref 43.0–77.0)
Platelets: 239 10*3/uL (ref 150.0–400.0)
RBC: 5.18 Mil/uL — AB (ref 3.87–5.11)
RDW: 15 % (ref 11.5–15.5)
WBC: 4.9 10*3/uL (ref 4.0–10.5)

## 2016-01-09 LAB — POCT URINALYSIS DIPSTICK
Bilirubin, UA: NEGATIVE
GLUCOSE UA: NEGATIVE
Ketones, UA: NEGATIVE
LEUKOCYTES UA: NEGATIVE
Nitrite, UA: NEGATIVE
PROTEIN UA: NEGATIVE
SPEC GRAV UA: 1.025
UROBILINOGEN UA: 4
pH, UA: 6

## 2016-01-09 LAB — COMPREHENSIVE METABOLIC PANEL
ALK PHOS: 61 U/L (ref 39–117)
ALT: 20 U/L (ref 0–35)
AST: 20 U/L (ref 0–37)
Albumin: 3.8 g/dL (ref 3.5–5.2)
BILIRUBIN TOTAL: 0.3 mg/dL (ref 0.2–1.2)
BUN: 6 mg/dL (ref 6–23)
CO2: 29 mEq/L (ref 19–32)
Calcium: 9.1 mg/dL (ref 8.4–10.5)
Chloride: 105 mEq/L (ref 96–112)
Creatinine, Ser: 0.72 mg/dL (ref 0.40–1.20)
GFR: 108.74 mL/min (ref >60.00)
GLUCOSE: 96 mg/dL (ref 70–99)
POTASSIUM: 4.1 meq/L (ref 3.5–5.1)
Sodium: 139 mEq/L (ref 135–145)
TOTAL PROTEIN: 7.9 g/dL (ref 6.0–8.3)

## 2016-01-09 LAB — POCT RAPID STREP A (OFFICE): Rapid Strep A Screen: NEGATIVE

## 2016-01-09 MED ORDER — FLUTICASONE PROPIONATE HFA 110 MCG/ACT IN AERO: 2.0000 | INHALATION_SPRAY | Freq: Two times a day (BID) | RESPIRATORY_TRACT | 2 refills | Status: DC

## 2016-01-09 MED ORDER — HYDROCODONE-HOMATROPINE 5-1.5 MG/5ML PO SYRP: 5.0000 mL | ORAL_SOLUTION | Freq: Three times a day (TID) | ORAL | 0 refills | Status: DC | PRN

## 2016-01-09 MED ORDER — ALBUTEROL SULFATE HFA 108 (90 BASE) MCG/ACT IN AERS: 2.0000 | INHALATION_SPRAY | Freq: Four times a day (QID) | RESPIRATORY_TRACT | 0 refills | Status: DC | PRN

## 2016-01-09 MED ORDER — LEVOFLOXACIN 500 MG PO TABS: 500.0000 mg | ORAL_TABLET | Freq: Every day | ORAL | 0 refills | Status: DC

## 2016-01-09 NOTE — Patient Instructions (Addendum)
Your strep test was negative.(levofloxin antibiotic should be adequate in event strep test falsely negative)   For your frequent urination, blood in urine and left side pain over kidney will get urine culture and prescribe levofloxin antibiotic.   Will get chest xray today. Antibiotic levofloxin could treat pneumonia as well as uti or kidney infection.  For wheezing rx flovent and rx albuterol inhaler.  For cough rx hycodan.   For back pain can use ibuprofen. The  Hydrocodone in cough syryp may help with you back pain as well.  If you have worse back pain that runs toward you flank let me know could get ct abd/pelvis to evaluate for kidney stone.  Get cbc for chills.  Follow up in 7 days or as needed

## 2016-01-09 NOTE — Progress Notes (Signed)
Subjective:    Patient ID: Natalie Vazquez, female    DOB: [birth year], 55 y.o.   MRN: [id number]  HPI   Pt in with sore throat for about 3 days. Moderate pain. Rapid strep test was negative today.  Pt has been coughing since Saturday. At first was dry cough. Yesterday did bring up some mucous and was dark yellow. Pt has some chills but no sweats. Pt has been wheezing some. Pt not a smoker. Pt has history of asthma. Pt has not been using her inhaler. Does not have an inhaler. Wheezing very minimal/mild recently.  Pt states some low back pain since yesterday. Pain is worse with movement. Pt has history of low back pain before. Pt states long time since her back pain flared. Estimates 2 years since back pain flare. No dysuria. Mild increased frequency of urination. Back pain keeping her up at night.   Pt recently  has used zyrtec.       Review of Systems  Constitutional: Positive for chills. Negative for diaphoresis and fever.  HENT: Positive for congestion, ear pain, sinus pressure and sore throat. Negative for trouble swallowing.        Ear pressure.  Respiratory: Positive for choking and wheezing. Negative for chest tightness and shortness of breath.        Mild.  Cardiovascular: Negative for chest pain and palpitations.  Gastrointestinal: Negative for abdominal pain.  Genitourinary: Positive for frequency. Negative for difficulty urinating, dysuria, urgency and vaginal pain.  Musculoskeletal: Positive for back pain.       Lower back. No pain radiating to legs. No numbness. No leg weakness.  But on exam left cva pain.  Skin: Negative for rash.  Psychiatric/Behavioral: Negative for behavioral problems and confusion.    Past Medical History:  Diagnosis Date  . Allergic rhinitis due to other allergen   . Allergy    seasonal  . Anemia   . Arthritis   . Asthma    mild, intermittent  . Chicken pox   . Cholelithiasis   . CTS (carpal tunnel syndrome)   . Fibroids   .  Fibromyalgia   . Frequent urination   . GERD (gastroesophageal reflux disease)   . Headache(784.0)   . Migraine   . PONV (postoperative nausea and vomiting)    difficulty waking up  . Problems related to high-risk sexual behavior    Unprotected sex  . Symptomatic menopausal or female climacteric states      Social History   Social History  . Marital status: Single    Spouse name: N/A  . Number of children: N/A  . Years of education: N/A   Occupational History  . Not on file.   Social History Main Topics  . Smoking status: Never Smoker  . Smokeless tobacco: Never Used  . Alcohol use Yes     Comment: rarely   . Drug use: No  . Sexual activity: No   Other Topics Concern  . Not on file   Social History Narrative  . No narrative on file    Past Surgical History:  Procedure Laterality Date  . ABDOMINAL HYSTERECTOMY  2008   partial, ovaries remain  . CARPAL TUNNEL RELEASE Right 2015  . CHOLECYSTECTOMY  05/19/2011   Procedure: LAPAROSCOPIC CHOLECYSTECTOMY;  Surgeon: Zenovia Jarred, MD;  Location: [facility];  Service: General;  Laterality: N/A;  . LASER ABLATION Media / VULVAR    . PERCUTANEOUS PINNING  01/22/2012   Procedure: PERCUTANEOUS PINNING  EXTREMITY;  Surgeon: Marybelle Killings, MD;  Location: [facility];  Service: Orthopedics;  Laterality: Right;  Closed Reduction and Pinning right 5th Metacarpal Fracture  . TUBAL LIGATION    . WISDOM TOOTH EXTRACTION      Family History  Problem Relation Age of Onset  . Diabetes Mother     Deceased  . Hypertension Mother   . Anemia Mother   . Hyperlipidemia Mother   . Arthritis/Rheumatoid Mother   . Migraines Mother   . CVA Mother   . Hypertension Father     Deceased  . Stroke Father   . Diabetes Father     Legs Amputated  . Heart disease Father   . Arthritis Father   . Cancer Father   . Heart attack Maternal Aunt   . Diabetes Maternal Uncle   . Migraines Maternal Aunt   . Asthma Maternal Aunt   . Asthma  Maternal Uncle   . Stomach cancer Sister   . Breast cancer Sister   . Uterine cancer Sister     or Ovarian  . Sleep apnea Brother   . COPD Brother   . Asthma Sister     x2  . Sudden death Brother     SIDS  . Hypertension Brother   . Diabetes Sister   . Asthma Daughter     x2  . Allergies Daughter   . Sickle cell trait Other     All Children  . Atrial fibrillation Daughter   . Anxiety disorder Daughter   . Allergies Son   . Hypertension Sister   . Heart disease Sister   . Asthma Brother     Allergies  Allergen Reactions  . Iodine Anaphylaxis  . Shellfish Allergy Anaphylaxis  . Latex Rash    Current Outpatient Prescriptions on File Prior to Visit  Medication Sig Dispense Refill  . Biotin 1000 MCG tablet Take 1,000 mcg by mouth daily.    . cetirizine (ZYRTEC) 10 MG tablet TAKE 1/2 TABLET BY MOUTH EVERY DAY 30 tablet 5  . cyclobenzaprine (FLEXERIL) 10 MG tablet TAKE 1 TABLET BY MOUTH THREE TIMES DAILY AS NEEDED FOR PAIN 30 tablet 0  . esomeprazole (NEXIUM) 40 MG capsule TAKE 1 CAPSULE BY MOUTH EVERY MORNING BEFORE BEAKFEST 90 capsule 1  . Multiple Vitamins-Minerals (MULTIVITAMIN WITH MINERALS) tablet Take 1 tablet by mouth daily. Daily MVI w/Iron; Alternate w/Prenatal MVI    . NON FORMULARY Take 3 tablets by mouth as needed. Swiss Kriss Herbal Laxative Tab    . SUMAtriptan (IMITREX) 25 MG tablet Take 1 tablet (25 mg total) by mouth once. May repeat in 2 hours if headache persists or recurs. 10 tablet 0  . vitamin E 400 UNIT capsule Take 400 Units by mouth daily.    Marland Kitchen zonisamide (ZONEGRAN) 25 MG capsule Take 1 capsule (25 mg total) by mouth daily as needed. 30 capsule 3  . [DISCONTINUED] phentermine 37.5 MG capsule Take 37.5 mg by mouth every morning.     No current facility-administered medications on file prior to visit.     BP (!) 150/90   Pulse 83   Temp 98.2 F (36.8 C) (Oral)   Ht 5\' 7"  (1.702 m)   Wt 237 lb 9.6 oz (107.8 kg)   SpO2 99%   BMI 37.21 kg/m       Objective:   Physical Exam   General  Mental Status - Alert. General Appearance - Well groomed. Not in acute distress.  Skin Rashes- No Rashes.  No vesicle in cva area.  HEENT Head- Normal. Ear Auditory Canal - Left- Normal. Right - Normal.Tympanic Membrane- Left- Normal. Right- Normal. Eye Sclera/Conjunctiva- Left- Normal. Right- Normal. Nose & Sinuses Nasal Mucosa- Left-  Boggy and Congested. Right-  Boggy and  Congested.Bilateral maxillary and frontal sinus pressure. Mouth & Throat Lips: Upper Lip- Normal: no dryness, cracking, pallor, cyanosis, or vesicular eruption. Lower Lip-Normal: no dryness, cracking, pallor, cyanosis or vesicular eruption. Buccal Mucosa- Bilateral- No Aphthous ulcers. Oropharynx- No Discharge or Erythema. Tonsils: Characteristics- Bilateral- No Erythema or Congestion. Size/Enlargement- Bilateral- No enlargement. Discharge- bilateral-None.  Neck Neck- Supple. No Masses.   Chest and Lung Exam Auscultation: Breath Sounds:-Clear even and unlabored.  Cardiovascular Auscultation:Rythm- Regular, rate and rhythm. Murmurs & Other Heart Sounds:Ausculatation of the heart reveal- No Murmurs.  Lymphatic Head & Neck General Head & Neck Lymphatics: Bilateral: Description- No Localized lymphadenopathy.   abd- soft, nt, nd, +bs, no rebound or guarding. No organomegaly.  Back No Mid lumbar spine tenderness to palpation. Pain on straight leg lift. Pain on lateral movements and flexion/extension of the spine.  Lt cva tenderness.  Lower ext neurologic  L5-S1 sensation intact bilaterally. Normal patellar reflexes bilaterally. No foot drop bilaterally.     Assessment & Plan:  Your strep test was negative. (levofloxin antibiotic should be adequate in event strep test falsely negative)   For your frequent urination, blood in urine and left side pain over kidney will get urine culture and prescribe levofloxin antibiotic.   Will get chest xray today.  Antibiotic levofloxin could treat pneumonia as well as uti or kidney infection.  For wheezing rx flovent and rx albuterol inhaler.  For cough rx hycodan.   For back pain can use ibuprofen. The  Hydrocodone in cough syryp may help with you back pain as well.  If you have worse back pain that runs toward you flank let me know could get ct abd/pelvis to evaluate for kidney stone.  Get cbc for chills.  Follow up in 7 days or as needed  Camile Esters, Percell Miller, [employer]

## 2016-01-09 NOTE — Progress Notes (Signed)
Pre visit review using our clinic tool,if applicable. No additional management support is needed unless otherwise documented below in the visit note.  

## 2016-01-10 ENCOUNTER — Ambulatory Visit (HOSPITAL_BASED_OUTPATIENT_CLINIC_OR_DEPARTMENT_OTHER)
Admission: RE | Admit: 2016-01-10 | Discharge: 2016-01-10 | Disposition: A | Discharge status: Home / Self Care | Payer: Federal, State, Local not specified - PPO | Source: Ambulatory Visit | Attending: Medical | Admitting: Medical

## 2016-01-10 ENCOUNTER — Telehealth: Payer: Self-pay | Admitting: Medical

## 2016-01-10 ENCOUNTER — Encounter (HOSPITAL_BASED_OUTPATIENT_CLINIC_OR_DEPARTMENT_OTHER): Payer: Self-pay | Admitting: *Deleted

## 2016-01-10 ENCOUNTER — Emergency Department (HOSPITAL_BASED_OUTPATIENT_CLINIC_OR_DEPARTMENT_OTHER)
Admission: EM | Admit: 2016-01-10 | Discharge: 2016-01-10 | Disposition: A | Discharge status: Home / Self Care | Payer: Federal, State, Local not specified - PPO | Attending: Emergency Medicine | Admitting: Emergency Medicine

## 2016-01-10 DIAGNOSIS — M545 Low back pain: Secondary | ICD-10-CM

## 2016-01-10 DIAGNOSIS — S39012A Strain of muscle, fascia and tendon of lower back, initial encounter: Secondary | ICD-10-CM | POA: Insufficient documentation

## 2016-01-10 DIAGNOSIS — Y999 Unspecified external cause status: Secondary | ICD-10-CM | POA: Insufficient documentation

## 2016-01-10 DIAGNOSIS — R319 Hematuria, unspecified: Secondary | ICD-10-CM

## 2016-01-10 DIAGNOSIS — Y929 Unspecified place or not applicable: Secondary | ICD-10-CM | POA: Diagnosis not present

## 2016-01-10 DIAGNOSIS — R350 Frequency of micturition: Secondary | ICD-10-CM | POA: Diagnosis not present

## 2016-01-10 DIAGNOSIS — J069 Acute upper respiratory infection, unspecified: Secondary | ICD-10-CM | POA: Insufficient documentation

## 2016-01-10 DIAGNOSIS — J45909 Unspecified asthma, uncomplicated: Secondary | ICD-10-CM | POA: Insufficient documentation

## 2016-01-10 DIAGNOSIS — R109 Unspecified abdominal pain: Secondary | ICD-10-CM

## 2016-01-10 DIAGNOSIS — Z7951 Long term (current) use of inhaled steroids: Secondary | ICD-10-CM | POA: Diagnosis not present

## 2016-01-10 DIAGNOSIS — Y939 Activity, unspecified: Secondary | ICD-10-CM | POA: Diagnosis not present

## 2016-01-10 DIAGNOSIS — T148XXA Other injury of unspecified body region, initial encounter: Secondary | ICD-10-CM

## 2016-01-10 DIAGNOSIS — R51 Headache: Secondary | ICD-10-CM

## 2016-01-10 DIAGNOSIS — X58XXXA Exposure to other specified factors, initial encounter: Secondary | ICD-10-CM | POA: Diagnosis not present

## 2016-01-10 DIAGNOSIS — R519 Headache, unspecified: Secondary | ICD-10-CM

## 2016-01-10 MED ORDER — CYCLOBENZAPRINE HCL 10 MG PO TABS: 10.0000 mg | ORAL_TABLET | Freq: Two times a day (BID) | ORAL | 0 refills | Status: DC | PRN

## 2016-01-10 MED ORDER — BENZONATATE 100 MG PO CAPS: 100.0000 mg | ORAL_CAPSULE | Freq: Three times a day (TID) | ORAL | 0 refills | Status: DC | PRN

## 2016-01-10 NOTE — Telephone Encounter (Signed)
I talked with pt her abdomen pain and back pain 4/10. But she sounds to be in worse pain. Reviewed ct scan with 2nd radiologist. Read from first confirmed. When I called pt back she stated crick in her head which was new. With unccontrolled abd pain and new complaint HA I advised ED evaluation. I called ED and tried to talk with MD then PA. Both busy. So informed charge nurse of pt presentation.

## 2016-01-10 NOTE — ED Provider Notes (Signed)
[facility] Provider Note   CSN: [id number] Arrival date & time: 01/10/16  1750 By signing my name below, I, Dyke Brackett, attest that this documentation has been prepared under the direction and in the presence of No att. providers found . Electronically Signed: Dyke Brackett, Scribe. 01/11/2016. 1:45 PM.   History   Chief Complaint Chief Complaint  Patient presents with  . Back Pain    HPI Natalie Vazquez is a 55 y.o. female with hx of asthma, fibromyalgia, migraines, and bronchitis who presents to the Emergency Department complaining of worsening bilateral lower back pain onset one weeks ago. She describes the pain as aching and cramping. Pt rates her pain as 4/10 in severity.   Pt also complains of gradual onset, worsening left sided headache which began yesterday. She describes the pain as pressure and states it feels like she has "a crick in her head". Pt rates this headache as a 4/10 in severity.   She notes associated associated chills, generalized body aches, and increased urinary frequency. She has taken ibuprofen and codeine cough syrup with no relief of symptoms.  She was seen by her PCP yesterday and had normal ct renal stone study, chest x ray, and strep test.Pt states her daughter and granddaughter have the same symptoms. She denies fall, LOC,  nausea, vomiting, numbness, weakness, abdominal pain, vaginal discharge, or vaginal bleeding.   The history is provided by the patient. No language interpreter was used.    Past Medical History:  Diagnosis Date  . Allergic rhinitis due to other allergen   . Allergy    seasonal  . Anemia   . Arthritis   . Asthma    mild, intermittent  . Chicken pox   . Cholelithiasis   . CTS (carpal tunnel syndrome)   . Fibroids   . Fibromyalgia   . Frequent urination   . GERD (gastroesophageal reflux disease)   . Headache(784.0)   . Migraine   . PONV (postoperative nausea and vomiting)    difficulty waking up  .  Problems related to high-risk sexual behavior    Unprotected sex  . Symptomatic menopausal or female climacteric states     Patient Active Problem List   Diagnosis Date Noted  . Acute bacterial bronchitis 01/11/2015  . Migraines 09/28/2013  . Visit for preventive health examination 09/10/2013  . Fatigue 09/10/2013  . GERD (gastroesophageal reflux disease) 03/19/2013  . Seasonal allergies 03/19/2013  . Neck pain, chronic 03/19/2013  . Carpal tunnel syndrome 03/19/2013  . IBS (irritable bowel syndrome) 03/19/2013  . Fracture, metacarpal shaft 01/22/2012    Past Surgical History:  Procedure Laterality Date  . ABDOMINAL HYSTERECTOMY  2008   partial, ovaries remain  . CARPAL TUNNEL RELEASE Right 2015  . CHOLECYSTECTOMY  05/19/2011   Procedure: LAPAROSCOPIC CHOLECYSTECTOMY;  Surgeon: Zenovia Jarred, MD;  Location: [facility];  Service: General;  Laterality: N/A;  . LASER ABLATION [place] / VULVAR    . PERCUTANEOUS PINNING  01/22/2012   Procedure: PERCUTANEOUS PINNING EXTREMITY;  Surgeon: Marybelle Killings, MD;  Location: [facility];  Service: Orthopedics;  Laterality: Right;  Closed Reduction and Pinning right 5th Metacarpal Fracture  . TUBAL LIGATION    . WISDOM TOOTH EXTRACTION      OB History    No data available       Home Medications    Prior to Admission medications   Medication Sig Start Date End Date Taking? Authorizing Provider  albuterol (PROVENTIL HFA;VENTOLIN HFA) 108 (  90 Base) MCG/ACT inhaler Inhale 2 puffs into the lungs every 6 (six) hours as needed for wheezing or shortness of breath. 01/09/16   Percell Miller Saguier, PA-C  benzonatate (TESSALON) 100 MG capsule Take 1 capsule (100 mg total) by mouth 3 (three) times daily as needed for cough. 01/10/16   Gareth Morgan, MD  Biotin 1000 MCG tablet Take 1,000 mcg by mouth daily.    Historical Provider, MD  cetirizine (ZYRTEC) 10 MG tablet TAKE 1/2 TABLET BY MOUTH EVERY DAY 09/30/15   Brunetta Jeans, PA-C  cyclobenzaprine  (FLEXERIL) 10 MG tablet Take 1 tablet (10 mg total) by mouth 2 (two) times daily as needed for muscle spasms. 01/10/16   Gareth Morgan, MD  esomeprazole (NEXIUM) 40 MG capsule TAKE 1 CAPSULE BY MOUTH EVERY MORNING BEFORE BEAKFEST 12/28/14   Brunetta Jeans, PA-C  fluticasone (FLOVENT HFA) 110 MCG/ACT inhaler Inhale 2 puffs into the lungs 2 (two) times daily at 10 AM and 5 PM. 01/09/16   Mackie Pai, PA-C  HYDROcodone-homatropine [facility]) 5-1.5 MG/5ML syrup Take 5 mLs by mouth every 8 (eight) hours as needed for cough. 01/09/16   Percell Miller Saguier, PA-C  levofloxacin (LEVAQUIN) 500 MG tablet Take 1 tablet (500 mg total) by mouth daily. 01/09/16   Percell Miller Saguier, PA-C  Multiple Vitamins-Minerals (MULTIVITAMIN WITH MINERALS) tablet Take 1 tablet by mouth daily. Daily MVI w/Iron; Alternate w/Prenatal MVI    Historical Provider, MD  NON FORMULARY Take 3 tablets by mouth as needed. Swiss Kriss Herbal Laxative Tab    Historical Provider, MD  SUMAtriptan (IMITREX) 25 MG tablet Take 1 tablet (25 mg total) by mouth once. May repeat in 2 hours if headache persists or recurs. 12/28/14   Brunetta Jeans, PA-C  vitamin E 400 UNIT capsule Take 400 Units by mouth daily.    Historical Provider, MD  zonisamide (ZONEGRAN) 25 MG capsule Take 1 capsule (25 mg total) by mouth daily as needed. 12/28/14   Brunetta Jeans, PA-C    Family History Family History  Problem Relation Age of Onset  . Diabetes Mother     Deceased  . Hypertension Mother   . Anemia Mother   . Hyperlipidemia Mother   . Arthritis/Rheumatoid Mother   . Migraines Mother   . CVA Mother   . Hypertension Father     Deceased  . Stroke Father   . Diabetes Father     Legs Amputated  . Heart disease Father   . Arthritis Father   . Cancer Father   . Heart attack Maternal Aunt   . Diabetes Maternal Uncle   . Migraines Maternal Aunt   . Asthma Maternal Aunt   . Asthma Maternal Uncle   . Stomach cancer Sister   . Breast cancer Sister   . Uterine  cancer Sister     or Ovarian  . Sleep apnea Brother   . COPD Brother   . Asthma Sister     x2  . Sudden death Brother     SIDS  . Hypertension Brother   . Diabetes Sister   . Asthma Daughter     x2  . Allergies Daughter   . Sickle cell trait Other     All Children  . Atrial fibrillation Daughter   . Anxiety disorder Daughter   . Allergies Son   . Hypertension Sister   . Heart disease Sister   . Asthma Brother    Social History Social History  Substance Use Topics  . Smoking status:  Never Smoker  . Smokeless tobacco: Never Used  . Alcohol use Yes     Comment: rarely    Allergies   Iodine; Shellfish allergy; and Latex  Review of Systems Review of Systems  Constitutional: Positive for chills and fatigue. Negative for fever.  HENT: Negative for sore throat.   Eyes: Negative for visual disturbance.  Respiratory: Positive for cough. Negative for shortness of breath.   Cardiovascular: Negative for chest pain.  Gastrointestinal: Negative for abdominal pain, nausea and vomiting.  Genitourinary: Positive for frequency. Negative for difficulty urinating, vaginal bleeding and vaginal discharge.  Musculoskeletal: Positive for back pain and myalgias. Negative for neck pain.  Skin: Negative for rash.  Neurological: Positive for headaches. Negative for syncope, facial asymmetry, weakness and numbness.  Hematological: Does not bruise/bleed easily.  All other systems reviewed and are negative.  Physical Exam Updated Vital Signs BP 129/94   Pulse 71   Temp 97.8 F (36.6 C) (Oral)   Resp 16   Ht 5\' 7"  (1.702 m)   Wt 237 lb (107.5 kg)   SpO2 100%   BMI 37.12 kg/m   Physical Exam  Constitutional: She is oriented to person, place, and time. She appears well-developed and well-nourished. No distress.  HENT:  Head: Normocephalic and atraumatic.  Eyes: Conjunctivae and EOM are normal.  Neck: Normal range of motion.  Cardiovascular: Normal rate, regular rhythm, normal heart  sounds and intact distal pulses.  Exam reveals no gallop and no friction rub.   No murmur heard. Pulmonary/Chest: Effort normal and breath sounds normal. No respiratory distress. She has no wheezes. She has no rales.  Abdominal: Soft. She exhibits no distension. There is no tenderness. There is no guarding.  Musculoskeletal: She exhibits tenderness. She exhibits no edema.  Left lower back tenderness over paraspinal muscles; no pain in the middle  Neurological: She is alert and oriented to person, place, and time. She has normal strength. No cranial nerve deficit or sensory deficit. Coordination normal. GCS eye subscore is 4. GCS verbal subscore is 5. GCS motor subscore is 6.  Skin: Skin is warm and dry. No rash noted. She is not diaphoretic. No erythema.  Psychiatric: She has a normal mood and affect.  Nursing note and vitals reviewed.  ED Treatments / Results  DIAGNOSTIC STUDIES:  Oxygen Saturation is 100% on RA, normal by my interpretation.    COORDINATION OF CARE:  7:32 PM Discussed treatment plan with pt at bedside and pt agreed to plan.   Labs (all labs ordered are listed, but only abnormal results are displayed) Labs Reviewed - No data to display  EKG  EKG Interpretation None      Radiology Dg Chest 2 View  Result Date: 01/09/2016 CLINICAL DATA:  Cough. EXAM: CHEST  2 VIEW COMPARISON:  Radiographs of January 21, 2012. FINDINGS: The heart size and mediastinal contours are within normal limits. Both lungs are clear. No pneumothorax or pleural effusion is noted. The visualized skeletal structures are unremarkable. IMPRESSION: No active cardiopulmonary disease. Electronically Signed   By: Marijo Conception, M.D.   On: 01/09/2016 15:42   Ct Renal Stone Study  Result Date: 01/10/2016 CLINICAL DATA:  Left flank pain radiating to left lower quadrant for 4 days. Hematuria. EXAM: CT ABDOMEN AND PELVIS WITHOUT CONTRAST TECHNIQUE: Multidetector CT imaging of the abdomen and pelvis was  performed following the standard protocol without IV contrast. COMPARISON:  08/30/2008 FINDINGS: Lower chest: Clear lung bases. Normal heart size without pericardial or pleural effusion. Hepatobiliary:  Normal liver. Cholecystectomy, without biliary ductal dilatation. Pancreas: Normal, without mass or ductal dilatation. Spleen: Normal in size, without focal abnormality. Adrenals/Urinary Tract: Normal adrenal glands. No hydroureter or ureteric calculi. No bladder calculi. Stomach/Bowel: Normal stomach, without wall thickening. Normal colon, appendix, and terminal ileum. Normal small bowel. Vascular/Lymphatic: Normal caliber of the aorta and branch vessels. No abdominopelvic adenopathy. Reproductive: Hysterectomy.  No adnexal mass. Other: No significant free fluid. Musculoskeletal: No acute osseous abnormality. IMPRESSION: 1.  No urinary tract calculi or hydronephrosis. 2. No other explanation for left-sided pain. Electronically Signed   By: Abigail Miyamoto M.D.   On: 01/10/2016 17:20    Procedures Procedures (including critical care time)  Medications Ordered in ED Medications - No data to display   Initial Impression / Assessment and Plan / ED Course  I have reviewed the triage vital signs and the nursing notes.  Pertinent labs & imaging results that were available during my care of the patient were reviewed by me and considered in my medical decision making (see chart for details).  Clinical Course   55yo female with history of allergies, fibromyalgia presents with concern of cough, congestion, back pain, headache.  Headache began slowly, no trauma, no fevers, and normal neurologic exam and have low suspicion for [facility], SDH or meningitis. Recommend tylenol/ibuprofen for headache control. Patient has a normal neurologic exam and denies any urinary retention or overflow incontinence, stool incontinence, saddle  Trauma and have low suspicion suspicion for cauda equina, fracture, epidural abscess, or  vertebral osteomyelitis. CT stone study done with PCP within normal limits.  Urine dip with PCP without signs of infection. CXR with PCP without signs of pneumonia.  Suspect patient with viral syndrome/viral URI and muscular aches related to this as well as coughing. Gave rx for flexeril and tessalon pearls. Patient discharged in stable condition with understanding of reasons to return.    Final Clinical Impressions(s) / ED Diagnoses   Final diagnoses:  URI (upper respiratory infection)  Muscle strain  Acute nonintractable headache, unspecified headache type  Low back pain without sciatica, unspecified back pain laterality    New Prescriptions Discharge Medication List as of 01/10/2016  7:36 PM    START taking these medications   Details  benzonatate (TESSALON) 100 MG capsule Take 1 capsule (100 mg total) by mouth 3 (three) times daily as needed for cough., Starting Fri 01/10/2016, Print      I personally performed the services described in this documentation, which was scribed in my presence. The recorded information has been reviewed and is accurate.    Gareth Morgan, MD 01/11/16 1346

## 2016-01-10 NOTE — Telephone Encounter (Signed)
Called pt. Some abd/flank pain now intermittent. So I order ct abd renal stone study stat. Radiology will call and schedule. Hold and call. Asked for test to be done today.

## 2016-01-10 NOTE — ED Triage Notes (Signed)
Pt c/o bil lower back / flank pain x 1 week also c/o h/a  X 1 day

## 2016-01-12 LAB — CULTURE, URINE COMPREHENSIVE

## 2016-01-17 ENCOUNTER — Other Ambulatory Visit: Payer: Self-pay | Admitting: Physician Assistant

## 2016-01-17 ENCOUNTER — Ambulatory Visit (INDEPENDENT_AMBULATORY_CARE_PROVIDER_SITE_OTHER): Payer: Federal, State, Local not specified - PPO | Admitting: Physician Assistant

## 2016-01-17 ENCOUNTER — Encounter: Payer: Self-pay | Admitting: Physician Assistant

## 2016-01-17 VITALS — BP 122/82 | HR 77 | Temp 97.9°F | Resp 16 | Ht 67.0 in | Wt 237.2 lb

## 2016-01-17 DIAGNOSIS — J208 Acute bronchitis due to other specified organisms: Secondary | ICD-10-CM

## 2016-01-17 DIAGNOSIS — M545 Low back pain, unspecified: Secondary | ICD-10-CM

## 2016-01-17 DIAGNOSIS — G44209 Tension-type headache, unspecified, not intractable: Secondary | ICD-10-CM

## 2016-01-17 MED ORDER — HYDROCODONE-HOMATROPINE 5-1.5 MG/5ML PO SYRP: 5.0000 mL | ORAL_SOLUTION | Freq: Three times a day (TID) | ORAL | 0 refills | Status: DC | PRN

## 2016-01-17 MED ORDER — TIZANIDINE HCL 4 MG PO CAPS: 4.0000 mg | ORAL_CAPSULE | Freq: Three times a day (TID) | ORAL | 0 refills | Status: DC | PRN

## 2016-01-17 MED ORDER — BENZONATATE 100 MG PO CAPS: 100.0000 mg | ORAL_CAPSULE | Freq: Three times a day (TID) | ORAL | 0 refills | Status: DC | PRN

## 2016-01-17 MED FILL — HYDROCODONE-HOMATROPINE SYR: 5-1.5 | 8 days supply | Qty: 120 | Fill #0

## 2016-01-17 NOTE — Patient Instructions (Addendum)
Please start a B Complex Multivitamin for the ringing in the ears. If not helping I recommend assessment by ENT.  Stay well hydrated and keep a humidifier running in the bedroom. Continue Tessalon Perles. Cough should continue to resolve. Let me know if anything worsens of new symptoms develop.  Try the Zanaflex for tension headache.  Topical Icy Hot or Aspercreme to the area. Heating pad as directed.   [id number], [place] [address]

## 2016-01-17 NOTE — Progress Notes (Signed)
 Patient presents to clinic today for ER follow-up.  Patient seen in ER on 01/10/16 for multiple complaints. Had assessment in the ER with no alarm findings. Was diagnosed with Viral URI w cough, LBP and headache. Was given supportive measures and Rx for Flexeril and Tessalon Perles.   Viral URI -- Patient endorses resolution of chest congestion and fatigue. Endorses some residual dry cough that is improving. Denies fever, chills, malaise. Denies chest pain, SOB. Is taking Tessalon with good relief of cough.  Low Back Pain -- Felt to be muscular. Patient denies any residual low back pain or discomfort. Endorses normal ROM without pain at present. Has noted some intermittent neck pain with tension headache, mainly left sided. Denies change to vision. Denies trauma or injury.  Past Medical History:  Diagnosis Date  . Allergic rhinitis due to other allergen   . Allergy    seasonal  . Anemia   . Arthritis   . Asthma    mild, intermittent  . Chicken pox   . Cholelithiasis   . CTS (carpal tunnel syndrome)   . Fibroids   . Fibromyalgia   . Frequent urination   . GERD (gastroesophageal reflux disease)   . Headache(784.0)   . Migraine   . PONV (postoperative nausea and vomiting)    difficulty waking up  . Problems related to high-risk sexual behavior    Unprotected sex  . Symptomatic menopausal or female climacteric states     Current Outpatient Prescriptions on File Prior to Visit  Medication Sig Dispense Refill  . albuterol (PROVENTIL HFA;VENTOLIN HFA) 108 (90 Base) MCG/ACT inhaler Inhale 2 puffs into the lungs every 6 (six) hours as needed for wheezing or shortness of breath. 1 Inhaler 0  . benzonatate (TESSALON) 100 MG capsule Take 1 capsule (100 mg total) by mouth 3 (three) times daily as needed for cough. 21 capsule 0  . Biotin 1000 MCG tablet Take 1,000 mcg by mouth daily.    . cetirizine (ZYRTEC) 10 MG tablet TAKE 1/2 TABLET BY MOUTH EVERY DAY (Patient taking differently: TAKE  ONE TABLET BY MOUTH EVERY DAY) 30 tablet 5  . cyclobenzaprine (FLEXERIL) 10 MG tablet Take 1 tablet (10 mg total) by mouth 2 (two) times daily as needed for muscle spasms. 20 tablet 0  . esomeprazole (NEXIUM) 40 MG capsule TAKE 1 CAPSULE BY MOUTH EVERY MORNING BEFORE BEAKFEST 90 capsule 1  . fluticasone (FLOVENT HFA) 110 MCG/ACT inhaler Inhale 2 puffs into the lungs 2 (two) times daily at 10 AM and 5 PM. (Patient taking differently: Inhale 2 puffs into the lungs 2 (two) times daily as needed. ) 1 Inhaler 2  . HYDROcodone-homatropine (HYCODAN) 5-1.5 MG/5ML syrup Take 5 mLs by mouth every 8 (eight) hours as needed for cough. 120 mL 0  . Multiple Vitamins-Minerals (MULTIVITAMIN WITH MINERALS) tablet Take 1 tablet by mouth daily. Daily MVI w/Iron; Alternate w/Prenatal MVI    . NON FORMULARY Take 3 tablets by mouth as needed. Swiss Kriss Herbal Laxative Tab    . SUMAtriptan (IMITREX) 25 MG tablet Take 1 tablet (25 mg total) by mouth once. May repeat in 2 hours if headache persists or recurs. 10 tablet 0  . vitamin E 400 UNIT capsule Take 400 Units by mouth daily.    . zonisamide (ZONEGRAN) 25 MG capsule Take 1 capsule (25 mg total) by mouth daily as needed. 30 capsule 3  . [DISCONTINUED] phentermine 37.5 MG capsule Take 37.5 mg by mouth every morning.       No current facility-administered medications on file prior to visit.     Allergies  Allergen Reactions  . Iodine Anaphylaxis  . Shellfish Allergy Anaphylaxis  . Latex Rash    Family History  Problem Relation Age of Onset  . Diabetes Mother     Deceased  . Hypertension Mother   . Anemia Mother   . Hyperlipidemia Mother   . Arthritis/Rheumatoid Mother   . Migraines Mother   . CVA Mother   . Hypertension Father     Deceased  . Stroke Father   . Diabetes Father     Legs Amputated  . Heart disease Father   . Arthritis Father   . Cancer Father   . Heart attack Maternal Aunt   . Diabetes Maternal Uncle   . Migraines Maternal Aunt   .  Asthma Maternal Aunt   . Asthma Maternal Uncle   . Stomach cancer Sister   . Breast cancer Sister   . Uterine cancer Sister     or Ovarian  . Sleep apnea Brother   . COPD Brother   . Asthma Sister     x2  . Sudden death Brother     SIDS  . Hypertension Brother   . Diabetes Sister   . Asthma Daughter     x2  . Allergies Daughter   . Sickle cell trait Other     All Children  . Atrial fibrillation Daughter   . Anxiety disorder Daughter   . Allergies Son   . Hypertension Sister   . Heart disease Sister   . Asthma Brother     Social History   Social History  . Marital status: Single    Spouse name: N/A  . Number of children: N/A  . Years of education: N/A   Social History Main Topics  . Smoking status: Never Smoker  . Smokeless tobacco: Never Used  . Alcohol use Yes     Comment: rarely   . Drug use: No  . Sexual activity: No   Other Topics Concern  . None   Social History Narrative  . None    Review of Systems - See HPI.  All other ROS are negative.  BP 122/82 (BP Location: Left Arm, Patient Position: Sitting, Cuff Size: Large)   Pulse 77   Temp 97.9 F (36.6 C) (Oral)   Resp 16   Ht 5' 7" (1.702 m)   Wt 237 lb 4 oz (107.6 kg)   SpO2 99%   BMI 37.16 kg/m   Physical Exam  Constitutional: She is oriented to person, place, and time and well-developed, well-nourished, and in no distress.  HENT:  Head: Normocephalic and atraumatic.  Right Ear: External ear normal.  Left Ear: External ear normal.  Nose: Nose normal.  Mouth/Throat: Oropharynx is clear and moist. No oropharyngeal exudate.  TM within normal limits bilaterally.  Eyes: Conjunctivae are normal.  Neck: Full passive range of motion without pain. Neck supple. Muscular tenderness present. No spinous process tenderness present.  Cardiovascular: Normal rate, regular rhythm, normal heart sounds and intact distal pulses.   Pulmonary/Chest: Effort normal and breath sounds normal. No respiratory  distress. She has no wheezes. She has no rales. She exhibits no tenderness.  Neurological: She is alert and oriented to person, place, and time.  Skin: Skin is warm and dry. No rash noted.  Psychiatric: Affect normal.  Vitals reviewed.   Recent Results (from the past 2160 hour(s))  POCT rapid strep A       Status: Normal   Collection Time: 01/09/16  2:00 PM  Result Value Ref Range   Rapid Strep A Screen Negative Negative  CULTURE, URINE COMPREHENSIVE     Status: None   Collection Time: 01/09/16  3:25 PM  Result Value Ref Range   Colony Count 1,000-10,000 CFU/mL    Organism ID, Bacteria CORYNEBACTERIUM SPECIES   CBC w/Diff     Status: Abnormal   Collection Time: 01/09/16  3:25 PM  Result Value Ref Range   WBC 4.9 4.0 - 10.5 K/uL   RBC 5.18 (H) 3.87 - 5.11 Mil/uL   Hemoglobin 13.2 12.0 - 15.0 g/dL   HCT 40.0 36.0 - 46.0 %   MCV 77.1 (L) 78.0 - 100.0 fl   MCHC 33.1 30.0 - 36.0 g/dL   RDW 15.0 11.5 - 15.5 %   Platelets 239.0 150.0 - 400.0 K/uL   Neutrophils Relative % 36.4 (L) 43.0 - 77.0 %   Lymphocytes Relative 47.4 (H) 12.0 - 46.0 %   Monocytes Relative 9.5 3.0 - 12.0 %   Eosinophils Relative 6.4 (H) 0.0 - 5.0 %   Basophils Relative 0.3 0.0 - 3.0 %   Neutro Abs 1.8 1.4 - 7.7 K/uL   Lymphs Abs 2.3 0.7 - 4.0 K/uL   Monocytes Absolute 0.5 0.1 - 1.0 K/uL   Eosinophils Absolute 0.3 0.0 - 0.7 K/uL   Basophils Absolute 0.0 0.0 - 0.1 K/uL  Comp Met (CMET)     Status: None   Collection Time: 01/09/16  3:25 PM  Result Value Ref Range   Sodium 139 135 - 145 mEq/L   Potassium 4.1 3.5 - 5.1 mEq/L   Chloride 105 96 - 112 mEq/L   CO2 29 19 - 32 mEq/L   Glucose, Bld 96 70 - 99 mg/dL   BUN 6 6 - 23 mg/dL   Creatinine, Ser 0.72 0.40 - 1.20 mg/dL   Total Bilirubin 0.3 0.2 - 1.2 mg/dL   Alkaline Phosphatase 61 39 - 117 U/L   AST 20 0 - 37 U/L   ALT 20 0 - 35 U/L   Total Protein 7.9 6.0 - 8.3 g/dL   Albumin 3.8 3.5 - 5.2 g/dL   Calcium 9.1 8.4 - 10.5 mg/dL   GFR 108.74 >60.00 mL/min    POCT Urinalysis Dipstick     Status: Abnormal   Collection Time: 01/09/16  3:34 PM  Result Value Ref Range   Color, UA Yellow    Clarity, UA Clear    Glucose, UA Neg    Bilirubin, UA Neg    Ketones, UA Neg    Spec Grav, UA 1.025    Blood, UA +    pH, UA 6.0    Protein, UA Neg    Urobilinogen, UA 4.0    Nitrite, UA Neg    Leukocytes, UA Negative Negative    Assessment/Plan: 1. Tension headache Rx Zanaflex. Recommended topical Icy Hot and heating pad. Massage recommended. FU if not improving.  2. Acute low back pain without sciatica, unspecified back pain laterality Resolved. Continue supportive measures.  3. Viral bronchitis Only dry cough remains. Is improving daily. Exam unremarkable for sign of continued infection. Continue cough medication and supportive measures. Return precautions discussed with patient.   Martin, William Cody, PA-C 

## 2016-01-17 NOTE — Progress Notes (Signed)
Pre visit review using our clinic review tool, if applicable. No additional management support is needed unless otherwise documented below in the visit note/SLS  

## 2016-03-06 ENCOUNTER — Other Ambulatory Visit: Payer: Self-pay | Admitting: Medical

## 2016-03-10 ENCOUNTER — Other Ambulatory Visit: Payer: Self-pay | Admitting: Physician Assistant

## 2016-03-10 ENCOUNTER — Telehealth: Payer: Self-pay | Admitting: Physician Assistant

## 2016-03-10 DIAGNOSIS — J208 Acute bronchitis due to other specified organisms: Principal | ICD-10-CM

## 2016-03-10 DIAGNOSIS — B9689 Other specified bacterial agents as the cause of diseases classified elsewhere: Secondary | ICD-10-CM

## 2016-03-10 MED ORDER — ESOMEPRAZOLE MAGNESIUM 40 MG PO CPDR: DELAYED_RELEASE_CAPSULE | ORAL | 1 refills | Status: DC

## 2016-03-10 MED ORDER — ALBUTEROL SULFATE HFA 108 (90 BASE) MCG/ACT IN AERS: INHALATION_SPRAY | RESPIRATORY_TRACT | 5 refills | Status: DC

## 2016-03-10 NOTE — Telephone Encounter (Signed)
Refills have been sent.  

## 2016-03-10 NOTE — Telephone Encounter (Signed)
Patient informed. 

## 2016-03-10 NOTE — Telephone Encounter (Signed)
°  Relation to [id number] Call back number:[phone] Pharmacy: [facility] [place], [place] [phone] (Phone) [phone] (Fax)     Reason for call:  Patient currently at pharmacy  requesting esomeprazole (NEXIUM) 40 MG capsule and PROVENTIL HFA 108 (90 Base) MCG/ACT inhaler, please advise

## 2016-04-20 ENCOUNTER — Telehealth: Payer: Self-pay | Admitting: Physician Assistant

## 2016-04-20 DIAGNOSIS — K581 Irritable bowel syndrome with constipation: Secondary | ICD-10-CM

## 2016-04-20 NOTE — Telephone Encounter (Signed)
Pt would like to have rx refilled (Amatera) per pts spelling. Please call pt when ready or to discuss further. Thank you.

## 2016-04-21 MED ORDER — LUBIPROSTONE 8 MCG PO CAPS: 8.0000 ug | ORAL_CAPSULE | Freq: Two times a day (BID) | ORAL | 0 refills | Status: DC

## 2016-04-21 NOTE — Telephone Encounter (Signed)
LMOVM advising patient for return call to discuss medication requested.

## 2016-04-21 NOTE — Addendum Note (Signed)
Addended by: Leonidas Romberg on: 04/21/2016 09:36 AM   Modules accepted: Orders

## 2016-04-21 NOTE — Telephone Encounter (Signed)
Spoke with patient she requested refill of the Amitiza 8 mcg. Patient states she was taking before but the cost went up to $100. She has found a coupon that she only has to pay $20 per month.

## 2016-04-21 NOTE — Telephone Encounter (Signed)
She had recent labs that looked good.  Ok to send in 30-day supply. She needs FU before further refills.

## 2016-04-21 NOTE — Telephone Encounter (Signed)
Rx sent into the pharmacy. Will have patient schedule a follow up appointment for more refills

## 2016-04-22 ENCOUNTER — Other Ambulatory Visit: Payer: Self-pay | Admitting: General Practice

## 2016-04-22 ENCOUNTER — Telehealth: Payer: Self-pay | Admitting: General Practice

## 2016-04-22 NOTE — Telephone Encounter (Signed)
No patient needs follow-up before full fill as she is overdue. Last CPE 12/2014.

## 2016-04-22 NOTE — Telephone Encounter (Signed)
Patient is coming in this Friday 04/24/16 for follow-up.

## 2016-04-22 NOTE — Telephone Encounter (Signed)
PA for amitizia was approved for #180 for a 90 day supply form 04/13/16 until 07-12-16.   Please advise if ok to fill for #180 since pt is due for a follow up and #60 was approved

## 2016-04-23 DIAGNOSIS — H6990 Unspecified Eustachian tube disorder, unspecified ear: Secondary | ICD-10-CM | POA: Insufficient documentation

## 2016-04-23 DIAGNOSIS — J309 Allergic rhinitis, unspecified: Secondary | ICD-10-CM | POA: Diagnosis not present

## 2016-04-23 DIAGNOSIS — H9313 Tinnitus, bilateral: Secondary | ICD-10-CM | POA: Diagnosis not present

## 2016-04-23 DIAGNOSIS — M26623 Arthralgia of bilateral temporomandibular joint: Secondary | ICD-10-CM | POA: Diagnosis not present

## 2016-04-23 DIAGNOSIS — H6980 Other specified disorders of Eustachian tube, unspecified ear: Secondary | ICD-10-CM | POA: Diagnosis not present

## 2016-04-24 ENCOUNTER — Ambulatory Visit (INDEPENDENT_AMBULATORY_CARE_PROVIDER_SITE_OTHER): Payer: Federal, State, Local not specified - PPO | Admitting: Physician Assistant

## 2016-04-24 ENCOUNTER — Encounter: Payer: Self-pay | Admitting: Physician Assistant

## 2016-04-24 DIAGNOSIS — K581 Irritable bowel syndrome with constipation: Secondary | ICD-10-CM | POA: Diagnosis not present

## 2016-04-24 MED ORDER — LUBIPROSTONE 8 MCG PO CAPS: 8.0000 ug | ORAL_CAPSULE | Freq: Two times a day (BID) | ORAL | 0 refills | Status: DC

## 2016-04-24 NOTE — Patient Instructions (Signed)
We will send in a prescription for the Amitiza to your pharmacy. The dose will be 8 mcg tablet twice daily. Our goal is to get a bowel movement daily if possible.   Continue good hydration and fiber intake.   Follow-up with me in 1 month. We will do your complete physical at that time.   If you note any side effect of the medication, please stop and call me.

## 2016-04-24 NOTE — Progress Notes (Signed)
Pre visit review using our clinic review tool, if applicable. No additional management support is needed unless otherwise documented below in the visit note. 

## 2016-04-25 NOTE — Progress Notes (Signed)
Patient presents to clinic today for medication management. Patient with history of constipation predominant IBS. Patient previously averaging 1 bowel movement every 3-4 days associated with bloating, hard stool and tenesmus. Symptoms were not alleviated with OTC medications, dietary changes, etc. Patient was started on Amitiza 24 mcg once daily. Notes BM went from seldom to 2-3 per day. Endorses soft stools overall. Noted feeling much better on this regimen but loose stools. Patient has been out of medication for some time now and as such is having significant constipation. LBM this morning. Denies melena or hematochezia.   Past Medical History:  Diagnosis Date  . Allergic rhinitis due to other allergen   . Allergy    seasonal  . Anemia   . Arthritis   . Asthma    mild, intermittent  . Chicken pox   . Cholelithiasis   . CTS (carpal tunnel syndrome)   . Fibroids   . Fibromyalgia   . Frequent urination   . GERD (gastroesophageal reflux disease)   . Headache(784.0)   . Migraine   . PONV (postoperative nausea and vomiting)    difficulty waking up  . Problems related to high-risk sexual behavior    Unprotected sex  . Symptomatic menopausal or female climacteric states     Current Outpatient Prescriptions on File Prior to Visit  Medication Sig Dispense Refill  . Biotin 1000 MCG tablet Take 1,000 mcg by mouth daily.    . cetirizine (ZYRTEC) 10 MG tablet TAKE 1/2 TABLET BY MOUTH EVERY DAY (Patient taking differently: TAKE ONE TABLET BY MOUTH EVERY DAY) 30 tablet 5  . esomeprazole (NEXIUM) 40 MG capsule TAKE 1 CAPSULE BY MOUTH EVERY MORNING BEFORE BREAKFAST 90 capsule 1  . Multiple Vitamins-Minerals (MULTIVITAMIN WITH MINERALS) tablet Take 1 tablet by mouth daily. Daily MVI w/Iron; Alternate w/Prenatal MVI    . NON FORMULARY Take 3 tablets by mouth as needed. Swiss Kriss Herbal Laxative Tab    . tiZANidine (ZANAFLEX) 4 MG capsule TAKE 1 CAPSULE(4 MG) BY MOUTH THREE TIMES DAILY AS NEEDED  FOR MUSCLE SPASMS 270 capsule 0  . vitamin E 400 UNIT capsule Take 400 Units by mouth daily.    . fluticasone (FLOVENT HFA) 110 MCG/ACT inhaler Inhale 2 puffs into the lungs 2 (two) times daily at 10 AM and 5 PM. (Patient taking differently: Inhale 2 puffs into the lungs 2 (two) times daily as needed. ) 1 Inhaler 2  . PROVENTIL HFA 108 (90 Base) MCG/ACT inhaler INHALE 1 TO 2 PUFFS INTO THE LUNGS EVERY 6 HOURS AS NEEDED FOR WHEEZING OR SHORTNESS OF BREATH 18 g 3  . [DISCONTINUED] phentermine 37.5 MG capsule Take 37.5 mg by mouth every morning.     No current facility-administered medications on file prior to visit.     Allergies  Allergen Reactions  . Iodine Anaphylaxis  . Shellfish Allergy Anaphylaxis  . Latex Rash    Family History  Problem Relation Age of Onset  . Diabetes Mother     Deceased  . Hypertension Mother   . Anemia Mother   . Hyperlipidemia Mother   . Arthritis/Rheumatoid Mother   . Migraines Mother   . CVA Mother   . Hypertension Father     Deceased  . Stroke Father   . Diabetes Father     Legs Amputated  . Heart disease Father   . Arthritis Father   . Cancer Father   . Heart attack Maternal Aunt   . Diabetes Maternal Uncle   .  Migraines Maternal Aunt   . Asthma Maternal Aunt   . Asthma Maternal Uncle   . Stomach cancer Sister   . Breast cancer Sister   . Uterine cancer Sister     or Ovarian  . Sleep apnea Brother   . COPD Brother   . Asthma Sister     x2  . Sudden death Brother     SIDS  . Hypertension Brother   . Diabetes Sister   . Asthma Daughter     x2  . Allergies Daughter   . Sickle cell trait Other     All Children  . Atrial fibrillation Daughter   . Anxiety disorder Daughter   . Allergies Son   . Hypertension Sister   . Heart disease Sister   . Asthma Brother     Social History   Social History  . Marital status: Single    Spouse name: N/A  . Number of children: N/A  . Years of education: N/A   Social History Main Topics    . Smoking status: Never Smoker  . Smokeless tobacco: Never Used  . Alcohol use Yes     Comment: rarely   . Drug use: No  . Sexual activity: No   Other Topics Concern  . None   Social History Narrative  . None    Review of Systems - See HPI.  All other ROS are negative.  BP 118/70   Pulse 76   Temp 98.2 F (36.8 C) (Oral)   Resp 16   Ht 5\' 7"  (1.702 m)   Wt 230 lb (104.3 kg)   SpO2 98%   BMI 36.02 kg/m   Physical Exam  Constitutional: She is oriented to person, place, and time and well-developed, well-nourished, and in no distress.  HENT:  Head: Normocephalic and atraumatic.  Eyes: Conjunctivae are normal.  Neck: Neck supple.  Cardiovascular: Normal rate, regular rhythm, normal heart sounds and intact distal pulses.   Pulmonary/Chest: Effort normal and breath sounds normal. No respiratory distress. She has no wheezes. She has no rales. She exhibits no tenderness.  Abdominal: Soft. Bowel sounds are normal. She exhibits no distension and no mass. There is no tenderness. There is no rebound and no guarding.  Neurological: She is alert and oriented to person, place, and time.  Skin: Skin is warm and dry. No rash noted.  Psychiatric: Affect normal.  Vitals reviewed.   No results found for this or any previous visit (from the past 2160 hour(s)).  Assessment/Plan: IBS (irritable bowel syndrome) BS within normal limits. No distention or tenderness on examination. Dietary measures discussed. Will restart Amitiza but will restart at 8 mcg BID instead of 24 mcg once daily to see if we get relief without diarrhea. TSH has been normal on previous checks. Patient scheduled for her CPE. Will obtain repeat TSH at that time.     Leeanne Rio, PA-C

## 2016-04-25 NOTE — Assessment & Plan Note (Signed)
BS within normal limits. No distention or tenderness on examination. Dietary measures discussed. Will restart Amitiza but will restart at 8 mcg BID instead of 24 mcg once daily to see if we get relief without diarrhea. TSH has been normal on previous checks. Patient scheduled for her CPE. Will obtain repeat TSH at that time.

## 2016-04-28 ENCOUNTER — Telehealth: Payer: Self-pay | Admitting: Emergency Medicine

## 2016-04-28 NOTE — Telephone Encounter (Signed)
Prior authorization approved for Amitiza 8 mcg bid Approved from 04/13/16-07/21/16. Notified pharmacy of approval

## 2016-05-15 ENCOUNTER — Encounter: Payer: Self-pay | Admitting: Physician Assistant

## 2016-05-16 ENCOUNTER — Other Ambulatory Visit: Payer: Self-pay | Admitting: Physician Assistant

## 2016-05-16 MED ORDER — LEVALBUTEROL TARTRATE 45 MCG/ACT IN AERO: 1.0000 | INHALATION_SPRAY | Freq: Four times a day (QID) | RESPIRATORY_TRACT | 12 refills | Status: DC | PRN

## 2016-05-19 MED ORDER — LINACLOTIDE 72 MCG PO CAPS: 72.0000 ug | ORAL_CAPSULE | Freq: Every day | ORAL | 1 refills | Status: DC

## 2016-05-21 ENCOUNTER — Telehealth: Payer: Self-pay | Admitting: *Deleted

## 2016-05-21 DIAGNOSIS — K080 Exfoliation of teeth due to systemic causes: Secondary | ICD-10-CM | POA: Diagnosis not present

## 2016-05-21 NOTE — Telephone Encounter (Signed)
Medication approved from 04/13/16-08/19/16  Fax sent to Pharmacy, message left for patient so that she is aware.

## 2016-05-21 NOTE — Telephone Encounter (Signed)
Start PA through [place]  Waiting response from insurance company

## 2016-05-22 ENCOUNTER — Encounter: Payer: Self-pay | Admitting: Physician Assistant

## 2016-05-22 ENCOUNTER — Ambulatory Visit (INDEPENDENT_AMBULATORY_CARE_PROVIDER_SITE_OTHER): Payer: Federal, State, Local not specified - PPO | Admitting: Physician Assistant

## 2016-05-22 VITALS — BP 118/70 | HR 75 | Temp 97.7°F | Resp 14 | Ht 67.0 in | Wt 233.0 lb

## 2016-05-22 DIAGNOSIS — G43709 Chronic migraine without aura, not intractable, without status migrainosus: Secondary | ICD-10-CM | POA: Diagnosis not present

## 2016-05-22 DIAGNOSIS — K581 Irritable bowel syndrome with constipation: Secondary | ICD-10-CM | POA: Diagnosis not present

## 2016-05-22 DIAGNOSIS — Z0000 Encounter for general adult medical examination without abnormal findings: Secondary | ICD-10-CM

## 2016-05-22 LAB — COMPREHENSIVE METABOLIC PANEL
ALT: 21 U/L (ref 0–35)
AST: 18 U/L (ref 0–37)
Albumin: 4 g/dL (ref 3.5–5.2)
Alkaline Phosphatase: 55 U/L (ref 39–117)
BILIRUBIN TOTAL: 0.4 mg/dL (ref 0.2–1.2)
BUN: 9 mg/dL (ref 6–23)
CALCIUM: 9.2 mg/dL (ref 8.4–10.5)
CHLORIDE: 104 meq/L (ref 96–112)
CO2: 29 meq/L (ref 19–32)
CREATININE: 0.66 mg/dL (ref 0.40–1.20)
GFR: 120.06 mL/min (ref >60.00)
GLUCOSE: 99 mg/dL (ref 70–99)
Potassium: 4.3 mEq/L (ref 3.5–5.1)
Sodium: 138 mEq/L (ref 135–145)
Total Protein: 7.3 g/dL (ref 6.0–8.3)

## 2016-05-22 LAB — LIPID PANEL
CHOLESTEROL: 146 mg/dL (ref 0–200)
HDL: 55.7 mg/dL (ref >39.00)
LDL CALC: 72 mg/dL (ref 0–99)
NonHDL: 90.14
Total CHOL/HDL Ratio: 3
Triglycerides: 89 mg/dL (ref 0.0–149.0)
VLDL: 17.8 mg/dL (ref 0.0–40.0)

## 2016-05-22 LAB — URINALYSIS, ROUTINE W REFLEX MICROSCOPIC
BILIRUBIN URINE: NEGATIVE
HGB URINE DIPSTICK: NEGATIVE
KETONES UR: NEGATIVE
LEUKOCYTES UA: NEGATIVE
NITRITE: NEGATIVE
Specific Gravity, Urine: <=1.005 — AB (ref 1.000–1.030)
Total Protein, Urine: NEGATIVE
Urine Glucose: NEGATIVE
Urobilinogen, UA: 0.2 (ref 0.0–1.0)
pH: 5.5 (ref 5.0–8.0)

## 2016-05-22 LAB — CBC WITH DIFFERENTIAL/PLATELET
BASOS ABS: 0 10*3/uL (ref 0.0–0.1)
BASOS PCT: 0.7 % (ref 0.0–3.0)
Eosinophils Absolute: 0.1 10*3/uL (ref 0.0–0.7)
Eosinophils Relative: 2.6 % (ref 0.0–5.0)
HEMATOCRIT: 39.3 % (ref 36.0–46.0)
Hemoglobin: 12.7 g/dL (ref 12.0–15.0)
LYMPHS ABS: 2.6 10*3/uL (ref 0.7–4.0)
LYMPHS PCT: 48.4 % — AB (ref 12.0–46.0)
MCHC: 32.4 g/dL (ref 30.0–36.0)
MCV: 79.6 fl (ref 78.0–100.0)
MONOS PCT: 8.5 % (ref 3.0–12.0)
Monocytes Absolute: 0.5 10*3/uL (ref 0.1–1.0)
NEUTROS ABS: 2.2 10*3/uL (ref 1.4–7.7)
NEUTROS PCT: 39.8 % — AB (ref 43.0–77.0)
PLATELETS: 231 10*3/uL (ref 150.0–400.0)
RBC: 4.94 Mil/uL (ref 3.87–5.11)
RDW: 15.7 % — AB (ref 11.5–15.5)
WBC: 5.4 10*3/uL (ref 4.0–10.5)

## 2016-05-22 LAB — HEMOGLOBIN A1C: Hgb A1c MFr Bld: 6.4 % (ref 4.6–6.5)

## 2016-05-22 LAB — TSH: TSH: 1.13 u[IU]/mL (ref 0.35–4.50)

## 2016-05-22 NOTE — Assessment & Plan Note (Signed)
Depression screen negative. Health Maintenance reviewed -- Declines flu shot. Other immunizations, mammogram, PAP, colonoscopy up-to-date. Preventive schedule discussed and handout given in AVS. Will obtain fasting labs today.

## 2016-05-22 NOTE — Assessment & Plan Note (Signed)
Amitiza was not covered this year. Had to switch to Linzess. Insurance approved. Patient to pick up and start medication as directed. FU discussed.

## 2016-05-22 NOTE — Patient Instructions (Signed)
Please go to the lab for blood work.   Our office will call you with your results unless you have chosen to receive results via MyChart.  If your blood work is normal we will follow-up each year for physicals and as scheduled for chronic medical problems.  If anything is abnormal we will treat accordingly and get you in for a follow-up.  Please start Linzess as directed. Let me know how this is working for you.  Follow-up with your specialists as scheduled.  We will schedule follow-up here based on your lab results.   Preventive Care 40-64 Years, Female Preventive care refers to lifestyle choices and visits with your health care provider that can promote health and wellness. What does preventive care include?  A yearly physical exam. This is also called an annual well check.  Dental exams once or twice a year.  Routine eye exams. Ask your health care provider how often you should have your eyes checked.  Personal lifestyle choices, including:  Daily care of your teeth and gums.  Regular physical activity.  Eating a healthy diet.  Avoiding tobacco and drug use.  Limiting alcohol use.  Practicing safe sex.  Taking low-dose aspirin daily starting at age 29.  Taking vitamin and mineral supplements as recommended by your health care provider. What happens during an annual well check? The services and screenings done by your health care provider during your annual well check will depend on your age, overall health, lifestyle risk factors, and family history of disease. Counseling  Your health care provider may ask you questions about your:  Alcohol use.  Tobacco use.  Drug use.  Emotional well-being.  Home and relationship well-being.  Sexual activity.  Eating habits.  Work and work Statistician.  Method of birth control.  Menstrual cycle.  Pregnancy history. Screening  You may have the following tests or measurements:  Height, weight, and  BMI.  Blood pressure.  Lipid and cholesterol levels. These may be checked every 5 years, or more frequently if you are over 49 years old.  Skin check.  Lung cancer screening. You may have this screening every year starting at age 50 if you have a 30-pack-year history of smoking and currently smoke or have quit within the past 15 years.  Fecal occult blood test (FOBT) of the stool. You may have this test every year starting at age 33.  Flexible sigmoidoscopy or colonoscopy. You may have a sigmoidoscopy every 5 years or a colonoscopy every 10 years starting at age 18.  Hepatitis C blood test.  Hepatitis B blood test.  Sexually transmitted disease (STD) testing.  Diabetes screening. This is done by checking your blood sugar (glucose) after you have not eaten for a while (fasting). You may have this done every 1-3 years.  Mammogram. This may be done every 1-2 years. Talk to your health care provider about when you should start having regular mammograms. This may depend on whether you have a family history of breast cancer.  BRCA-related cancer screening. This may be done if you have a family history of breast, ovarian, tubal, or peritoneal cancers.  Pelvic exam and Pap test. This may be done every 3 years starting at age 35. Starting at age 1, this may be done every 5 years if you have a Pap test in combination with an HPV test.  Bone density scan. This is done to screen for osteoporosis. You may have this scan if you are at high risk for osteoporosis. Discuss  your test results, treatment options, and if necessary, the need for more tests with your health care provider. Vaccines  Your health care provider may recommend certain vaccines, such as:  Influenza vaccine. This is recommended every year.  Tetanus, diphtheria, and acellular pertussis (Tdap, Td) vaccine. You may need a Td booster every 10 years.  Varicella vaccine. You may need this if you have not been vaccinated.  Zoster  vaccine. You may need this after age 29.  Measles, mumps, and rubella (MMR) vaccine. You may need at least one dose of MMR if you were born in 1957 or later. You may also need a second dose.  Pneumococcal 13-valent conjugate (PCV13) vaccine. You may need this if you have certain conditions and were not previously vaccinated.  Pneumococcal polysaccharide (PPSV23) vaccine. You may need one or two doses if you smoke cigarettes or if you have certain conditions.  Meningococcal vaccine. You may need this if you have certain conditions.  Hepatitis A vaccine. You may need this if you have certain conditions or if you travel or work in places where you may be exposed to hepatitis A.  Hepatitis B vaccine. You may need this if you have certain conditions or if you travel or work in places where you may be exposed to hepatitis B.  Haemophilus influenzae type b (Hib) vaccine. You may need this if you have certain conditions. Talk to your health care provider about which screenings and vaccines you need and how often you need them. This information is not intended to replace advice given to you by your health care provider. Make sure you discuss any questions you have with your health care provider. Document Released: 04/26/2015 Document Revised: 12/18/2015 Document Reviewed: 01/29/2015 Elsevier Interactive Patient Education  2017 [employer].

## 2016-05-22 NOTE — Assessment & Plan Note (Signed)
Followed by [facility].  Continue care as discussed by specialist.

## 2016-05-22 NOTE — Progress Notes (Signed)
Patient presents to clinic today for annual exam.  Patient is fasting for labs. Body mass index is 36.49 kg/m. Exercise bike. Ab Lounger. Core strengthening. -- Has started exercise regimen. Working up to several days per week.  Is eating a clean diet with good fiber intake due to history of chronic constipation.  Followed by Headache clinic for chronic migraines. Is currently on Zonegran for acute headaches prescribed by specialist. Notes symptoms very well-controlled.  Past Medical History:  Diagnosis Date  . Allergic rhinitis due to other allergen   . Allergy    seasonal  . Anemia   . Arthritis   . Asthma    mild, intermittent  . Chicken pox   . Cholelithiasis   . CTS (carpal tunnel syndrome)   . Fibroids   . Fibromyalgia   . Frequent urination   . GERD (gastroesophageal reflux disease)   . Headache(784.0)   . Migraine   . PONV (postoperative nausea and vomiting)    difficulty waking up  . Problems related to high-risk sexual behavior    Unprotected sex  . Symptomatic menopausal or female climacteric states     Past Surgical History:  Procedure Laterality Date  . ABDOMINAL HYSTERECTOMY  2008   partial, ovaries remain  . CARPAL TUNNEL RELEASE Right 2015  . CHOLECYSTECTOMY  05/19/2011   Procedure: LAPAROSCOPIC CHOLECYSTECTOMY;  Surgeon: Zenovia Jarred, MD;  Location: [facility];  Service: General;  Laterality: N/A;  . LASER ABLATION [place] / VULVAR    . PERCUTANEOUS PINNING  01/22/2012   Procedure: PERCUTANEOUS PINNING EXTREMITY;  Surgeon: Marybelle Killings, MD;  Location: [facility];  Service: Orthopedics;  Laterality: Right;  Closed Reduction and Pinning right 5th Metacarpal Fracture  . TUBAL LIGATION    . WISDOM TOOTH EXTRACTION      Current Outpatient Prescriptions on File Prior to Visit  Medication Sig Dispense Refill  . Biotin 1000 MCG tablet Take 1,000 mcg by mouth daily.    . cetirizine (ZYRTEC) 10 MG tablet TAKE 1/2 TABLET BY MOUTH EVERY DAY (Patient  taking differently: TAKE ONE TABLET BY MOUTH EVERY DAY) 30 tablet 5  . esomeprazole (NEXIUM) 40 MG capsule TAKE 1 CAPSULE BY MOUTH EVERY MORNING BEFORE BREAKFAST 90 capsule 1  . levalbuterol (XOPENEX HFA) 45 MCG/ACT inhaler INHALE 1 TO 2 PUFFS INTO THE LUNGS EVERY 6 HOURS AS NEEDED FOR WHEEZING 45 g 3  . linaclotide (LINZESS) 72 MCG capsule Take 1 capsule (72 mcg total) by mouth daily before breakfast. 30 capsule 1  . Multiple Vitamins-Minerals (MULTIVITAMIN WITH MINERALS) tablet Take 1 tablet by mouth daily. Daily MVI w/Iron; Alternate w/Prenatal MVI    . NON FORMULARY Take 3 tablets by mouth as needed. Swiss Kriss Herbal Laxative Tab    . vitamin E 400 UNIT capsule Take 400 Units by mouth daily.    Marland Kitchen zonisamide (ZONEGRAN) 25 MG capsule Take 1 capsule by mouth daily.  5  . [DISCONTINUED] phentermine 37.5 MG capsule Take 37.5 mg by mouth every morning.     No current facility-administered medications on file prior to visit.     Allergies  Allergen Reactions  . Iodine Anaphylaxis  . Shellfish Allergy Anaphylaxis  . Latex Rash    Family History  Problem Relation Age of Onset  . Diabetes Mother     Deceased  . Hypertension Mother   . Anemia Mother   . Hyperlipidemia Mother   . Arthritis/Rheumatoid Mother   . Migraines Mother   . CVA  Mother   . Hypertension Father     Deceased  . Stroke Father   . Diabetes Father     Legs Amputated  . Heart disease Father   . Arthritis Father   . Cancer Father   . Heart attack Maternal Aunt   . Diabetes Maternal Uncle   . Migraines Maternal Aunt   . Asthma Maternal Aunt   . Asthma Maternal Uncle   . Stomach cancer Sister   . Breast cancer Sister   . Uterine cancer Sister     or Ovarian  . Sleep apnea Brother   . COPD Brother   . Asthma Sister     x2  . Sudden death Brother     SIDS  . Hypertension Brother   . Diabetes Sister   . Asthma Daughter     x2  . Allergies Daughter   . Sickle cell trait Other     All Children  . Atrial  fibrillation Daughter   . Anxiety disorder Daughter   . Allergies Son   . Hypertension Sister   . Heart disease Sister   . Asthma Brother     Social History   Social History  . Marital status: Single    Spouse name: N/A  . Number of children: N/A  . Years of education: N/A   Occupational History  . Not on file.   Social History Main Topics  . Smoking status: Never Smoker  . Smokeless tobacco: Never Used  . Alcohol use Yes     Comment: rarely   . Drug use: No  . Sexual activity: No   Other Topics Concern  . Not on file   Social History Narrative  . No narrative on file    Review of Systems  Constitutional: Negative for fever and weight loss.  HENT: Negative for ear discharge, ear pain, hearing loss and tinnitus.   Eyes: Negative for blurred vision, double vision, photophobia and pain.  Respiratory: Negative for cough and shortness of breath.   Cardiovascular: Negative for chest pain and palpitations.  Gastrointestinal: Positive for constipation. Negative for abdominal pain, blood in stool, diarrhea, heartburn, melena, nausea and vomiting.  Genitourinary: Negative for dysuria, flank pain, frequency, hematuria and urgency.  Musculoskeletal: Negative for falls.  Neurological: Negative for dizziness, loss of consciousness and headaches.  Endo/Heme/Allergies: Negative for environmental allergies.  Psychiatric/Behavioral: Negative for depression, hallucinations, substance abuse and suicidal ideas. The patient is not nervous/anxious and does not have insomnia.     BP 118/70   Pulse 75   Temp 97.7 F (36.5 C) (Oral)   Resp 14   Ht 5\' 7"  (1.702 m)   Wt 233 lb (105.7 kg)   SpO2 99%   BMI 36.49 kg/m   Physical Exam  Constitutional: She is oriented to person, place, and time and well-developed, well-nourished, and in no distress.  HENT:  Head: Normocephalic and atraumatic.  Right Ear: Tympanic membrane, external ear and ear canal normal.  Left Ear: Tympanic  membrane, external ear and ear canal normal.  Nose: Nose normal. No mucosal edema.  Mouth/Throat: Uvula is midline, oropharynx is clear and moist and mucous membranes are normal. No oropharyngeal exudate or posterior oropharyngeal erythema.  Eyes: Conjunctivae are normal. Pupils are equal, round, and reactive to light.  Neck: Neck supple. No thyromegaly present.  Cardiovascular: Normal rate, regular rhythm, normal heart sounds and intact distal pulses.   Pulmonary/Chest: Effort normal and breath sounds normal. No respiratory distress. She has no wheezes. She  has no rales.  Abdominal: Soft. Bowel sounds are normal. She exhibits no distension and no mass. There is no tenderness. There is no rebound and no guarding.  Lymphadenopathy:    She has no cervical adenopathy.  Neurological: She is alert and oriented to person, place, and time. No cranial nerve deficit.  Skin: Skin is warm and dry. No rash noted.  Psychiatric: Affect normal.  Vitals reviewed.  Assessment/Plan: Visit for preventive health examination Depression screen negative. Health Maintenance reviewed -- Declines flu shot. Other immunizations, mammogram, PAP, colonoscopy up-to-date. Preventive schedule discussed and handout given in AVS. Will obtain fasting labs today.   Migraines Followed by Headache Clinic.  Continue care as discussed by specialist.  IBS (irritable bowel syndrome) Amitiza was not covered this year. Had to switch to Linzess. Insurance approved. Patient to pick up and start medication as directed. FU discussed.    Leeanne Rio, PA-C

## 2016-05-22 NOTE — Progress Notes (Signed)
Pre visit review using our clinic review tool, if applicable. No additional management support is needed unless otherwise documented below in the visit note. 

## 2016-05-25 ENCOUNTER — Encounter: Payer: Self-pay | Admitting: Emergency Medicine

## 2016-05-26 ENCOUNTER — Telehealth: Payer: Self-pay | Admitting: Physician Assistant

## 2016-05-26 LAB — VITAMIN D 1,25 DIHYDROXY
VITAMIN D 1, 25 (OH) TOTAL: 36 pg/mL (ref 18–72)
Vitamin D2 1, 25 (OH)2: <8 pg/mL
Vitamin D3 1, 25 (OH)2: 36 pg/mL

## 2016-05-26 NOTE — Telephone Encounter (Signed)
Received from from [facility] regarding patient request for compression stockings and orthopedic footwear. Just wanting to verify with patient that she requested this before I proceed with form completion.   Please call patient to determine if she requested.

## 2016-05-27 ENCOUNTER — Encounter: Payer: Self-pay | Admitting: Emergency Medicine

## 2016-05-27 NOTE — Telephone Encounter (Signed)
Patient states that she did request this and would appreciate you filling out the forms!   Thanks

## 2016-05-27 NOTE — Telephone Encounter (Signed)
Patient notified

## 2016-05-27 NOTE — Telephone Encounter (Signed)
Forms have been completed and faxed.

## 2016-05-29 ENCOUNTER — Other Ambulatory Visit: Payer: Self-pay | Admitting: Physician Assistant

## 2016-06-12 DIAGNOSIS — K080 Exfoliation of teeth due to systemic causes: Secondary | ICD-10-CM | POA: Diagnosis not present

## 2016-06-17 ENCOUNTER — Encounter: Payer: Self-pay | Admitting: Physician Assistant

## 2016-06-17 DIAGNOSIS — K080 Exfoliation of teeth due to systemic causes: Secondary | ICD-10-CM | POA: Diagnosis not present

## 2016-06-29 DIAGNOSIS — E038 Other specified hypothyroidism: Secondary | ICD-10-CM | POA: Diagnosis not present

## 2016-06-29 DIAGNOSIS — E2749 Other adrenocortical insufficiency: Secondary | ICD-10-CM | POA: Diagnosis not present

## 2016-06-29 DIAGNOSIS — E2839 Other primary ovarian failure: Secondary | ICD-10-CM | POA: Diagnosis not present

## 2016-06-29 DIAGNOSIS — E663 Overweight: Secondary | ICD-10-CM | POA: Diagnosis not present

## 2016-07-22 DIAGNOSIS — N819 Female genital prolapse, unspecified: Secondary | ICD-10-CM | POA: Diagnosis not present

## 2016-07-22 DIAGNOSIS — N8110 Cystocele, unspecified: Secondary | ICD-10-CM | POA: Diagnosis not present

## 2016-07-24 DIAGNOSIS — Z1231 Encounter for screening mammogram for malignant neoplasm of breast: Secondary | ICD-10-CM | POA: Diagnosis not present

## 2016-07-24 LAB — HM MAMMOGRAPHY

## 2016-10-01 DIAGNOSIS — K080 Exfoliation of teeth due to systemic causes: Secondary | ICD-10-CM | POA: Diagnosis not present

## 2016-11-25 DIAGNOSIS — N898 Other specified noninflammatory disorders of vagina: Secondary | ICD-10-CM | POA: Diagnosis not present

## 2016-11-25 DIAGNOSIS — Z113 Encounter for screening for infections with a predominantly sexual mode of transmission: Secondary | ICD-10-CM | POA: Diagnosis not present

## 2016-11-25 DIAGNOSIS — A599 Trichomoniasis, unspecified: Secondary | ICD-10-CM | POA: Diagnosis not present

## 2016-12-07 DIAGNOSIS — N898 Other specified noninflammatory disorders of vagina: Secondary | ICD-10-CM | POA: Diagnosis not present

## 2016-12-07 DIAGNOSIS — M545 Low back pain: Secondary | ICD-10-CM | POA: Diagnosis not present

## 2016-12-07 DIAGNOSIS — R319 Hematuria, unspecified: Secondary | ICD-10-CM | POA: Diagnosis not present

## 2016-12-07 DIAGNOSIS — N8110 Cystocele, unspecified: Secondary | ICD-10-CM | POA: Diagnosis not present

## 2016-12-07 DIAGNOSIS — N8189 Other female genital prolapse: Secondary | ICD-10-CM | POA: Diagnosis not present

## 2016-12-08 DIAGNOSIS — K080 Exfoliation of teeth due to systemic causes: Secondary | ICD-10-CM | POA: Diagnosis not present

## 2016-12-15 DIAGNOSIS — M1712 Unilateral primary osteoarthritis, left knee: Secondary | ICD-10-CM | POA: Diagnosis not present

## 2016-12-16 ENCOUNTER — Ambulatory Visit (INDEPENDENT_AMBULATORY_CARE_PROVIDER_SITE_OTHER): Payer: Federal, State, Local not specified - PPO | Admitting: Podiatry

## 2016-12-16 ENCOUNTER — Other Ambulatory Visit: Payer: Self-pay | Admitting: Podiatry

## 2016-12-16 ENCOUNTER — Encounter: Payer: Self-pay | Admitting: Podiatry

## 2016-12-16 ENCOUNTER — Ambulatory Visit (INDEPENDENT_AMBULATORY_CARE_PROVIDER_SITE_OTHER): Payer: Federal, State, Local not specified - PPO

## 2016-12-16 ENCOUNTER — Ambulatory Visit: Payer: Federal, State, Local not specified - PPO

## 2016-12-16 VITALS — BP 140/84 | HR 79

## 2016-12-16 DIAGNOSIS — M21619 Bunion of unspecified foot: Secondary | ICD-10-CM

## 2016-12-16 DIAGNOSIS — M79672 Pain in left foot: Secondary | ICD-10-CM

## 2016-12-16 DIAGNOSIS — G629 Polyneuropathy, unspecified: Secondary | ICD-10-CM | POA: Diagnosis not present

## 2016-12-16 NOTE — Progress Notes (Signed)
   Subjective:    Patient ID: Natalie Vazquez, female    DOB: 1960/08/05, 56 y.o.   MRN: [id number]  HPI  Chief Complaint  Patient presents with  . Foot Pain    B/L       Review of Systems  Constitutional: Positive for activity change and appetite change.  HENT: Positive for ear pain and tinnitus.   Eyes: Positive for redness and itching.  Cardiovascular: Positive for leg swelling.  Gastrointestinal: Positive for constipation.  Genitourinary: Positive for urgency.  Musculoskeletal: Positive for back pain and gait problem.  Allergic/Immunologic: Positive for food allergies.  Neurological: Positive for weakness and headaches.  Hematological:       Slow to heal       Objective:   Physical Exam        Assessment & Plan:

## 2016-12-17 NOTE — Progress Notes (Signed)
Subjective:    Patient ID: Natalie Vazquez, female   DOB: 56 y.o.   MRN: [id number]   HPI patient presents stating she's just had pain in both feet and his history of bunion deformity    Review of Systems  All other systems reviewed and are negative.       Objective:  Physical Exam  Constitutional: She appears well-developed and well-nourished.  Cardiovascular: Intact distal pulses.   Pulmonary/Chest: Effort normal.  Musculoskeletal: Normal range of motion.  Neurological: She is alert.  Skin: Skin is warm.  Nursing note and vitals reviewed.  neurovascular status intact muscle strength adequate range of motion within normal limits with patient noted to have mild structural bunion deformity bilateral moderate discomfort in the left over right plantar arch that is nondescript in nature with no specific area pain. Found have good digital perfusion well oriented 3     Assessment:   Inflammatory changes with tendinitis-like symptomatology and mild structural bunion deformity bilateral      Plan:   H&P conditions reviewed and discussed surgical correction currently. At this point I do not recommend surgery recommend better shoes oral anti-inflammatories and soaks and if symptoms persist will reappoint to reevaluate  X-rays indicate that there is mild structural bunion deformity with no indication of other pathology

## 2016-12-23 DIAGNOSIS — N8189 Other female genital prolapse: Secondary | ICD-10-CM | POA: Diagnosis not present

## 2016-12-23 DIAGNOSIS — Z124 Encounter for screening for malignant neoplasm of cervix: Secondary | ICD-10-CM | POA: Diagnosis not present

## 2016-12-23 DIAGNOSIS — Z6837 Body mass index (BMI) 37.0-37.9, adult: Secondary | ICD-10-CM | POA: Diagnosis not present

## 2016-12-23 DIAGNOSIS — Z01419 Encounter for gynecological examination (general) (routine) without abnormal findings: Secondary | ICD-10-CM | POA: Diagnosis not present

## 2016-12-23 LAB — HM PAP SMEAR: HM Pap smear: NEGATIVE

## 2017-01-15 DIAGNOSIS — H9313 Tinnitus, bilateral: Secondary | ICD-10-CM | POA: Diagnosis not present

## 2017-01-15 DIAGNOSIS — M26623 Arthralgia of bilateral temporomandibular joint: Secondary | ICD-10-CM | POA: Diagnosis not present

## 2017-01-15 DIAGNOSIS — H6983 Other specified disorders of Eustachian tube, bilateral: Secondary | ICD-10-CM | POA: Diagnosis not present

## 2017-01-15 DIAGNOSIS — H833X3 Noise effects on inner ear, bilateral: Secondary | ICD-10-CM | POA: Diagnosis not present

## 2017-01-17 DIAGNOSIS — H833X3 Noise effects on inner ear, bilateral: Secondary | ICD-10-CM | POA: Insufficient documentation

## 2017-01-21 ENCOUNTER — Encounter: Payer: Self-pay | Admitting: Family Medicine

## 2017-01-21 ENCOUNTER — Ambulatory Visit (HOSPITAL_BASED_OUTPATIENT_CLINIC_OR_DEPARTMENT_OTHER)
Admission: RE | Admit: 2017-01-21 | Discharge: 2017-01-21 | Disposition: A | Discharge status: Home / Self Care | Payer: Federal, State, Local not specified - PPO | Source: Ambulatory Visit | Attending: Family Medicine | Admitting: Family Medicine

## 2017-01-21 ENCOUNTER — Ambulatory Visit (INDEPENDENT_AMBULATORY_CARE_PROVIDER_SITE_OTHER): Payer: Federal, State, Local not specified - PPO | Admitting: Family Medicine

## 2017-01-21 VITALS — BP 122/82 | HR 70 | Temp 98.3°F | Ht 67.0 in | Wt 237.0 lb

## 2017-01-21 DIAGNOSIS — Z5181 Encounter for therapeutic drug level monitoring: Secondary | ICD-10-CM

## 2017-01-21 DIAGNOSIS — M10071 Idiopathic gout, right ankle and foot: Secondary | ICD-10-CM

## 2017-01-21 DIAGNOSIS — M19071 Primary osteoarthritis, right ankle and foot: Secondary | ICD-10-CM | POA: Diagnosis not present

## 2017-01-21 DIAGNOSIS — Z8639 Personal history of other endocrine, nutritional and metabolic disease: Secondary | ICD-10-CM

## 2017-01-21 MED ORDER — COLCHICINE 0.6 MG PO TABS: ORAL_TABLET | ORAL | 0 refills | Status: DC

## 2017-01-21 NOTE — Patient Instructions (Addendum)
I suspect that you have gout in your foot.  We will get an x-ray for you today to make sure we don't see anything else amiss.  Go ahead and use the colchicine- 2 pills once, then 1 pill an hour later. If need be you can take 1 pill a day for a few days thereafter.  Use ibuprofen as needed at home.  Ice and elevation may also be helpful  Please let me know if your foot does not feel better soon!  You might eat less protein for a few days while your foot calms down   We will also check your thyroid and kidney function today

## 2017-01-21 NOTE — Progress Notes (Addendum)
[place] [facility] [address], [place], [place] [address] [phone]  Date:  01/21/2017   Name:  Natalie Vazquez   DOB:  Aug 22, 1960   MRN:  [id number]  PCP:  Brunetta Jeans, PA-C    Chief Complaint: Foot Pain (Right foot pain and swelling. Onset 5 days ago )   History of Present Illness:  Natalie Vazquez is a 56 y.o. very pleasant female patient who presents with the following:  She has noted an issue with her right foot This past Sunday (today is Thursday) she was walking and felt a "pinch" in her foot. She did not have any particular injury, but over the last few days she has noted persistent/ worsening pain over the lateral dorsal foot  She is wearing compression socks which have helped   She is not a beer drinker, did not eat shellfish. She is doing a low carb diete and has been eating more protein than normal recetnly So far she has not used any medications for her foot - she does have some ibuprofen at home  No fever or chills She otherwise feels well No other joint pains Never had this in the past  She is not aware of any family history of gout but her MIL does have this   No rash No skin changes  Patient Active Problem List   Diagnosis Date Noted  . Migraines 09/28/2013  . Visit for preventive health examination 09/10/2013  . GERD (gastroesophageal reflux disease) 03/19/2013  . Seasonal allergies 03/19/2013  . Neck pain, chronic 03/19/2013  . IBS (irritable bowel syndrome) 03/19/2013  . Fracture, metacarpal shaft 01/22/2012    Past Medical History:  Diagnosis Date  . Allergic rhinitis due to other allergen   . Allergy    seasonal  . Anemia   . Arthritis   . Asthma    mild, intermittent  . Chicken pox   . Cholelithiasis   . CTS (carpal tunnel syndrome)   . Fibroids   . Fibromyalgia   . Frequent urination   . GERD (gastroesophageal reflux disease)   . Headache(784.0)   . Migraine   .  PONV (postoperative nausea and vomiting)    difficulty waking up  . Problems related to high-risk sexual behavior    Unprotected sex  . Symptomatic menopausal or female climacteric states     Past Surgical History:  Procedure Laterality Date  . ABDOMINAL HYSTERECTOMY  2008   partial, ovaries remain  . CARPAL TUNNEL RELEASE Right 2015  . CHOLECYSTECTOMY  05/19/2011   Procedure: LAPAROSCOPIC CHOLECYSTECTOMY;  Surgeon: Zenovia Jarred, MD;  Location: [place];  Service: General;  Laterality: N/A;  . LASER ABLATION [place] / VULVAR    . PERCUTANEOUS PINNING  01/22/2012   Procedure: PERCUTANEOUS PINNING EXTREMITY;  Surgeon: Marybelle Killings, MD;  Location: [facility];  Service: Orthopedics;  Laterality: Right;  Closed Reduction and Pinning right 5th Metacarpal Fracture  . TUBAL LIGATION    . WISDOM TOOTH EXTRACTION      Social History  Substance Use Topics  . Smoking status: Never Smoker  . Smokeless tobacco: Never Used  . Alcohol use Yes     Comment: rarely     Family History  Problem Relation Age of Onset  . Diabetes Mother        Deceased  . Hypertension Mother   . Anemia Mother   . Hyperlipidemia Mother   .  Arthritis/Rheumatoid Mother   . Migraines Mother   . CVA Mother   . Hypertension Father        Deceased  . Stroke Father   . Diabetes Father        Legs Amputated  . Heart disease Father   . Arthritis Father   . Cancer Father   . Heart attack Maternal Aunt   . Diabetes Maternal Uncle   . Migraines Maternal Aunt   . Asthma Maternal Aunt   . Asthma Maternal Uncle   . Stomach cancer Sister   . Breast cancer Sister   . Uterine cancer Sister        or Ovarian  . Sleep apnea Brother   . COPD Brother   . Asthma Sister        x2  . Sudden death Brother        SIDS  . Hypertension Brother   . Diabetes Sister   . Asthma Daughter        x2  . Allergies Daughter   . Sickle cell trait Other        All Children  . Atrial fibrillation Daughter   . Anxiety  disorder Daughter   . Allergies Son   . Hypertension Sister   . Heart disease Sister   . Asthma Brother     Allergies  Allergen Reactions  . Iodine Anaphylaxis  . Shellfish Allergy Anaphylaxis  . Latex Rash    Medication list has been reviewed and updated.  Current Outpatient Prescriptions on File Prior to Visit  Medication Sig Dispense Refill  . Biotin 1000 MCG tablet Take 1,000 mcg by mouth daily.    . cetirizine (ZYRTEC) 10 MG tablet TAKE 1/2 TABLET BY MOUTH EVERY DAY (Patient taking differently: TAKE ONE TABLET BY MOUTH EVERY DAY) 30 tablet 5  . cyclobenzaprine (FLEXERIL) 10 MG tablet cyclobenzaprine 10 mg tablet    . esomeprazole (NEXIUM) 40 MG capsule TAKE 1 CAPSULE BY MOUTH EVERY MORNING BEFORE BREAKFAST 90 capsule 1  . levalbuterol (XOPENEX HFA) 45 MCG/ACT inhaler INHALE 1 TO 2 PUFFS INTO THE LUNGS EVERY 6 HOURS AS NEEDED FOR WHEEZING 45 g 3  . liothyronine (CYTOMEL) 5 MCG tablet liothyronine 5 mcg tablet  TK 1/2 T PO QD IN THE MORNING 30 MIN B FOOD    . Multiple Vitamins-Minerals (MULTIVITAMIN WITH MINERALS) tablet Take 1 tablet by mouth daily. Daily MVI w/Iron; Alternate w/Prenatal MVI    . NON FORMULARY Take 3 tablets by mouth as needed. Swiss Kriss Herbal Laxative Tab    . vitamin E 400 UNIT capsule Take 400 Units by mouth daily.    Marland Kitchen zonisamide (ZONEGRAN) 25 MG capsule Take 1 capsule by mouth daily.  5  . [DISCONTINUED] phentermine 37.5 MG capsule Take 37.5 mg by mouth every morning.     No current facility-administered medications on file prior to visit.     Review of Systems:  As per HPI- otherwise negative.   Physical Examination: Vitals:   01/21/17 1426  BP: (!) 160/90  Pulse: 70  Temp: 98.3 F (36.8 C)  SpO2: 98%   Vitals:   01/21/17 1426  Weight: 237 lb (107.5 kg)  Height: 5\' 7"  (1.702 m)   Body mass index is 37.12 kg/m. Ideal Body Weight: Weight in (lb) to have BMI = 25: 159.3  GEN: WDWN, NAD, Non-toxic, A & O x 3, overweight, looks  well HEENT: Atraumatic, Normocephalic. Neck supple. No masses, No LAD. Ears and Nose: No external deformity.  CV: RRR, No M/G/R. No JVD. No thrill. No extra heart sounds. PULM: CTA B, no wheezes, crackles, rhonchi. No retractions. No resp. distress. No accessory muscle use. EXTR: No c/c/e NEURO Normal gait.  PSYCH: Normally interactive. Conversant. Not depressed or anxious appearing.  Calm demeanor.  Right foot: ankle is negative.  Toe joints are non- tender.  She has tenderness over the dorsal foot at the 4th MTP joint- it is also slightly warm   Assessment and Plan: Acute idiopathic gout involving toe of right foot - Plan: colchicine 0.6 MG tablet, DG Foot Complete Right  Medication monitoring encounter - Plan: Basic metabolic panel  History of thyroid disorder - Plan: TSH  Suspect gout of the right foot. Will have her use colchicine, and also NSAIDs as needed We note that she has a thyroid med on her list., but she is not taking this. She is not quite sure if she was ever dx with a thyroid disorder.  In any case we will check her thyroid today and also a BMP  She will let me know if her foot is not better in the next couple of days- Sooner if worse.  Will obtain plain film of foot-   Dg Foot Complete Right  Result Date: 01/21/2017 CLINICAL DATA:  Pain and swelling dorsal right foot since 01/17/2017. No injury. EXAM: RIGHT FOOT COMPLETE - 3+ VIEW COMPARISON:  12/16/2016 FINDINGS: Minimal degenerative change over the first MTP joint. No evidence of fracture or dislocation. IMPRESSION: No acute findings. Minimal degenerate change at the first MTP joint. Electronically Signed   By: Marin Olp M.D.   On: 01/21/2017 15:34   Message to pt- x-rays is negative  Signed Lamar Blinks, MD  Received her labs- message to pt 10/12  Results for orders placed or performed in visit on 02/63/78  Basic metabolic panel  Result Value Ref Range   Sodium 138 135 - 145 mEq/L   Potassium 4.2 3.5  - 5.1 mEq/L   Chloride 104 96 - 112 mEq/L   CO2 24 19 - 32 mEq/L   Glucose, Bld 64 (L) 70 - 99 mg/dL   BUN 10 6 - 23 mg/dL   Creatinine, Ser 0.68 0.40 - 1.20 mg/dL   Calcium 8.7 8.4 - 10.5 mg/dL   GFR 115.70 >60.00 mL/min  TSH  Result Value Ref Range   TSH 0.89 0.35 - 4.50 uIU/mL

## 2017-01-22 ENCOUNTER — Encounter: Payer: Self-pay | Admitting: Family Medicine

## 2017-01-22 LAB — BASIC METABOLIC PANEL
BUN: 10 mg/dL (ref 6–23)
CALCIUM: 8.7 mg/dL (ref 8.4–10.5)
CHLORIDE: 104 meq/L (ref 96–112)
CO2: 24 meq/L (ref 19–32)
CREATININE: 0.68 mg/dL (ref 0.40–1.20)
GFR: 115.7 mL/min (ref >60.00)
GLUCOSE: 64 mg/dL — AB (ref 70–99)
Potassium: 4.2 mEq/L (ref 3.5–5.1)
Sodium: 138 mEq/L (ref 135–145)

## 2017-01-22 LAB — TSH: TSH: 0.89 u[IU]/mL (ref 0.35–4.50)

## 2017-01-25 ENCOUNTER — Ambulatory Visit: Payer: Federal, State, Local not specified - PPO | Admitting: Podiatry

## 2017-01-28 ENCOUNTER — Ambulatory Visit (INDEPENDENT_AMBULATORY_CARE_PROVIDER_SITE_OTHER): Payer: Federal, State, Local not specified - PPO | Admitting: Podiatry

## 2017-01-28 ENCOUNTER — Encounter: Payer: Self-pay | Admitting: Podiatry

## 2017-01-28 ENCOUNTER — Other Ambulatory Visit: Payer: Self-pay | Admitting: Podiatry

## 2017-01-28 ENCOUNTER — Ambulatory Visit (INDEPENDENT_AMBULATORY_CARE_PROVIDER_SITE_OTHER): Payer: Federal, State, Local not specified - PPO

## 2017-01-28 DIAGNOSIS — R51 Headache: Secondary | ICD-10-CM | POA: Diagnosis not present

## 2017-01-28 DIAGNOSIS — M5412 Radiculopathy, cervical region: Secondary | ICD-10-CM | POA: Diagnosis not present

## 2017-01-28 DIAGNOSIS — M79671 Pain in right foot: Secondary | ICD-10-CM

## 2017-01-28 DIAGNOSIS — M67479 Ganglion, unspecified ankle and foot: Secondary | ICD-10-CM

## 2017-01-28 DIAGNOSIS — M62838 Other muscle spasm: Secondary | ICD-10-CM | POA: Diagnosis not present

## 2017-01-28 DIAGNOSIS — M6740 Ganglion, unspecified site: Secondary | ICD-10-CM

## 2017-01-28 DIAGNOSIS — M9901 Segmental and somatic dysfunction of cervical region: Secondary | ICD-10-CM | POA: Diagnosis not present

## 2017-02-01 NOTE — Progress Notes (Signed)
  Subjective:  Patient ID: Natalie Vazquez, female    DOB: 31-May-1960,  MRN: [id number]  Chief Complaint  Patient presents with  . Cyst    Right dorsal of foot x 1 week.    56 y.o. female returns for the above complaint. Reports cyst that has been present to the top of R foot for 1 week. Endorses pain due to pressure of the cyst.  Objective:  There were no vitals filed for this visit. General AA&O x3. Normal mood and affect.  Vascular Pedal pulses palpable.  Neurologic Epicritic sensation grossly intact.  Dermatologic No open lesions. Skin normal texture and turgor. R dorsolateral midfoot cyst noted with fluctuance.  Orthopedic: Pain to palpation R midfoot cyst.   Assessment & Plan:  Patient was evaluated and treated and all questions answered.  Ganglion Cyst -Aspiration performed with scant return of gelatinous material. See below. -Discussed likely recurrence. -Will consider removal if recurs.  Procedure: Aspiration Ganglion Cyst Anesthesia: Lidocaine 1% plain; 27mL Instrumentation: 18g needle Technique: 18g needle aspiration. Minimal return of gelatinous material Dressing: Dry, sterile, compression dressing. Disposition: Patient tolerated procedure well. Patient to return as needed.

## 2017-02-05 ENCOUNTER — Other Ambulatory Visit: Payer: Self-pay | Admitting: Family Medicine

## 2017-02-05 DIAGNOSIS — M62838 Other muscle spasm: Secondary | ICD-10-CM | POA: Diagnosis not present

## 2017-02-05 DIAGNOSIS — M5412 Radiculopathy, cervical region: Secondary | ICD-10-CM | POA: Diagnosis not present

## 2017-02-05 DIAGNOSIS — R51 Headache: Secondary | ICD-10-CM | POA: Diagnosis not present

## 2017-02-05 DIAGNOSIS — M9901 Segmental and somatic dysfunction of cervical region: Secondary | ICD-10-CM | POA: Diagnosis not present

## 2017-02-10 DIAGNOSIS — M9901 Segmental and somatic dysfunction of cervical region: Secondary | ICD-10-CM | POA: Diagnosis not present

## 2017-02-10 DIAGNOSIS — M62838 Other muscle spasm: Secondary | ICD-10-CM | POA: Diagnosis not present

## 2017-02-10 DIAGNOSIS — R51 Headache: Secondary | ICD-10-CM | POA: Diagnosis not present

## 2017-02-10 DIAGNOSIS — M5412 Radiculopathy, cervical region: Secondary | ICD-10-CM | POA: Diagnosis not present

## 2017-02-11 DIAGNOSIS — R102 Pelvic and perineal pain: Secondary | ICD-10-CM | POA: Diagnosis not present

## 2017-02-11 DIAGNOSIS — N819 Female genital prolapse, unspecified: Secondary | ICD-10-CM | POA: Diagnosis not present

## 2017-02-11 DIAGNOSIS — N393 Stress incontinence (female) (male): Secondary | ICD-10-CM | POA: Diagnosis not present

## 2017-02-17 ENCOUNTER — Ambulatory Visit: Payer: Federal, State, Local not specified - PPO | Admitting: Podiatry

## 2017-02-17 DIAGNOSIS — N3941 Urge incontinence: Secondary | ICD-10-CM | POA: Diagnosis not present

## 2017-02-17 DIAGNOSIS — R350 Frequency of micturition: Secondary | ICD-10-CM | POA: Diagnosis not present

## 2017-02-17 DIAGNOSIS — R3915 Urgency of urination: Secondary | ICD-10-CM | POA: Diagnosis not present

## 2017-02-17 DIAGNOSIS — N393 Stress incontinence (female) (male): Secondary | ICD-10-CM | POA: Diagnosis not present

## 2017-03-02 DIAGNOSIS — M5412 Radiculopathy, cervical region: Secondary | ICD-10-CM | POA: Diagnosis not present

## 2017-03-02 DIAGNOSIS — M62838 Other muscle spasm: Secondary | ICD-10-CM | POA: Diagnosis not present

## 2017-03-02 DIAGNOSIS — M9901 Segmental and somatic dysfunction of cervical region: Secondary | ICD-10-CM | POA: Diagnosis not present

## 2017-03-02 DIAGNOSIS — R51 Headache: Secondary | ICD-10-CM | POA: Diagnosis not present

## 2017-03-10 DIAGNOSIS — R3915 Urgency of urination: Secondary | ICD-10-CM | POA: Diagnosis not present

## 2017-03-10 DIAGNOSIS — N393 Stress incontinence (female) (male): Secondary | ICD-10-CM | POA: Diagnosis not present

## 2017-03-10 DIAGNOSIS — R351 Nocturia: Secondary | ICD-10-CM | POA: Diagnosis not present

## 2017-03-10 DIAGNOSIS — N3941 Urge incontinence: Secondary | ICD-10-CM | POA: Diagnosis not present

## 2017-03-12 DIAGNOSIS — M62838 Other muscle spasm: Secondary | ICD-10-CM | POA: Diagnosis not present

## 2017-03-12 DIAGNOSIS — M9901 Segmental and somatic dysfunction of cervical region: Secondary | ICD-10-CM | POA: Diagnosis not present

## 2017-03-12 DIAGNOSIS — R51 Headache: Secondary | ICD-10-CM | POA: Diagnosis not present

## 2017-03-12 DIAGNOSIS — M5412 Radiculopathy, cervical region: Secondary | ICD-10-CM | POA: Diagnosis not present

## 2017-03-17 DIAGNOSIS — R350 Frequency of micturition: Secondary | ICD-10-CM | POA: Diagnosis not present

## 2017-03-17 DIAGNOSIS — R3915 Urgency of urination: Secondary | ICD-10-CM | POA: Diagnosis not present

## 2017-03-17 DIAGNOSIS — N3941 Urge incontinence: Secondary | ICD-10-CM | POA: Diagnosis not present

## 2017-03-17 DIAGNOSIS — N393 Stress incontinence (female) (male): Secondary | ICD-10-CM | POA: Diagnosis not present

## 2017-03-19 DIAGNOSIS — M5412 Radiculopathy, cervical region: Secondary | ICD-10-CM | POA: Diagnosis not present

## 2017-03-19 DIAGNOSIS — M9901 Segmental and somatic dysfunction of cervical region: Secondary | ICD-10-CM | POA: Diagnosis not present

## 2017-03-19 DIAGNOSIS — R51 Headache: Secondary | ICD-10-CM | POA: Diagnosis not present

## 2017-03-19 DIAGNOSIS — M62838 Other muscle spasm: Secondary | ICD-10-CM | POA: Diagnosis not present

## 2017-03-24 DIAGNOSIS — R339 Retention of urine, unspecified: Secondary | ICD-10-CM | POA: Diagnosis not present

## 2017-03-24 DIAGNOSIS — R350 Frequency of micturition: Secondary | ICD-10-CM | POA: Diagnosis not present

## 2017-03-24 DIAGNOSIS — N3941 Urge incontinence: Secondary | ICD-10-CM | POA: Diagnosis not present

## 2017-03-24 DIAGNOSIS — N393 Stress incontinence (female) (male): Secondary | ICD-10-CM | POA: Diagnosis not present

## 2017-03-26 DIAGNOSIS — M62838 Other muscle spasm: Secondary | ICD-10-CM | POA: Diagnosis not present

## 2017-03-26 DIAGNOSIS — R51 Headache: Secondary | ICD-10-CM | POA: Diagnosis not present

## 2017-03-26 DIAGNOSIS — M5412 Radiculopathy, cervical region: Secondary | ICD-10-CM | POA: Diagnosis not present

## 2017-03-26 DIAGNOSIS — M9901 Segmental and somatic dysfunction of cervical region: Secondary | ICD-10-CM | POA: Diagnosis not present

## 2017-03-31 DIAGNOSIS — N393 Stress incontinence (female) (male): Secondary | ICD-10-CM | POA: Diagnosis not present

## 2017-03-31 DIAGNOSIS — N3941 Urge incontinence: Secondary | ICD-10-CM | POA: Diagnosis not present

## 2017-03-31 DIAGNOSIS — R3915 Urgency of urination: Secondary | ICD-10-CM | POA: Diagnosis not present

## 2017-03-31 DIAGNOSIS — R350 Frequency of micturition: Secondary | ICD-10-CM | POA: Diagnosis not present

## 2017-04-02 DIAGNOSIS — M62838 Other muscle spasm: Secondary | ICD-10-CM | POA: Diagnosis not present

## 2017-04-02 DIAGNOSIS — R51 Headache: Secondary | ICD-10-CM | POA: Diagnosis not present

## 2017-04-02 DIAGNOSIS — M9901 Segmental and somatic dysfunction of cervical region: Secondary | ICD-10-CM | POA: Diagnosis not present

## 2017-04-02 DIAGNOSIS — M5412 Radiculopathy, cervical region: Secondary | ICD-10-CM | POA: Diagnosis not present

## 2017-04-05 ENCOUNTER — Other Ambulatory Visit: Payer: Self-pay | Admitting: Medical

## 2017-04-05 ENCOUNTER — Other Ambulatory Visit: Payer: Self-pay | Admitting: Physician Assistant

## 2017-04-05 ENCOUNTER — Other Ambulatory Visit: Payer: Self-pay | Admitting: Family Medicine

## 2017-04-05 DIAGNOSIS — M10071 Idiopathic gout, right ankle and foot: Secondary | ICD-10-CM

## 2017-04-07 DIAGNOSIS — R350 Frequency of micturition: Secondary | ICD-10-CM | POA: Diagnosis not present

## 2017-04-07 DIAGNOSIS — R3915 Urgency of urination: Secondary | ICD-10-CM | POA: Diagnosis not present

## 2017-04-07 DIAGNOSIS — N3941 Urge incontinence: Secondary | ICD-10-CM | POA: Diagnosis not present

## 2017-04-07 DIAGNOSIS — N393 Stress incontinence (female) (male): Secondary | ICD-10-CM | POA: Diagnosis not present

## 2017-04-14 DIAGNOSIS — R3915 Urgency of urination: Secondary | ICD-10-CM | POA: Diagnosis not present

## 2017-04-14 DIAGNOSIS — N393 Stress incontinence (female) (male): Secondary | ICD-10-CM | POA: Diagnosis not present

## 2017-04-14 DIAGNOSIS — R350 Frequency of micturition: Secondary | ICD-10-CM | POA: Diagnosis not present

## 2017-04-14 DIAGNOSIS — N3941 Urge incontinence: Secondary | ICD-10-CM | POA: Diagnosis not present

## 2017-04-15 DIAGNOSIS — R51 Headache: Secondary | ICD-10-CM | POA: Diagnosis not present

## 2017-04-15 DIAGNOSIS — M62838 Other muscle spasm: Secondary | ICD-10-CM | POA: Diagnosis not present

## 2017-04-15 DIAGNOSIS — M9901 Segmental and somatic dysfunction of cervical region: Secondary | ICD-10-CM | POA: Diagnosis not present

## 2017-04-15 DIAGNOSIS — M5412 Radiculopathy, cervical region: Secondary | ICD-10-CM | POA: Diagnosis not present

## 2017-04-21 DIAGNOSIS — R350 Frequency of micturition: Secondary | ICD-10-CM | POA: Diagnosis not present

## 2017-04-21 DIAGNOSIS — R3915 Urgency of urination: Secondary | ICD-10-CM | POA: Diagnosis not present

## 2017-04-21 DIAGNOSIS — N393 Stress incontinence (female) (male): Secondary | ICD-10-CM | POA: Diagnosis not present

## 2017-04-21 DIAGNOSIS — N3941 Urge incontinence: Secondary | ICD-10-CM | POA: Diagnosis not present

## 2017-04-29 DIAGNOSIS — M5412 Radiculopathy, cervical region: Secondary | ICD-10-CM | POA: Diagnosis not present

## 2017-04-29 DIAGNOSIS — M62838 Other muscle spasm: Secondary | ICD-10-CM | POA: Diagnosis not present

## 2017-04-29 DIAGNOSIS — M9901 Segmental and somatic dysfunction of cervical region: Secondary | ICD-10-CM | POA: Diagnosis not present

## 2017-04-29 DIAGNOSIS — R51 Headache: Secondary | ICD-10-CM | POA: Diagnosis not present

## 2017-05-05 ENCOUNTER — Other Ambulatory Visit: Payer: Self-pay | Admitting: Physician Assistant

## 2017-05-06 ENCOUNTER — Other Ambulatory Visit: Payer: Self-pay | Admitting: Physician Assistant

## 2017-05-06 ENCOUNTER — Ambulatory Visit: Payer: Self-pay | Admitting: *Deleted

## 2017-05-06 NOTE — Telephone Encounter (Signed)
Called in saying she had indentions in her left wrist from where she had elastic pearl/bead bracelets on earlier.   She put them on about 8:00 this morning and removed them at 11:45.   The indentions are still in her wrist though they are starting to go away.  She denied any pain, limited movement, numbness/tingling in her left arm.    She did mention she sleeps on her left side.   I went over some home care advice with her.  I instructed her to call us back if after a day or two her arm is no better, gets worse or it just doesn't feel right.   She verbalized understanding.  Reason for Disposition . Arm pain  Answer Assessment - Initial Assessment Questions 1. ONSET: "When did the pain start?"     No pain.  I had some elastic bracelets on  My left arm at my wrist.  I put them on at 8:00 am and removed at 11:45 am.   The indention's are still in my arm from the bracelets. 2. LOCATION: "Where is the pain located?"     It just feels different.  It's not painful.  My wrist  Is darker at the wrist where the bracelets were.   They are elastic bead bracelets.   Not metallic. 3. PAIN: "How bad is the pain?" (Scale 1-10; or mild, moderate, severe)   - MILD (1-3): doesn't interfere with normal activities   - MODERATE (4-7): interferes with normal activities (e.g., work or school) or awakens from sleep   - SEVERE (8-10): excruciating pain, unable to do any normal activities, unable to hold a cup of water     No limited movement.  Never been broken.  No accidents, falls. 4. WORK OR EXERCISE: "Has there been any recent work or exercise that involved this part of the body?"     Last week it hurt.  I usually sleep on that arm on the left. 5. CAUSE: "What do you think is causing the arm pain?"     I have no idea. 6. OTHER SYMPTOMS: "Do you have any other symptoms?" (e.g., neck pain, swelling, rash, fever, numbness, weakness)     I had carpal tunnel surgery in the past on my right hand.  I do have carpal tunnel  in my left wrist/hand but never had surgery.  This doesn't feel like carpal tunnel.   The pain I had last week is on top of my hand.  7. PREGNANCY: "Is there any chance you are pregnant?" "When was your last menstrual period?"     Not asked.  Protocols used: ARM PAIN-A-AH

## 2017-05-06 NOTE — Telephone Encounter (Signed)
Last OV 05/22/16 ( no upcoming appts) xopenex last filled 05/19/16 45g with 3

## 2017-05-07 ENCOUNTER — Telehealth: Payer: Self-pay | Admitting: Physician Assistant

## 2017-05-07 NOTE — Telephone Encounter (Signed)
Copied from [place] [id number]. Topic: Quick Communication - See Telephone Encounter >> May 07, 2017 12:29 PM Conception Chancy, NT wrote: CRM for notification. See Telephone encounter for:  05/07/17.  Pt states she is trying to get her constipation medicine refilled and she states she believes it is call Amitiza. I did not see on patient file. Please advise.  [facility].

## 2017-05-07 NOTE — Telephone Encounter (Signed)
Spoke with about her Amitiza. She was on Amitiza but her insurance didn't cover the medication. Switched to [employer]. Patient states the Linzess did not help with IBS symptoms.  Patient has the same [employer]. Will start prior authorization for Amitiza.  PA initiated by [place]

## 2017-05-10 DIAGNOSIS — R51 Headache: Secondary | ICD-10-CM | POA: Diagnosis not present

## 2017-05-10 DIAGNOSIS — M62838 Other muscle spasm: Secondary | ICD-10-CM | POA: Diagnosis not present

## 2017-05-10 DIAGNOSIS — M9901 Segmental and somatic dysfunction of cervical region: Secondary | ICD-10-CM | POA: Diagnosis not present

## 2017-05-10 DIAGNOSIS — M5412 Radiculopathy, cervical region: Secondary | ICD-10-CM | POA: Diagnosis not present

## 2017-05-10 NOTE — Telephone Encounter (Signed)
Prior Authorization approved 04/10/17-05/10/2018 Amitiza 8 mcg Faxed patient pharmacy of approval.

## 2017-05-27 DIAGNOSIS — M9901 Segmental and somatic dysfunction of cervical region: Secondary | ICD-10-CM | POA: Diagnosis not present

## 2017-05-27 DIAGNOSIS — M62838 Other muscle spasm: Secondary | ICD-10-CM | POA: Diagnosis not present

## 2017-05-27 DIAGNOSIS — R51 Headache: Secondary | ICD-10-CM | POA: Diagnosis not present

## 2017-05-27 DIAGNOSIS — M5412 Radiculopathy, cervical region: Secondary | ICD-10-CM | POA: Diagnosis not present

## 2017-05-27 NOTE — Progress Notes (Signed)
Patient presents to clinic today for annual exam.  Patient is fasting for labs.  Acute Concerns: Patient would like to visit the potential for a repeat sleep study. Patient had study before due to weight and daytime somnolence. Was told study was borderline. With increase in weight has noted worsening of fatigue and daytime somnolence despite adequate hours of sleep at night. Notes snoring. Unsure of apneic episodes presently as she lives alone.  Chronic Issues: IBS -- has done very well on Amitiza. Is currently running about 120.00 a month currently with insurance coverage. Has been on Linzess previously which did not work well. Is currently taking a Swiss Kriss supplement that helps some.   Health Maintenance: Immunizations -- up-to-date. Colonoscopy -- up-to-date. Mammogram -- Due. Has scheduled for March. Followed by [facility]. PAP -- s/p hysterectomy  Past Medical History:  Diagnosis Date  . Allergic rhinitis due to other allergen   . Allergy    seasonal  . Anemia   . Arthritis   . Asthma    mild, intermittent  . Chicken pox   . Cholelithiasis   . CTS (carpal tunnel syndrome)   . Fibroids   . Fibromyalgia   . Frequent urination   . GERD (gastroesophageal reflux disease)   . Headache(784.0)   . Migraine   . PONV (postoperative nausea and vomiting)    difficulty waking up  . Problems related to high-risk sexual behavior    Unprotected sex  . Symptomatic menopausal or female climacteric states     Past Surgical History:  Procedure Laterality Date  . ABDOMINAL HYSTERECTOMY  2008   partial, ovaries remain  . CARPAL TUNNEL RELEASE Right 2015  . CHOLECYSTECTOMY  05/19/2011   Procedure: LAPAROSCOPIC CHOLECYSTECTOMY;  Surgeon: Zenovia Jarred, MD;  Location: [place];  Service: General;  Laterality: N/A;  . LASER ABLATION [place] / VULVAR    . PERCUTANEOUS PINNING  01/22/2012   Procedure: PERCUTANEOUS PINNING EXTREMITY;  Surgeon: Marybelle Killings,  MD;  Location: [facility];  Service: Orthopedics;  Laterality: Right;  Closed Reduction and Pinning right 5th Metacarpal Fracture  . TUBAL LIGATION    . WISDOM TOOTH EXTRACTION      Current Outpatient Medications on File Prior to Visit  Medication Sig Dispense Refill  . Biotin 1000 MCG tablet Take 1,000 mcg by mouth daily.    . cetirizine (ZYRTEC) 10 MG tablet TAKE 1/2 TABLET BY MOUTH EVERY DAY 30 tablet 0  . cyclobenzaprine (FLEXERIL) 10 MG tablet cyclobenzaprine 10 mg tablet    . esomeprazole (NEXIUM) 40 MG capsule TAKE 1 CAPSULE BY MOUTH EVERY MORNING BEFORE BREAKFAST 90 capsule 1  . levalbuterol (XOPENEX HFA) 45 MCG/ACT inhaler INHALE 1 TO 2 PUFFS INTO THE LUNGS EVERY 6 HOURS AS NEEDED FOR WHEEZING 15 g 0  . lubiprostone (AMITIZA) 8 MCG capsule Take 1 capsule by mouth daily.    . Multiple Vitamins-Minerals (MULTIVITAMIN WITH MINERALS) tablet Take 1 tablet by mouth daily. Daily MVI w/Iron; Alternate w/Prenatal MVI    . NON FORMULARY Take 3 tablets by mouth as needed. Swiss Kriss Herbal Laxative Tab    . vitamin E 400 UNIT capsule Take 400 Units by mouth daily.    Marland Kitchen zonisamide (ZONEGRAN) 25 MG capsule Take 1 capsule by mouth daily.  5  . [DISCONTINUED] phentermine 37.5 MG capsule Take 37.5 mg by mouth every morning.     No current facility-administered medications on file prior to visit.     Allergies  Allergen Reactions  . Iodine Anaphylaxis  . Shellfish Allergy Anaphylaxis  . Latex Rash    Family History  Problem Relation Age of Onset  . Diabetes Mother        Deceased  . Hypertension Mother   . Anemia Mother   . Hyperlipidemia Mother   . Arthritis/Rheumatoid Mother   . Migraines Mother   . CVA Mother   . Hypertension Father        Deceased  . Stroke Father   . Diabetes Father        Legs Amputated  . Heart disease Father   . Arthritis Father   . Cancer Father   . Heart attack Maternal Aunt   . Diabetes Maternal Uncle   . Migraines Maternal Aunt   . Asthma Maternal  Aunt   . Asthma Maternal Uncle   . Stomach cancer Sister   . Breast cancer Sister   . Uterine cancer Sister        or Ovarian  . Sleep apnea Brother   . COPD Brother   . Asthma Sister        x2  . Sudden death Brother        SIDS  . Hypertension Brother   . Diabetes Sister   . Asthma Daughter        x2  . Allergies Daughter   . Sickle cell trait Other        All Children  . Atrial fibrillation Daughter   . Anxiety disorder Daughter   . Allergies Son   . Hypertension Sister   . Heart disease Sister   . Asthma Brother     Social History   Socioeconomic History  . Marital status: Single    Spouse name: Not on file  . Number of children: Not on file  . Years of education: Not on file  . Highest education level: Not on file  Social Needs  . Financial resource strain: Not on file  . Food insecurity - worry: Not on file  . Food insecurity - inability: Not on file  . Transportation needs - medical: Not on file  . Transportation needs - non-medical: Not on file  Occupational History  . Not on file  Tobacco Use  . Smoking status: Never Smoker  . Smokeless tobacco: Never Used  Substance and Sexual Activity  . Alcohol use: Yes    Comment: rarely   . Drug use: No  . Sexual activity: No    Birth control/protection: None, Abstinence, Surgical  Other Topics Concern  . Not on file  Social History Narrative  . Not on file   Review of Systems  Constitutional: Negative for fever and weight loss.  HENT: Negative for ear discharge, ear pain, hearing loss and tinnitus.   Eyes: Negative for blurred vision, double vision, photophobia and pain.  Respiratory: Negative for cough and shortness of breath.   Cardiovascular: Negative for chest pain and palpitations.  Gastrointestinal: Negative for abdominal pain, blood in stool, constipation, diarrhea, heartburn, melena, nausea and vomiting.  Genitourinary: Negative for dysuria, flank pain, frequency, hematuria and urgency.    Musculoskeletal: Negative for falls.  Neurological: Negative for dizziness, loss of consciousness and headaches.  Endo/Heme/Allergies: Negative for environmental allergies.  Psychiatric/Behavioral: Negative for depression, hallucinations, substance abuse and suicidal ideas. The patient is not nervous/anxious and does not have insomnia.     BP 124/80   Pulse (!) 59   Temp 98 F (36.7 C) (Oral)   Resp 16  Ht 5\' 7"  (1.702 m)   Wt 235 lb (106.6 kg)   SpO2 98%   BMI 36.81 kg/m   Physical Exam  Constitutional: She is oriented to person, place, and time and well-developed, well-nourished, and in no distress.  HENT:  Head: Normocephalic and atraumatic.  Right Ear: Tympanic membrane, external ear and ear canal normal.  Left Ear: Tympanic membrane, external ear and ear canal normal.  Nose: Nose normal. No mucosal edema.  Mouth/Throat: Uvula is midline, oropharynx is clear and moist and mucous membranes are normal. No oropharyngeal exudate or posterior oropharyngeal erythema.  Eyes: Conjunctivae are normal. Pupils are equal, round, and reactive to light.  Neck: Neck supple. No thyromegaly present.  Cardiovascular: Normal rate, regular rhythm, normal heart sounds and intact distal pulses.  Pulmonary/Chest: Effort normal and breath sounds normal. No respiratory distress. She has no wheezes. She has no rales.  Abdominal: Soft. Bowel sounds are normal. She exhibits no distension and no mass. There is no tenderness. There is no rebound and no guarding.  Lymphadenopathy:    She has no cervical adenopathy.  Neurological: She is alert and oriented to person, place, and time. No cranial nerve deficit.  Skin: Skin is warm and dry. No rash noted.  Psychiatric: Affect normal.  Vitals reviewed.  Assessment/Plan: IBS (irritable bowel syndrome) Voucher for Amitiza given to make more cost effective. Continue as directed.   OSA (obstructive sleep apnea) Do agree a repeat sleep study is warranting.  Will reach out to Pulmonology to get their input on if she could do a home sleep study or would need in-lab study due to borderline prior study. Dietary and exercise recommendations reviewed with patient.   Visit for preventive health examination Depression screen negative. Health Maintenance reviewed. Preventive schedule discussed and handout given in AVS. Will obtain fasting labs today.     Leeanne Rio, PA-C

## 2017-05-28 ENCOUNTER — Other Ambulatory Visit: Payer: Self-pay

## 2017-05-28 ENCOUNTER — Ambulatory Visit (INDEPENDENT_AMBULATORY_CARE_PROVIDER_SITE_OTHER): Payer: Federal, State, Local not specified - PPO | Admitting: Physician Assistant

## 2017-05-28 ENCOUNTER — Encounter: Payer: Self-pay | Admitting: Physician Assistant

## 2017-05-28 VITALS — BP 124/80 | HR 59 | Temp 98.0°F | Resp 16 | Ht 67.0 in | Wt 235.0 lb

## 2017-05-28 DIAGNOSIS — K581 Irritable bowel syndrome with constipation: Secondary | ICD-10-CM | POA: Diagnosis not present

## 2017-05-28 DIAGNOSIS — G4733 Obstructive sleep apnea (adult) (pediatric): Secondary | ICD-10-CM | POA: Diagnosis not present

## 2017-05-28 DIAGNOSIS — Z0000 Encounter for general adult medical examination without abnormal findings: Secondary | ICD-10-CM

## 2017-05-28 LAB — COMPREHENSIVE METABOLIC PANEL
ALK PHOS: 56 U/L (ref 39–117)
ALT: 12 U/L (ref 0–35)
AST: 13 U/L (ref 0–37)
Albumin: 4.4 g/dL (ref 3.5–5.2)
BUN: 8 mg/dL (ref 6–23)
CALCIUM: 9.5 mg/dL (ref 8.4–10.5)
CO2: 31 mEq/L (ref 19–32)
Chloride: 101 mEq/L (ref 96–112)
Creatinine, Ser: 0.77 mg/dL (ref 0.40–1.20)
GFR: 100.11 mL/min (ref >60.00)
GLUCOSE: 125 mg/dL — AB (ref 70–99)
POTASSIUM: 4.3 meq/L (ref 3.5–5.1)
Sodium: 137 mEq/L (ref 135–145)
Total Bilirubin: 0.3 mg/dL (ref 0.2–1.2)
Total Protein: 7.8 g/dL (ref 6.0–8.3)

## 2017-05-28 LAB — CBC WITH DIFFERENTIAL/PLATELET
Basophils Absolute: 0 10*3/uL (ref 0.0–0.1)
Basophils Relative: 0.4 % (ref 0.0–3.0)
EOS PCT: 2.2 % (ref 0.0–5.0)
Eosinophils Absolute: 0.1 10*3/uL (ref 0.0–0.7)
HCT: 39.4 % (ref 36.0–46.0)
Hemoglobin: 12.7 g/dL (ref 12.0–15.0)
LYMPHS ABS: 2.6 10*3/uL (ref 0.7–4.0)
Lymphocytes Relative: 47.8 % — ABNORMAL HIGH (ref 12.0–46.0)
MCHC: 32.2 g/dL (ref 30.0–36.0)
MCV: 77.4 fl — AB (ref 78.0–100.0)
Monocytes Absolute: 0.4 10*3/uL (ref 0.1–1.0)
Monocytes Relative: 8 % (ref 3.0–12.0)
NEUTROS ABS: 2.3 10*3/uL (ref 1.4–7.7)
NEUTROS PCT: 41.6 % — AB (ref 43.0–77.0)
PLATELETS: 257 10*3/uL (ref 150.0–400.0)
RBC: 5.1 Mil/uL (ref 3.87–5.11)
RDW: 15.2 % (ref 11.5–15.5)
WBC: 5.4 10*3/uL (ref 4.0–10.5)

## 2017-05-28 LAB — LIPID PANEL
CHOLESTEROL: 137 mg/dL (ref 0–200)
HDL: 53.6 mg/dL (ref >39.00)
LDL Cholesterol: 71 mg/dL (ref 0–99)
NONHDL: 83.79
TRIGLYCERIDES: 63 mg/dL (ref 0.0–149.0)
Total CHOL/HDL Ratio: 3
VLDL: 12.6 mg/dL (ref 0.0–40.0)

## 2017-05-28 LAB — VITAMIN D 25 HYDROXY (VIT D DEFICIENCY, FRACTURES): VITD: 19.84 ng/mL — ABNORMAL LOW (ref 30.00–100.00)

## 2017-05-28 LAB — TSH: TSH: 1.06 u[IU]/mL (ref 0.35–4.50)

## 2017-05-28 LAB — HEMOGLOBIN A1C: Hgb A1c MFr Bld: 6.5 % (ref 4.6–6.5)

## 2017-05-28 NOTE — Assessment & Plan Note (Signed)
Do agree a repeat sleep study is warranting. Will reach out to Pulmonology to get their input on if she could do a home sleep study or would need in-lab study due to borderline prior study. Dietary and exercise recommendations reviewed with patient.

## 2017-05-28 NOTE — Patient Instructions (Signed)
Please go to the lab for blood work.   Our office will call you with your results unless you have chosen to receive results via MyChart.  If your blood work is normal we will follow-up each year for physicals and as scheduled for chronic medical problems.  If anything is abnormal we will treat accordingly and get you in for a follow-up.  I will call you regarding sleep study once I speak with the specialist.     Preventive Care 40-64 Years, Female Preventive care refers to lifestyle choices and visits with your health care provider that can promote health and wellness. What does preventive care include?  A yearly physical exam. This is also called an annual well check.  Dental exams once or twice a year.  Routine eye exams. Ask your health care provider how often you should have your eyes checked.  Personal lifestyle choices, including: ? Daily care of your teeth and gums. ? Regular physical activity. ? Eating a healthy diet. ? Avoiding tobacco and drug use. ? Limiting alcohol use. ? Practicing safe sex. ? Taking low-dose aspirin daily starting at age 61. ? Taking vitamin and mineral supplements as recommended by your health care provider. What happens during an annual well check? The services and screenings done by your health care provider during your annual well check will depend on your age, overall health, lifestyle risk factors, and family history of disease. Counseling Your health care provider may ask you questions about your:  Alcohol use.  Tobacco use.  Drug use.  Emotional well-being.  Home and relationship well-being.  Sexual activity.  Eating habits.  Work and work Statistician.  Method of birth control.  Menstrual cycle.  Pregnancy history.  Screening You may have the following tests or measurements:  Height, weight, and BMI.  Blood pressure.  Lipid and cholesterol levels. These may be checked every 5 years, or more frequently if you are  over 29 years old.  Skin check.  Lung cancer screening. You may have this screening every year starting at age 43 if you have a 30-pack-year history of smoking and currently smoke or have quit within the past 15 years.  Fecal occult blood test (FOBT) of the stool. You may have this test every year starting at age 26.  Flexible sigmoidoscopy or colonoscopy. You may have a sigmoidoscopy every 5 years or a colonoscopy every 10 years starting at age 54.  Hepatitis C blood test.  Hepatitis B blood test.  Sexually transmitted disease (STD) testing.  Diabetes screening. This is done by checking your blood sugar (glucose) after you have not eaten for a while (fasting). You may have this done every 1-3 years.  Mammogram. This may be done every 1-2 years. Talk to your health care provider about when you should start having regular mammograms. This may depend on whether you have a family history of breast cancer.  BRCA-related cancer screening. This may be done if you have a family history of breast, ovarian, tubal, or peritoneal cancers.  Pelvic exam and Pap test. This may be done every 3 years starting at age 76. Starting at age 63, this may be done every 5 years if you have a Pap test in combination with an HPV test.  Bone density scan. This is done to screen for osteoporosis. You may have this scan if you are at high risk for osteoporosis.  Discuss your test results, treatment options, and if necessary, the need for more tests with your health care  provider. Vaccines Your health care provider may recommend certain vaccines, such as:  Influenza vaccine. This is recommended every year.  Tetanus, diphtheria, and acellular pertussis (Tdap, Td) vaccine. You may need a Td booster every 10 years.  Varicella vaccine. You may need this if you have not been vaccinated.  Zoster vaccine. You may need this after age 9.  Measles, mumps, and rubella (MMR) vaccine. You may need at least one dose of  MMR if you were born in 1957 or later. You may also need a second dose.  Pneumococcal 13-valent conjugate (PCV13) vaccine. You may need this if you have certain conditions and were not previously vaccinated.  Pneumococcal polysaccharide (PPSV23) vaccine. You may need one or two doses if you smoke cigarettes or if you have certain conditions.  Meningococcal vaccine. You may need this if you have certain conditions.  Hepatitis A vaccine. You may need this if you have certain conditions or if you travel or work in places where you may be exposed to hepatitis A.  Hepatitis B vaccine. You may need this if you have certain conditions or if you travel or work in places where you may be exposed to hepatitis B.  Haemophilus influenzae type b (Hib) vaccine. You may need this if you have certain conditions.  Talk to your health care provider about which screenings and vaccines you need and how often you need them. This information is not intended to replace advice given to you by your health care provider. Make sure you discuss any questions you have with your health care provider. Document Released: 04/26/2015 Document Revised: 12/18/2015 Document Reviewed: 01/29/2015 Elsevier Interactive Patient Education  [employer].

## 2017-05-28 NOTE — Assessment & Plan Note (Signed)
Depression screen negative. Health Maintenance reviewed. Preventive schedule discussed and handout given in AVS. Will obtain fasting labs today.  

## 2017-05-28 NOTE — Assessment & Plan Note (Signed)
Voucher for Amitiza given to make more cost effective. Continue as directed.

## 2017-05-31 ENCOUNTER — Other Ambulatory Visit: Payer: Self-pay | Admitting: Physician Assistant

## 2017-05-31 DIAGNOSIS — E559 Vitamin D deficiency, unspecified: Secondary | ICD-10-CM

## 2017-05-31 DIAGNOSIS — R7303 Prediabetes: Secondary | ICD-10-CM

## 2017-05-31 MED ORDER — VITAMIN D-3 25 MCG (1000 UT) PO CAPS: 1.0000 | ORAL_CAPSULE | Freq: Every day | ORAL | 0 refills | Status: DC

## 2017-05-31 MED ORDER — VITAMIN D (ERGOCALCIFEROL) 1.25 MG (50000 UNIT) PO CAPS: 50000.0000 [IU] | ORAL_CAPSULE | ORAL | 0 refills | Status: DC

## 2017-06-03 ENCOUNTER — Other Ambulatory Visit: Payer: Self-pay | Admitting: Physician Assistant

## 2017-06-03 ENCOUNTER — Telehealth: Payer: Self-pay | Admitting: Physician Assistant

## 2017-06-03 ENCOUNTER — Encounter: Payer: Self-pay | Admitting: Emergency Medicine

## 2017-06-03 ENCOUNTER — Encounter: Payer: Self-pay | Admitting: Physician Assistant

## 2017-06-03 DIAGNOSIS — E559 Vitamin D deficiency, unspecified: Secondary | ICD-10-CM

## 2017-06-03 DIAGNOSIS — G4733 Obstructive sleep apnea (adult) (pediatric): Secondary | ICD-10-CM

## 2017-06-03 MED ORDER — CETIRIZINE HCL 10 MG PO TABS: 5.0000 mg | ORAL_TABLET | Freq: Every day | ORAL | 1 refills | Status: DC

## 2017-06-03 MED ORDER — VITAMIN D-3 25 MCG (1000 UT) PO CAPS: 1.0000 | ORAL_CAPSULE | Freq: Every day | ORAL | 0 refills | Status: DC

## 2017-06-03 MED ORDER — LEVALBUTEROL TARTRATE 45 MCG/ACT IN AERO: 1.0000 | INHALATION_SPRAY | Freq: Four times a day (QID) | RESPIRATORY_TRACT | 2 refills | Status: DC | PRN

## 2017-06-03 NOTE — Telephone Encounter (Signed)
Sent a my chart message to patient due to work schedule. Easier to relay message.

## 2017-06-03 NOTE — Telephone Encounter (Signed)
Reviewed with sleep specialist. Will need in-lab sleep study. I have placed order for her. She will be contacted to schedule.

## 2017-06-08 ENCOUNTER — Other Ambulatory Visit: Payer: Self-pay | Admitting: Physician Assistant

## 2017-06-10 ENCOUNTER — Other Ambulatory Visit: Payer: Self-pay | Admitting: Emergency Medicine

## 2017-06-10 DIAGNOSIS — K219 Gastro-esophageal reflux disease without esophagitis: Secondary | ICD-10-CM

## 2017-06-10 MED ORDER — ESOMEPRAZOLE MAGNESIUM 40 MG PO CPDR: DELAYED_RELEASE_CAPSULE | ORAL | 1 refills | Status: DC

## 2017-06-24 ENCOUNTER — Encounter: Payer: Federal, State, Local not specified - PPO | Attending: Physician Assistant | Admitting: Skilled Nursing Facility1

## 2017-06-24 ENCOUNTER — Encounter: Payer: Self-pay | Admitting: Skilled Nursing Facility1

## 2017-06-24 DIAGNOSIS — R7303 Prediabetes: Secondary | ICD-10-CM | POA: Diagnosis not present

## 2017-06-24 DIAGNOSIS — E119 Type 2 diabetes mellitus without complications: Secondary | ICD-10-CM

## 2017-06-24 DIAGNOSIS — Z713 Dietary counseling and surveillance: Secondary | ICD-10-CM | POA: Diagnosis not present

## 2017-06-24 NOTE — Patient Instructions (Addendum)
-  [place] multivitamin   -Try plain no fat/no sugar greek yogurt with ranch packet and squeeze of lemon  -Aim for 64 ounces of water   -Take stairs whenever you see them, park far away, use the bathroom farthest away, and just stand at least every 60 minutes   -Keep water at your desk and drink it  -And have 2 carbohydrates choices for each meal

## 2017-06-24 NOTE — Progress Notes (Signed)
  Assessment:  Primary concerns today: referred for prediabetes but has an A1C of 6.5  Pt states she is trying to lose weight but it is not working. Pt states she works 2 jobs working until [employer]. [employer] she does struggle with migraines. Pt states he sleeps about 4-5 hours sometimes a nap at her second job. Pt states she falls asleep after she eats every time. Pt states she cooks over the weekend and eats it throughout the week. Pt states she needs medication for bowel movements  MEDICATIONS: See List   DIETARY INTAKE:  Usual eating pattern includes 3 meals and 2-3 snacks per day.  Everyday foods include none stated.  Avoided foods include bread.    24-hr recall: wakes 7:30am, leaves first job at 2 and at [employer] B ( 9AM): leftovers (chicken or [place] sausage and eggs) with berries or smoothie: lemon, celery, water, berries Snk ( AM): L ( 12PM): leftovers (chicken or [place] sausage and eggs)  Snk ( PM): celery and carrots with ranch D ( 5-6PM): salad with raspberry vinaigrette or hamburger without bun and vegetable Snk ( 8-9PM): carrots or celery with ranch or peanut butter  Beverages: 32 oz water, green tea with stevia, coffee with flavored creamer, kefir, orange juice, aloe vera and black cherry juice   Usual physical activity: ADL's  Estimated energy needs: 1500 calories 170 g carbohydrates 112 g protein 42 g fat  Progress Towards Goal(s):  In progress.    Intervention:  Nutrition counseling for Type 2 Diabetes. Goals: Alisa Graff multivitamin  -Try plain no fat/no sugar greek yogurt with ranch packet and squeeze of lemon -Aim for 64 ounces of water  -Take stairs whenever you see them, park far away, use the bathroom farthest away, and just stand at least every 60 minutes  -Keep water at your desk and drink it -And have 2 carbohydrates choices for each meal   Teaching Method Utilized: Visual Auditory Hands on  Barriers to learning/adherence to lifestyle change: work  schedule   Demonstrated degree of understanding via:  Teach Back   Monitoring/Evaluation:  Dietary intake, exercise, and body weight prn.

## 2017-06-29 DIAGNOSIS — K080 Exfoliation of teeth due to systemic causes: Secondary | ICD-10-CM | POA: Diagnosis not present

## 2017-07-01 DIAGNOSIS — M9901 Segmental and somatic dysfunction of cervical region: Secondary | ICD-10-CM | POA: Diagnosis not present

## 2017-07-01 DIAGNOSIS — M5412 Radiculopathy, cervical region: Secondary | ICD-10-CM | POA: Diagnosis not present

## 2017-07-01 DIAGNOSIS — M62838 Other muscle spasm: Secondary | ICD-10-CM | POA: Diagnosis not present

## 2017-07-01 DIAGNOSIS — R51 Headache: Secondary | ICD-10-CM | POA: Diagnosis not present

## 2017-07-02 ENCOUNTER — Ambulatory Visit (HOSPITAL_BASED_OUTPATIENT_CLINIC_OR_DEPARTMENT_OTHER): Payer: Federal, State, Local not specified - PPO | Attending: Physician Assistant | Admitting: Internal Medicine

## 2017-07-02 VITALS — Ht 67.0 in | Wt 236.0 lb

## 2017-07-02 DIAGNOSIS — G4733 Obstructive sleep apnea (adult) (pediatric): Secondary | ICD-10-CM | POA: Diagnosis not present

## 2017-07-02 DIAGNOSIS — R0683 Snoring: Secondary | ICD-10-CM | POA: Diagnosis not present

## 2017-07-05 DIAGNOSIS — Z1211 Encounter for screening for malignant neoplasm of colon: Secondary | ICD-10-CM | POA: Diagnosis not present

## 2017-07-10 DIAGNOSIS — G4733 Obstructive sleep apnea (adult) (pediatric): Secondary | ICD-10-CM

## 2017-07-10 NOTE — Procedures (Signed)
  Patient Name: Natalie Vazquez, Natalie Vazquez Date: 07/02/2017 Gender: Female D.O.B: 10/17/1960 Age (years): 57 Referring Provider: Brunetta Jeans PA-C Height (inches): 54 Interpreting Physician: Baird Lyons MD, ABSM Weight (lbs): 236 RPSGT: Earney [place] BMI: 37 MRN: [id number] Neck Size: 15.50  CLINICAL INFORMATION Sleep Study Type: NPSG Indication for sleep study: OSA  Epworth Sleepiness Score: 20  SLEEP STUDY TECHNIQUE As per the AASM Manual for the Scoring of Sleep and Associated Events v2.3 (April 2016) with a hypopnea requiring 4% desaturations.  The channels recorded and monitored were frontal, central and occipital EEG, electrooculogram (EOG), submentalis EMG (chin), nasal and oral airflow, thoracic and abdominal wall motion, anterior tibialis EMG, snore microphone, electrocardiogram, and pulse oximetry.  MEDICATIONS Medications self-administered by patient taken the night of the study : none reported  SLEEP ARCHITECTURE The study was initiated at 10:53:26 PM and ended at 4:52:32 AM.  Sleep onset time was 4.3 minutes and the sleep efficiency was 96.4%%. The total sleep time was 346.3 minutes.  Stage REM latency was 61.0 minutes.  The patient spent 1.0%% of the night in stage N1 sleep, 85.7%% in stage N2 sleep, 0.0%% in stage N3 and 13.28% in REM.  Alpha intrusion was absent.  Supine sleep was 69.97%.  RESPIRATORY PARAMETERS The overall apnea/hypopnea index (AHI) was 14.0 per hour. There were 5 total apneas, including 5 obstructive, 0 central and 0 mixed apneas. There were 76 hypopneas and 15 RERAs.  The AHI during Stage REM sleep was 57.4 per hour.  AHI while supine was 16.6 per hour.  The mean oxygen saturation was 94.2%. The minimum SpO2 during sleep was 72.0%.  loud snoring was noted during this study.  CARDIAC DATA The 2 lead EKG demonstrated sinus rhythm. The mean heart rate was 58.4 beats per minute. Other EKG findings include: None.  LEG MOVEMENT  DATA The total PLMS were 0 with a resulting PLMS index of 0.0. Associated arousal with leg movement index was 0.0 .  IMPRESSIONS - Mild to moderate obstructive sleep apnea occurred during this study (AHI = 14.0/h). Events mostly with REM sleep. - Insufficient early events to meet protocol requirement for split CPAP titration. - No significant central sleep apnea occurred during this study (CAI = 0.0/h). - Moderate oxygen desaturation was noted during this study (Min O2 = 72.0%). Mean 94.2% - The patient snored with loud snoring volume. - No cardiac abnormalities were noted during this study. - Clinically significant periodic limb movements did not occur during sleep. No significant associated arousals.  DIAGNOSIS - Obstructive Sleep Apnea (327.23 [G47.33 ICD-10])  RECOMMENDATIONS - Recommend CPAP titration study or AutoPAP. Otherr options, such as a fitted oral appliance, would be based on clinical; judgment. - Be careful with alcohol, sedatives and other CNS depressants that may worsen sleep apnea and disrupt normal sleep architecture. - Sleep hygiene should be reviewed to assess factors that may improve sleep quality. - Weight management and regular exercise should be initiated or continued if appropriate.  [Electronically signed] 07/10/2017 11:01 AM  Baird Lyons MD, ABSM Diplomate, American Board of Sleep Medicine   NPI: [id number]                          [place] [place] [facility]  ELECTRONICALLY SIGNED ON:  07/10/2017, 10:58 AM [place] PH: [phone]   FX: [phone] [place]

## 2017-07-12 ENCOUNTER — Other Ambulatory Visit: Payer: Self-pay | Admitting: Physician Assistant

## 2017-07-12 DIAGNOSIS — G4733 Obstructive sleep apnea (adult) (pediatric): Secondary | ICD-10-CM

## 2017-07-14 DIAGNOSIS — K080 Exfoliation of teeth due to systemic causes: Secondary | ICD-10-CM | POA: Diagnosis not present

## 2017-07-17 ENCOUNTER — Other Ambulatory Visit: Payer: Self-pay | Admitting: Physician Assistant

## 2017-07-19 ENCOUNTER — Other Ambulatory Visit: Payer: Self-pay | Admitting: Physician Assistant

## 2017-07-28 ENCOUNTER — Ambulatory Visit: Payer: Federal, State, Local not specified - PPO | Admitting: Skilled Nursing Facility1

## 2017-08-09 ENCOUNTER — Other Ambulatory Visit: Payer: Self-pay | Admitting: Physician Assistant

## 2017-08-09 ENCOUNTER — Ambulatory Visit: Payer: Federal, State, Local not specified - PPO | Admitting: Skilled Nursing Facility1

## 2017-08-09 DIAGNOSIS — E559 Vitamin D deficiency, unspecified: Secondary | ICD-10-CM

## 2017-08-13 DIAGNOSIS — L989 Disorder of the skin and subcutaneous tissue, unspecified: Secondary | ICD-10-CM | POA: Diagnosis not present

## 2017-08-13 DIAGNOSIS — K5900 Constipation, unspecified: Secondary | ICD-10-CM | POA: Diagnosis not present

## 2017-08-27 ENCOUNTER — Encounter (HOSPITAL_BASED_OUTPATIENT_CLINIC_OR_DEPARTMENT_OTHER): Payer: Federal, State, Local not specified - PPO

## 2017-08-30 ENCOUNTER — Encounter: Payer: Self-pay | Admitting: Family Medicine

## 2017-09-03 ENCOUNTER — Ambulatory Visit (HOSPITAL_BASED_OUTPATIENT_CLINIC_OR_DEPARTMENT_OTHER): Payer: Federal, State, Local not specified - PPO | Attending: Physician Assistant | Admitting: Internal Medicine

## 2017-09-03 ENCOUNTER — Other Ambulatory Visit: Payer: Self-pay | Admitting: Physician Assistant

## 2017-09-03 VITALS — Ht 67.0 in | Wt 237.0 lb

## 2017-09-03 DIAGNOSIS — R0683 Snoring: Secondary | ICD-10-CM | POA: Diagnosis not present

## 2017-09-03 DIAGNOSIS — G4730 Sleep apnea, unspecified: Secondary | ICD-10-CM | POA: Diagnosis not present

## 2017-09-03 DIAGNOSIS — G4733 Obstructive sleep apnea (adult) (pediatric): Secondary | ICD-10-CM | POA: Diagnosis not present

## 2017-09-03 NOTE — Telephone Encounter (Signed)
Zonisamide refill, last filled by historical provider.  Last OV: 05/28/17 [place]: [facility]   [place]

## 2017-09-03 NOTE — Telephone Encounter (Signed)
Copied from [place] [id number]. Topic: Quick Communication - Rx Refill/Question >> Sep 03, 2017  5:49 PM Robina Ade, Helene Kelp D wrote: Medication:  zonisamide (ZONEGRAN) 25 MG capsule   Has the patient contacted their pharmacy? Yes} (Agent: If no, request that the patient contact the pharmacy for the refill.) (Agent: If yes, when and what did the pharmacy advise?)  Preferred Pharmacy (with phone number or street name): [facility] [place], [place]  Agent: Please be advised that RX refills may take up to 3 business days. We ask that you follow-up with your pharmacy.

## 2017-09-07 ENCOUNTER — Other Ambulatory Visit (HOSPITAL_BASED_OUTPATIENT_CLINIC_OR_DEPARTMENT_OTHER): Payer: Self-pay

## 2017-09-07 DIAGNOSIS — G4733 Obstructive sleep apnea (adult) (pediatric): Secondary | ICD-10-CM

## 2017-09-07 NOTE — Telephone Encounter (Signed)
Rx last filled on 03/06/16 5RF Please advised of refill

## 2017-09-09 ENCOUNTER — Encounter: Payer: Self-pay | Admitting: Emergency Medicine

## 2017-09-11 ENCOUNTER — Encounter: Payer: Self-pay | Admitting: Physician Assistant

## 2017-09-11 DIAGNOSIS — G4733 Obstructive sleep apnea (adult) (pediatric): Secondary | ICD-10-CM

## 2017-09-11 NOTE — Procedures (Signed)
Patient Name: Natalie Vazquez, Natalie Vazquez Date: 09/03/2017 Gender: Female D.O.B: 19-Aug-1960 Age (years): 57 Referring Provider: Brunetta Jeans PA-C Height (inches): 4 Interpreting Physician: Baird Lyons MD, ABSM Weight (lbs): 236 RPSGT: Earney Hamburg BMI: 37 MRN: [id number] Neck Size: 15.50  CLINICAL INFORMATION The patient is referred for a CPAP titration to treat sleep apnea.  Date of NPSG, Split Night or HST:   NPSG 07/02/17   AHI 14/ hr, desaturaation to 72%, body weight 236 lbs  SLEEP STUDY TECHNIQUE As per the AASM Manual for the Scoring of Sleep and Associated Events v2.3 (April 2016) with a hypopnea requiring 4% desaturations.  The channels recorded and monitored were frontal, central and occipital EEG, electrooculogram (EOG), submentalis EMG (chin), nasal and oral airflow, thoracic and abdominal wall motion, anterior tibialis EMG, snore microphone, electrocardiogram, and pulse oximetry. Continuous positive airway pressure (CPAP) was initiated at the beginning of the study and titrated to treat sleep-disordered breathing.  MEDICATIONS Medications self-administered by patient taken the night of the study : none reported  TECHNICIAN COMMENTS Comments added by technician: Patient had difficulty initiating sleep. Comments added by scorer: N/A  RESPIRATORY PARAMETERS Optimal PAP Pressure (cm): 12 AHI at Optimal Pressure (/hr): 0.0 Overall Minimal O2 (%): 90.0 Supine % at Optimal Pressure (%): 32 Minimal O2 at Optimal Pressure (%): 92.0   SLEEP ARCHITECTURE The study was initiated at 10:56:19 PM and ended at 4:58:53 AM.  Sleep onset time was 55.9 minutes and the sleep efficiency was 83.1%%. The total sleep time was 301.2 minutes.  The patient spent 0.8%% of the night in stage N1 sleep, 94.9%% in stage N2 sleep, 0.0%% in stage N3 and 4.32% in REM.Stage REM latency was 52.5 minutes  Wake after sleep onset was 5.5. Alpha intrusion was absent. Supine sleep was  27.59%.  CARDIAC DATA The 2 lead EKG demonstrated sinus rhythm. The mean heart rate was 66.9 beats per minute. Other EKG findings include: None.  LEG MOVEMENT DATA The total Periodic Limb Movements of Sleep (PLMS) were 0. The PLMS index was 0.0. A PLMS index of <15 is considered normal in adults.  IMPRESSIONS - The optimal PAP pressure was 12 cm of water. - Central sleep apnea was not noted during this titration (CAI = 0.0/h). - Significant oxygen desaturations were not observed during this titration (min O2 = 90.0%). - The patient snored with moderate snoring volume during this titration study. - No cardiac abnormalities were observed during this study. - Clinically significant periodic limb movements were not noted during this study. Arousals associated with PLMs were rare.  DIAGNOSIS - Obstructive Sleep Apnea (327.23 [G47.33 ICD-10])  RECOMMENDATIONS - Trial of CPAP therapy on 12 cm H2O or autopap 5-15.  Patient used a Small size Fisher&Paykel Full Face Mask Simplus mask and heated humidification. - Be careful with alcohol, sedatives and other CNS depressants that may worsen sleep apnea and disrupt normal sleep architecture. - Sleep hygiene should be reviewed to assess factors that may improve sleep quality. - Weight management and regular exercise should be initiated or continued.  [Electronically signed] 09/11/2017 03:10 PM  Baird Lyons MD, [place], American Board of Sleep Medicine   NPI: [id number]                          [place], [place] [facility]  ELECTRONICALLY SIGNED ON:  09/11/2017, 3:07 PM [place] PH: [phone]   FX: [phone] ACCREDITED BY  THE AMERICAN ACADEMY OF SLEEP MEDICINE

## 2017-09-13 ENCOUNTER — Encounter: Payer: Self-pay | Admitting: Emergency Medicine

## 2017-09-16 ENCOUNTER — Telehealth: Payer: Self-pay | Admitting: Physician Assistant

## 2017-09-16 NOTE — Telephone Encounter (Signed)
Charted in result notes. 

## 2017-09-16 NOTE — Telephone Encounter (Signed)
Copied from [place] [id number]. Topic: Quick Communication - See Telephone Encounter >> Sep 16, 2017 10:10 AM Bea Graff, NT wrote: CRM for notification. See Telephone encounter for: 09/16/17. Pt calling back to get her lab results. She gets out of class at 3:45pm. Please call after this time.

## 2017-09-27 DIAGNOSIS — G4733 Obstructive sleep apnea (adult) (pediatric): Secondary | ICD-10-CM | POA: Diagnosis not present

## 2017-09-30 ENCOUNTER — Telehealth: Payer: Self-pay | Admitting: Physician Assistant

## 2017-09-30 NOTE — Telephone Encounter (Signed)
Received paperwork, via fax, from [employer]. Requesting the paperwork be reviewed, completed, and faxed back. Placed in front bin with charge sheet.

## 2017-10-01 NOTE — Telephone Encounter (Signed)
Paperwork given to PCP for completion.  

## 2017-10-03 NOTE — Telephone Encounter (Signed)
Paperwork completed. Will fax once I am back in office on Monday.

## 2017-10-13 ENCOUNTER — Encounter: Payer: Self-pay | Admitting: Physician Assistant

## 2017-10-13 ENCOUNTER — Other Ambulatory Visit: Payer: Self-pay | Admitting: Physician Assistant

## 2017-10-13 NOTE — Telephone Encounter (Signed)
Copied from [place] [id number]. Topic: Quick Communication - Rx Refill/Question >> Oct 13, 2017 10:36 AM Synthia Innocent wrote: Medication: zonisamide (ZONEGRAN) 25 MG capsule   Has the patient contacted their pharmacy? Yes.   (Agent: If no, request that the patient contact the pharmacy for the refill.) (Agent: If yes, when and what did the pharmacy advise?)  Preferred Pharmacy (with phone number [facility] street name[facility] [address]  Agent: Please be advised that RX refills may take up to 3 business days. We ask that you follow-up with your pharmacy.

## 2017-10-15 NOTE — Telephone Encounter (Signed)
Pt called about the medication and is checking the status of having it filled

## 2017-10-18 ENCOUNTER — Encounter: Payer: Self-pay | Admitting: *Deleted

## 2017-10-18 NOTE — Telephone Encounter (Signed)
Mychart message sent to patient.  This is not a medication that we fill.  Patient aware to call neurology.

## 2017-10-27 DIAGNOSIS — G4733 Obstructive sleep apnea (adult) (pediatric): Secondary | ICD-10-CM | POA: Diagnosis not present

## 2017-11-03 ENCOUNTER — Emergency Department (HOSPITAL_BASED_OUTPATIENT_CLINIC_OR_DEPARTMENT_OTHER)
Admission: EM | Admit: 2017-11-03 | Discharge: 2017-11-03 | Disposition: A | Discharge status: Home / Self Care | Payer: Federal, State, Local not specified - PPO | Attending: Emergency Medicine | Admitting: Emergency Medicine

## 2017-11-03 ENCOUNTER — Other Ambulatory Visit: Payer: Self-pay

## 2017-11-03 ENCOUNTER — Encounter (HOSPITAL_BASED_OUTPATIENT_CLINIC_OR_DEPARTMENT_OTHER): Payer: Self-pay | Admitting: Emergency Medicine

## 2017-11-03 ENCOUNTER — Emergency Department (HOSPITAL_BASED_OUTPATIENT_CLINIC_OR_DEPARTMENT_OTHER): Payer: Federal, State, Local not specified - PPO

## 2017-11-03 DIAGNOSIS — M25562 Pain in left knee: Secondary | ICD-10-CM | POA: Diagnosis not present

## 2017-11-03 DIAGNOSIS — Z823 Family history of stroke: Secondary | ICD-10-CM | POA: Insufficient documentation

## 2017-11-03 DIAGNOSIS — Z91040 Latex allergy status: Secondary | ICD-10-CM | POA: Insufficient documentation

## 2017-11-03 DIAGNOSIS — J45909 Unspecified asthma, uncomplicated: Secondary | ICD-10-CM | POA: Diagnosis not present

## 2017-11-03 DIAGNOSIS — H9319 Tinnitus, unspecified ear: Secondary | ICD-10-CM | POA: Diagnosis not present

## 2017-11-03 DIAGNOSIS — E119 Type 2 diabetes mellitus without complications: Secondary | ICD-10-CM | POA: Diagnosis not present

## 2017-11-03 DIAGNOSIS — R200 Anesthesia of skin: Secondary | ICD-10-CM | POA: Diagnosis not present

## 2017-11-03 DIAGNOSIS — R0602 Shortness of breath: Secondary | ICD-10-CM | POA: Diagnosis not present

## 2017-11-03 DIAGNOSIS — M79602 Pain in left arm: Secondary | ICD-10-CM | POA: Insufficient documentation

## 2017-11-03 DIAGNOSIS — Z8669 Personal history of other diseases of the nervous system and sense organs: Secondary | ICD-10-CM | POA: Diagnosis not present

## 2017-11-03 DIAGNOSIS — Z79899 Other long term (current) drug therapy: Secondary | ICD-10-CM | POA: Insufficient documentation

## 2017-11-03 LAB — COMPREHENSIVE METABOLIC PANEL
ALBUMIN: 3.7 g/dL (ref 3.5–5.0)
ALK PHOS: 60 U/L (ref 38–126)
ALT: 24 U/L (ref 0–44)
ANION GAP: 8 (ref 5–15)
AST: 27 U/L (ref 15–41)
BILIRUBIN TOTAL: 0.2 mg/dL — AB (ref 0.3–1.2)
BUN: 7 mg/dL (ref 6–20)
CALCIUM: 8.6 mg/dL — AB (ref 8.9–10.3)
CO2: 25 mmol/L (ref 22–32)
Chloride: 104 mmol/L (ref 98–111)
Creatinine, Ser: 0.72 mg/dL (ref 0.44–1.00)
GFR calc non Af Amer: >60 mL/min (ref >60)
Glucose, Bld: 130 mg/dL — ABNORMAL HIGH (ref 70–99)
POTASSIUM: 3.7 mmol/L (ref 3.5–5.1)
SODIUM: 137 mmol/L (ref 135–145)
TOTAL PROTEIN: 7.6 g/dL (ref 6.5–8.1)

## 2017-11-03 LAB — URINALYSIS, ROUTINE W REFLEX MICROSCOPIC
Bilirubin Urine: NEGATIVE
Glucose, UA: NEGATIVE mg/dL
Hgb urine dipstick: NEGATIVE
KETONES UR: NEGATIVE mg/dL
LEUKOCYTES UA: NEGATIVE
NITRITE: NEGATIVE
PROTEIN: NEGATIVE mg/dL
Specific Gravity, Urine: <1.005 — ABNORMAL LOW (ref 1.005–1.030)
pH: 6 (ref 5.0–8.0)

## 2017-11-03 LAB — CBC
HEMATOCRIT: 38.7 % (ref 36.0–46.0)
Hemoglobin: 12.7 g/dL (ref 12.0–15.0)
MCH: 25 pg — AB (ref 26.0–34.0)
MCHC: 32.8 g/dL (ref 30.0–36.0)
MCV: 76.2 fL — AB (ref 78.0–100.0)
Platelets: 267 10*3/uL (ref 150–400)
RBC: 5.08 MIL/uL (ref 3.87–5.11)
RDW: 16.2 % — ABNORMAL HIGH (ref 11.5–15.5)
WBC: 5.9 10*3/uL (ref 4.0–10.5)

## 2017-11-03 LAB — TROPONIN I

## 2017-11-03 LAB — CBG MONITORING, ED: GLUCOSE-CAPILLARY: 160 mg/dL — AB (ref 70–99)

## 2017-11-03 LAB — D-DIMER, QUANTITATIVE: D-Dimer, Quant: 0.32 ug/mL-FEU (ref 0.00–0.50)

## 2017-11-03 LAB — CK: CK TOTAL: 199 U/L (ref 38–234)

## 2017-11-03 MED ORDER — SODIUM CHLORIDE 0.9 % IV BOLUS
500.0000 mL | Freq: Once | INTRAVENOUS | Status: AC
  Administered 2017-11-03: 500 mL via INTRAVENOUS

## 2017-11-03 MED ORDER — KETOROLAC TROMETHAMINE 15 MG/ML IJ SOLN
15.0000 mg | Freq: Once | INTRAMUSCULAR | Status: AC
  Administered 2017-11-03: 15 mg via INTRAVENOUS
  Filled 2017-11-03: qty 1

## 2017-11-03 MED ORDER — MELOXICAM 7.5 MG PO TABS: 7.5000 mg | ORAL_TABLET | Freq: Every day | ORAL | 0 refills | Status: DC

## 2017-11-03 NOTE — Discharge Instructions (Addendum)
Take mobic once a day with meals.   You may supplement with Tylenol if you need further pain control. Use ice packs or heating pads if this helps control your pain. Make sure you are staying well hydrated with water. Your urine should be clear to pale yellow.  Follow up with your primary care doctor for further evaluation and management of your symptoms.  Return to the ER if you develop chest pain, you are dropping things with your hands, vision changes, slurred speech, or any new or concerning symptoms.

## 2017-11-03 NOTE — ED Triage Notes (Signed)
Reports left forearm pain since 1200 today.  Denies injury.  Reports arm is now numb and has moved into her fingers.  States her hand hurts with movement.  Reports the pain now radiates into her shoulder and she has left sided body pain.  Reports she took tylenol PTA.  Denies dizziness but states "I have a ringing in my head".

## 2017-11-03 NOTE — ED Provider Notes (Signed)
[facility] EMERGENCY DEPARTMENT Provider Note   CSN: [id number] Arrival date & time: 11/03/17  1610     History   Chief Complaint Chief Complaint  Patient presents with  . Arm Pain    HPI Natalie Vazquez is a 57 y.o. female presenting for evaluation of left arm pain, left leg pain, shortness of breath.  Patient states she was at work when she had acute onset of posterior left arm pain.  Pain is worse with movement.  It started radiating down to her fingers and up to her shoulder.  She now describes it as her left arm is submerged in icy water.  Since then, she is also noticed left knee swelling and stiffness.  Additionally, she has shortness of breath, worse with exertion.  Patient states she recently drove back from [place] approximately 10 days ago.  2 weeks prior, she flew to and from [place].  She denies fevers, chills, chest pain, nausea, vomiting, abdominal pain, urinary symptoms, abnormal bowel movements.  She is not on hormones or estrogen pills.  She denies recent surgeries, immobilization, trauma.  She denies history of cancer or previous DVT/PE.  Additionally, patient reports she has a ringing in her head.  She has chronic tinnitus, but states this feels different.  She denies vision changes, slurred speech, numbness, or difficulty walking.  He denies dizziness or lightheadedness.  History significant for anxiety, GERD, migraine, fibroids, fibromyalgia, anemia, symptomatic menopause, diabetes.  HPI  Past Medical History:  Diagnosis Date  . Allergic rhinitis due to other allergen   . Allergy    seasonal  . Anemia   . Arthritis   . Asthma    mild, intermittent  . Chicken pox   . Cholelithiasis   . CTS (carpal tunnel syndrome)   . Diabetes mellitus without complication ([place])   . Fibroids   . Fibromyalgia   . Frequent urination   . GERD (gastroesophageal reflux disease)   . Headache(784.0)   . Migraine   . PONV (postoperative nausea and vomiting)    difficulty waking up  . Problems related to high-risk sexual behavior    Unprotected sex  . Symptomatic menopausal or female climacteric states     Patient Active Problem List   Diagnosis Date Noted  . OSA (obstructive sleep apnea) 05/28/2017  . Migraines 09/28/2013  . Visit for preventive health examination 09/10/2013  . GERD (gastroesophageal reflux disease) 03/19/2013  . Seasonal allergies 03/19/2013  . Neck pain, chronic 03/19/2013  . IBS (irritable bowel syndrome) 03/19/2013  . Fracture, metacarpal shaft 01/22/2012    Past Surgical History:  Procedure Laterality Date  . ABDOMINAL HYSTERECTOMY  2008   partial, ovaries remain  . CARPAL TUNNEL RELEASE Right 2015  . CHOLECYSTECTOMY  05/19/2011   Procedure: LAPAROSCOPIC CHOLECYSTECTOMY;  Surgeon: Zenovia Jarred, MD;  Location: [facility];  Service: General;  Laterality: N/A;  . LASER ABLATION [place] / VULVAR    . PERCUTANEOUS PINNING  01/22/2012   Procedure: PERCUTANEOUS PINNING EXTREMITY;  Surgeon: Marybelle Killings, MD;  Location: [facility];  Service: Orthopedics;  Laterality: Right;  Closed Reduction and Pinning right 5th Metacarpal Fracture  . TUBAL LIGATION    . WISDOM TOOTH EXTRACTION       OB History   None      Home Medications    Prior to Admission medications   Medication Sig Start Date End Date Taking? Authorizing Provider  Biotin 1000 MCG tablet Take 1,000 mcg by mouth daily.  [provider]  cetirizine (ZYRTEC) 10 MG tablet Take 0.5 tablets (5 mg total) by mouth daily. 06/03/17   Brunetta Jeans, PA-C  Cholecalciferol (VITAMIN D-3) 1000 units CAPS Take 1 capsule (1,000 Units total) by mouth daily. 06/03/17   Brunetta Jeans, PA-C  cyclobenzaprine (FLEXERIL) 10 MG tablet cyclobenzaprine 10 mg tablet    [provider]  esomeprazole (NEXIUM) 40 MG capsule TAKE 1 CAPSULE BY MOUTH EVERY MORNING BEFORE BREAKFAST 06/10/17   Brunetta Jeans, PA-C  levalbuterol [facility] HFA) 45 MCG/ACT inhaler  INHALE 1- 2 PUFFS BY MOUTH EVERY 6 HOURS AS NEEDED FOR WHEEZING 10/13/17   Brunetta Jeans, PA-C  meloxicam (MOBIC) 7.5 MG tablet Take 1 tablet (7.5 mg total) by mouth daily. 11/03/17   Devonda Pequignot, PA-C  Multiple Vitamins-Minerals (MULTIVITAMIN WITH MINERALS) tablet Take 1 tablet by mouth daily. Daily MVI w/Iron; Alternate w/Prenatal MVI    [provider]  NON FORMULARY Take 3 tablets by mouth as needed. [employer] Kriss Herbal Laxative Tab    [provider]  SUMAtriptan (IMITREX) 100 MG tablet Take 1 tablet (100 mg total) by mouth once for 1 dose. May repeat in 2 hours if headache persists or recurs. 06/08/17   Brunetta Jeans, PA-C  Vitamin D, Ergocalciferol, (DRISDOL) 50000 units CAPS capsule Take 1 capsule (50,000 Units total) by mouth every 7 (seven) days. 05/31/17   Brunetta Jeans, PA-C  vitamin E 400 UNIT capsule Take 400 Units by mouth daily.    [provider]  zonisamide (ZONEGRAN) 25 MG capsule Take 1 capsule by mouth daily. 03/06/16   [provider]    Family History Family History  Problem Relation Age of Onset  . Diabetes Mother        Deceased  . Hypertension Mother   . Anemia Mother   . Hyperlipidemia Mother   . Arthritis/Rheumatoid Mother   . Migraines Mother   . CVA Mother   . Hypertension Father        Deceased  . Stroke Father   . Diabetes Father        Legs Amputated  . Heart disease Father   . Arthritis Father   . Cancer Father   . Heart attack Maternal Aunt   . Diabetes Maternal Uncle   . Migraines Maternal Aunt   . Asthma Maternal Aunt   . Asthma Maternal Uncle   . Stomach cancer Sister   . Breast cancer Sister   . Uterine cancer Sister        or Ovarian  . Sleep apnea Brother   . COPD Brother   . Asthma Sister        x2  . Sudden death Brother        SIDS  . Hypertension Brother   . Diabetes Sister   . Asthma Daughter        x2  . Allergies Daughter   . Sickle cell trait Other        All Children  .  Atrial fibrillation Daughter   . Anxiety disorder Daughter   . Allergies Son   . Hypertension Sister   . Heart disease Sister   . Asthma Brother     Social History Social History   Tobacco Use  . Smoking status: Never Smoker  . Smokeless tobacco: Never Used  Substance Use Topics  . Alcohol use: Yes    Comment: rarely   . Drug use: No     Allergies  Iodine; Shellfish allergy; and Latex   Review of Systems Review of Systems  HENT: Positive for tinnitus ("Head ringing").   Respiratory: Positive for shortness of breath.   Musculoskeletal: Positive for arthralgias, joint swelling and myalgias.  Hematological: Does not bruise/bleed easily.  All other systems reviewed and are negative.    Physical Exam Updated Vital Signs BP 117/79   Pulse 72   Temp 98.1 F (36.7 C) (Oral)   Resp 19   Ht 5\' 7"  (1.702 m)   Wt 103 kg (227 lb)   SpO2 97%   BMI 35.55 kg/m   Physical Exam  Constitutional: She is oriented to person, place, and time. She appears well-developed and well-nourished. No distress.  Obese female, appears in no distress  HENT:  Head: Normocephalic and atraumatic.  Right Ear: Tympanic membrane, external ear and ear canal normal.  Left Ear: Tympanic membrane, external ear and ear canal normal.  Nose: Nose normal.  Mouth/Throat: Uvula is midline, oropharynx is clear and moist and mucous membranes are normal.  Eyes: Pupils are equal, round, and reactive to light. Conjunctivae and EOM are normal.  EOMI and PERRLA.  No nystagmus.  Neck: Normal range of motion. Neck supple.  Cardiovascular: Normal rate, regular rhythm and intact distal pulses.  Pulmonary/Chest: Effort normal and breath sounds normal. No respiratory distress. She has no wheezes.  Speaking in full sentences.  Clear lung sounds in all fields.  No signs of respiratory distress or shortness of breath  Abdominal: Soft. She exhibits no distension and no mass. There is no tenderness. There is no guarding.   Musculoskeletal: Normal range of motion.  Focal tenderness palpation of mid left forearm on the dorsal side without erythema, warmth, or swelling.  No obvious injury.  Full active range of motion of the shoulder and elbow without pain.  Pain with passive range of motion of the wrist, worse with dorsiflexion.  Radial pulses intact bilaterally.  Grip strength intact bilaterally.  Soft compartments. Mild swelling of the left knee noted without erythema or warmth.  Pedal pulses intact bilaterally.  Sensation intact bilaterally.  No pain or difficulty with straight leg raise.  Neurological: She is alert and oriented to person, place, and time. She has normal strength and normal reflexes. No cranial nerve deficit or sensory deficit. Coordination normal. GCS eye subscore is 4. GCS verbal subscore is 5. GCS motor subscore is 6.  No obvious neurologic deficits.  CN intact.  Nose to finger intact.  Fine movement and coordination intact.  Patellar reflexes intact.  Skin: Skin is warm and dry. Capillary refill takes less than 2 seconds.  Psychiatric: She has a normal mood and affect.  Nursing note and vitals reviewed.    ED Treatments / Results  Labs (all labs ordered are listed, but only abnormal results are displayed) Labs Reviewed  CBC - Abnormal; Notable for the following components:      Result Value   MCV 76.2 (*)    MCH 25.0 (*)    RDW 16.2 (*)    All other components within normal limits  COMPREHENSIVE METABOLIC PANEL - Abnormal; Notable for the following components:   Glucose, Bld 130 (*)    Calcium 8.6 (*)    Total Bilirubin 0.2 (*)    All other components within normal limits  URINALYSIS, ROUTINE W REFLEX MICROSCOPIC - Abnormal; Notable for the following components:   Specific Gravity, Urine <1.005 (*)    All other components within normal limits  CBG MONITORING, ED -  Abnormal; Notable for the following components:   Glucose-Capillary 160 (*)    All other components within normal  limits  D-DIMER, QUANTITATIVE (NOT AT [facility])  TROPONIN I  CK    EKG EKG Interpretation  Date/Time:  Wednesday November 03 2017 16:29:47 EDT Ventricular Rate:  105 PR Interval:    QRS Duration: 100 QT Interval:  364 QTC Calculation: 482 R Axis:   65 Text Interpretation:  Sinus tachycardia Probable inferior infarct, old Artifact in lead(s) I III aVR aVL No STEMI.  Confirmed by Nanda Quinton [phone]) on 11/03/2017 4:38:11 PM   Radiology Dg Chest 2 View  Result Date: 11/03/2017 CLINICAL DATA:  Initial evaluation for acute shortness of breath. Left arm numbness and pain. EXAM: CHEST - 2 VIEW COMPARISON:  Prior radiograph from 01/09/2016. FINDINGS: The cardiac and mediastinal silhouettes are within normal limits. The lungs are mildly hypoinflated with elevation of the left hemidiaphragm. No airspace consolidation, pleural effusion, or pulmonary edema is identified. There is no pneumothorax. No acute osseous abnormality identified. IMPRESSION: No active cardiopulmonary disease. Electronically Signed   By: Jeannine Boga M.D.   On: 11/03/2017 17:52   Ct Head Wo Contrast  Result Date: 11/03/2017 CLINICAL DATA:  Left forearm pain since noon today. The arm has subsequently become numb, extending into the fingers. Left side body pain. Tinnitus. EXAM: CT HEAD WITHOUT CONTRAST TECHNIQUE: Contiguous axial images were obtained from the base of the skull through the vertex without intravenous contrast. COMPARISON:  Brain MR dated 10/16/2009. FINDINGS: Brain: Normal appearing cerebral hemispheres and posterior fossa structures. Normal size and position of the ventricles. No intracranial hemorrhage, mass lesion or CT evidence of acute infarction. Vascular: No hyperdense vessel or unexpected calcification. Skull: Normal. Negative for fracture or focal lesion. Sinuses/Orbits: Unremarkable. Other: None. IMPRESSION: Normal examination. Electronically Signed   By: Claudie Revering M.D.   On: 11/03/2017 17:39     Procedures Procedures (including critical care time)  Medications Ordered in ED Medications  ketorolac (TORADOL) 15 MG/ML injection 15 mg (15 mg Intravenous Given 11/03/17 1822)  sodium chloride 0.9 % bolus 500 mL (0 mLs Intravenous Stopped 11/03/17 1901)     Initial Impression / Assessment and Plan / ED Course  I have reviewed the triage vital signs and the nursing notes.  Pertinent labs & imaging results that were available during my care of the patient were reviewed by me and considered in my medical decision making (see chart for details).     Patient presenting for evaluation of left arm and leg pain, and shortness of breath.  Physical exam reassuring, she appears nontoxic.  Heart rate mildly elevated around 100, improves at rest.  No signs of respiratory distress.  No focal neurologic deficits, doubt CVA.  As patient has had recent travel, and now reports shortness of breath, will order d-dimer to assess for PE/DVT and basic blood work.  Will order troponin, EKG, chest x-ray, CT head, and CK.  Labs reassuring, no leukocytosis.  Creatinine stable.  D-dimer negative.  Troponin negative.  CK negative.  UA without infection, shows signs of dehydration.  Chest x-ray reviewed and interpreted by me, no pneumonia, pneumothorax, or effusions.  CT head without acute findings, no signs of ischemia or bleed.  EKG without STEMI.  Case discussed with attending, Dr. Laverta Baltimore evaluated the patient.  Will give bolus of fluid and Toradol for symptom control.  On reassessment, patient reports symptoms have improved with Toradol.  Elevated heart rate has resolved.  Patient reports decreased abnormal  sensation of the left arm.  She remains neurologically intact.  At this time, doubt CVA, ICH, SAH, radiculopathy, spinal cord compression, DVT/PE, ACS, myositis/MSK waste, or infectious source.  Discussed with patient.  Discussed that at this time, her symptoms do not appear to be due to a life-threatening or  emergent cause.  Discussed importance of hydration.  Will discharge with Mobic for pain control.  Strict follow-up with primary care for further evaluation of symptoms.  At this time, patient appears safe for discharge.  Return precautions given.  Patient states she understands and agrees to plan.  Final Clinical Impressions(s) / ED Diagnoses   Final diagnoses:  Left arm pain  Acute pain of left knee    ED Discharge Orders        Ordered    meloxicam (MOBIC) 7.5 MG tablet  Daily     11/03/17 1902       Franchot Heidelberg, PA-C 11/04/17 0001    Long, Wonda Olds, MD 11/04/17 [phone]

## 2017-11-03 NOTE — ED Notes (Signed)
Patient transported to CT 

## 2017-11-25 ENCOUNTER — Telehealth: Payer: Self-pay | Admitting: Emergency Medicine

## 2017-11-25 NOTE — Telephone Encounter (Signed)
Please see message. °

## 2017-11-25 NOTE — Telephone Encounter (Signed)
Copied from [place] [id number]. Topic: Quick Communication - See Telephone Encounter >> Nov 25, 2017 11:58 AM Antonieta Iba C wrote: CRM for notification. See Telephone encounter for: 11/25/17.   Pt called in to make provider aware that [place] is requesting a call in regards to C-Pap machine. She said the phone # is: [phone]   CB: [phone] - for pt

## 2017-11-26 DIAGNOSIS — M1712 Unilateral primary osteoarthritis, left knee: Secondary | ICD-10-CM | POA: Diagnosis not present

## 2017-11-27 DIAGNOSIS — G4733 Obstructive sleep apnea (adult) (pediatric): Secondary | ICD-10-CM | POA: Diagnosis not present

## 2017-11-29 NOTE — Telephone Encounter (Signed)
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  Unable to get in contact with anyone Spoke with [employer] but unable to find out the reason for lack of coverage  Found letter from [place] needing documentation. Faxed over insurance card, sleep study and office visit.

## 2017-11-30 DIAGNOSIS — R51 Headache: Secondary | ICD-10-CM | POA: Diagnosis not present

## 2017-11-30 DIAGNOSIS — G43019 Migraine without aura, intractable, without status migrainosus: Secondary | ICD-10-CM | POA: Diagnosis not present

## 2017-11-30 DIAGNOSIS — Z79899 Other long term (current) drug therapy: Secondary | ICD-10-CM | POA: Diagnosis not present

## 2017-11-30 DIAGNOSIS — Z049 Encounter for examination and observation for unspecified reason: Secondary | ICD-10-CM | POA: Diagnosis not present

## 2017-11-30 NOTE — Telephone Encounter (Signed)
Spoke with [employer] for billing problems for CPAP system. Spoke with rep and patient is billed for $91.38 for 3 months rental of CPAP machine. This is patient co-insurance 30% patient responsible and 70% covered by insurance. Patient will own CPAP machine after payment.  Will advise patient

## 2017-12-03 NOTE — Telephone Encounter (Signed)
LMOVM advising that she was billed by [employer] for the CPAP rental. She is responsible for 30% co-insurance.

## 2017-12-09 DIAGNOSIS — K080 Exfoliation of teeth due to systemic causes: Secondary | ICD-10-CM | POA: Diagnosis not present

## 2017-12-15 ENCOUNTER — Other Ambulatory Visit: Payer: Self-pay | Admitting: Physician Assistant

## 2018-01-04 DIAGNOSIS — K080 Exfoliation of teeth due to systemic causes: Secondary | ICD-10-CM | POA: Diagnosis not present

## 2018-01-28 DIAGNOSIS — N644 Mastodynia: Secondary | ICD-10-CM | POA: Diagnosis not present

## 2018-02-04 ENCOUNTER — Encounter: Payer: Self-pay | Admitting: Physician Assistant

## 2018-02-04 DIAGNOSIS — Z6841 Body Mass Index (BMI) 40.0 and over, adult: Secondary | ICD-10-CM | POA: Diagnosis not present

## 2018-02-04 DIAGNOSIS — Z113 Encounter for screening for infections with a predominantly sexual mode of transmission: Secondary | ICD-10-CM | POA: Diagnosis not present

## 2018-02-04 DIAGNOSIS — Z01419 Encounter for gynecological examination (general) (routine) without abnormal findings: Secondary | ICD-10-CM | POA: Diagnosis not present

## 2018-02-04 DIAGNOSIS — Z1231 Encounter for screening mammogram for malignant neoplasm of breast: Secondary | ICD-10-CM | POA: Diagnosis not present

## 2018-02-04 LAB — HM HIV SCREENING LAB: HM HIV Screening: NEGATIVE

## 2018-02-04 LAB — HM HEPATITIS C SCREENING LAB: HM Hepatitis Screen: NEGATIVE

## 2018-02-04 LAB — HM MAMMOGRAPHY

## 2018-02-07 ENCOUNTER — Encounter: Payer: Self-pay | Admitting: Physician Assistant

## 2018-02-14 DIAGNOSIS — M25542 Pain in joints of left hand: Secondary | ICD-10-CM | POA: Diagnosis not present

## 2018-02-14 DIAGNOSIS — M25562 Pain in left knee: Secondary | ICD-10-CM | POA: Diagnosis not present

## 2018-02-14 DIAGNOSIS — G5612 Other lesions of median nerve, left upper limb: Secondary | ICD-10-CM | POA: Diagnosis not present

## 2018-02-14 DIAGNOSIS — M170 Bilateral primary osteoarthritis of knee: Secondary | ICD-10-CM | POA: Diagnosis not present

## 2018-02-15 ENCOUNTER — Encounter: Payer: Self-pay | Admitting: Physician Assistant

## 2018-02-18 ENCOUNTER — Ambulatory Visit: Payer: Federal, State, Local not specified - PPO | Admitting: Physician Assistant

## 2018-02-18 ENCOUNTER — Encounter: Payer: Self-pay | Admitting: Physician Assistant

## 2018-02-18 ENCOUNTER — Other Ambulatory Visit: Payer: Self-pay

## 2018-02-18 VITALS — BP 122/82 | HR 70 | Temp 97.9°F | Resp 14 | Ht 67.0 in | Wt 230.0 lb

## 2018-02-18 DIAGNOSIS — R7303 Prediabetes: Secondary | ICD-10-CM | POA: Diagnosis not present

## 2018-02-18 LAB — MICROALBUMIN / CREATININE URINE RATIO
Creatinine,U: 65.8 mg/dL
Microalb Creat Ratio: 1.1 mg/g (ref 0.0–30.0)
Microalb, Ur: <0.7 mg/dL (ref 0.0–1.9)

## 2018-02-18 LAB — POCT GLYCOSYLATED HEMOGLOBIN (HGB A1C): HEMOGLOBIN A1C: 6.1 % — AB (ref 4.0–5.6)

## 2018-02-18 LAB — BASIC METABOLIC PANEL
BUN: 8 mg/dL (ref 6–23)
CALCIUM: 9.9 mg/dL (ref 8.4–10.5)
CO2: 30 mEq/L (ref 19–32)
CREATININE: 0.72 mg/dL (ref 0.40–1.20)
Chloride: 104 mEq/L (ref 96–112)
GFR: 107.89 mL/min (ref >60.00)
GLUCOSE: 92 mg/dL (ref 70–99)
Potassium: 4.2 mEq/L (ref 3.5–5.1)
SODIUM: 138 meq/L (ref 135–145)

## 2018-02-18 NOTE — Patient Instructions (Signed)
Please go to the lab today for blood work.  I will call you with your results. We will alter treatment regimen(s) if indicated by your results.   A1C looks good today -- much improved!! Not in diabetic range. Recommend you start a Cinnamon supplement daily as this can continue to help with insulin resistance.   Keep up with diet and exercise. Follow-up in 6 months for repeat assessment.    Preventing Type 2 Diabetes Mellitus Type 2 diabetes (type 2 diabetes mellitus) is a long-term (chronic) disease that affects blood sugar (glucose) levels. Normally, a hormone called insulin allows glucose to enter cells in the body. The cells use glucose for energy. In type 2 diabetes, one or both of these problems may be present:  The body does not make enough insulin.  The body does not respond properly to insulin that it makes (insulin resistance).  Insulin resistance or lack of insulin causes excess glucose to build up in the blood instead of going into cells. As a result, high blood glucose (hyperglycemia) develops, which can cause many complications. Being overweight or obese and having an inactive (sedentary) lifestyle can increase your risk for diabetes. Type 2 diabetes can be delayed or prevented by making certain nutrition and lifestyle changes. What nutrition changes can be made?  Eat healthy meals and snacks regularly. Keep a healthy snack with you for when you get hungry between meals, such as fruit or a handful of nuts.  Eat lean meats and proteins that are low in saturated fats, such as chicken, fish, egg whites, and beans. Avoid processed meats.  Eat plenty of fruits and vegetables and plenty of grains that have not been processed (whole grains). It is recommended that you eat: ? 1?2 cups of fruit every day. ? 2?3 cups of vegetables every day. ? 6?8 oz of whole grains every day, such as oats, whole wheat, bulgur, brown rice, quinoa, and millet.  Eat low-fat dairy products, such as  milk, yogurt, and cheese.  Eat foods that contain healthy fats, such as nuts, avocado, olive oil, and canola oil.  Drink water throughout the day. Avoid drinks that contain added sugar, such as soda or sweet tea.  Follow instructions from your health care provider about specific eating or drinking restrictions.  Control how much food you eat at a time (portion size). ? Check food labels to find out the serving sizes of foods. ? Use a kitchen scale to weigh amounts of foods.  Saute or steam food instead of frying it. Cook with water or broth instead of oils or butter.  Limit your intake of: ? Salt (sodium). Have no more than 1 tsp (2,400 mg) of sodium a day. If you have heart disease or high blood pressure, have less than ? tsp (1,500 mg) of sodium a day. ? Saturated fat. This is fat that is solid at room temperature, such as butter or fat on meat. What lifestyle changes can be made?  Activity  Do moderate-intensity physical activity for at least 30 minutes on at least 5 days of the week, or as much as told by your health care provider.  Ask your health care provider what activities are safe for you. A mix of physical activities may be best, such as walking, swimming, cycling, and strength training.  Try to add physical activity into your day. For example: ? Park in spots that are farther away than usual, so that you walk more. For example, park in a far corner of  the parking lot when you go to the office or the grocery store. ? Take a walk during your lunch break. ? Use stairs instead of elevators or escalators. Weight Loss  Lose weight as directed. Your health care provider can determine how much weight loss is best for you and can help you lose weight safely.  If you are overweight or obese, you may be instructed to lose at least 5?7 % of your body weight. Alcohol and Tobacco   Limit alcohol intake to no more than 1 drink a day for nonpregnant women and 2 drinks a day for  men. One drink equals 12 oz of beer, 5 oz of wine, or 1 oz of hard liquor.  Do not use any tobacco products, such as cigarettes, chewing tobacco, and e-cigarettes. If you need help quitting, ask your health care provider. Work With [place] Provider  Have your blood glucose tested regularly, as told by your health care provider.  Discuss your risk factors and how you can reduce your risk for diabetes.  Get screening tests as told by your health care provider. You may have screening tests regularly, especially if you have certain risk factors for type 2 diabetes.  Make an appointment with a diet and nutrition specialist (registered dietitian). A registered dietitian can help you make a healthy eating plan and can help you understand portion sizes and food labels. Why are these changes important?  It is possible to prevent or delay type 2 diabetes and related health problems by making lifestyle and nutrition changes.  It can be difficult to recognize signs of type 2 diabetes. The best way to avoid possible damage to your body is to take actions to prevent the disease before you develop symptoms. What can happen if changes are not made?  Your blood glucose levels may keep increasing. Having high blood glucose for a long time is dangerous. Too much glucose in your blood can damage your blood vessels, heart, kidneys, nerves, and eyes.  You may develop prediabetes or type 2 diabetes. Type 2 diabetes can lead to many chronic health problems and complications, such as: ? Heart disease. ? Stroke. ? Blindness. ? Kidney disease. ? Depression. ? Poor circulation in the feet and legs, which could lead to surgical removal (amputation) in severe cases. Where to find support:  Ask your health care provider to recommend a registered dietitian, diabetes educator, or weight loss program.  Look for local or online weight loss groups.  Join a gym, fitness club, or outdoor activity group, such  as a walking club. Where to find more information: To learn more about diabetes and diabetes prevention, visit:  American Diabetes Association (ADA): www.diabetes.[employer] of Diabetes and Digestive and Kidney Diseases: FindSpin.nl  To learn more about healthy eating, visit:  The U.S. Department of Agriculture Scientist, research (physical sciences)), Choose My Plate: http://wiley-Obar.com/  Office of Disease Prevention and Health Promotion (ODPHP), Dietary Guidelines: SurferLive.at  Summary  You can reduce your risk for type 2 diabetes by increasing your physical activity, eating healthy foods, and losing weight as directed.  Talk with your health care provider about your risk for type 2 diabetes. Ask about any blood tests or screening tests that you need to have. This information is not intended to replace advice given to you by your health care provider. Make sure you discuss any questions you have with your health care provider. Document Released: 07/22/2015 Document Revised: 09/05/2015 Document Reviewed: 05/21/2015 Elsevier Interactive Patient Education  2018  [employer].

## 2018-02-18 NOTE — Progress Notes (Signed)
History of Present Illness: Patient is a 57 y.o. female who presents to clinic today for follow-up of Diabetes Mellitus II, controlled with diet..  Patient currently on no medications for this. Endorses working hard on diet, cutting back on carbs and increasing vegetable intake. Eating lean protein. Has made sure to drink plenty of water daily.  Denies a regular exercise regimen yet but she is working on this. Notes she is keeping active at work and when watching her grandkids however. Of note she has never had A1C > 6.5. Is up-to-date on eye examination. Declines flu shot today.  Latest Maintenance: A1C --  Lab Results  Component Value Date   HGBA1C 6.1 (A) 02/18/2018   Diabetic Eye Exam -- up-to-date. NO hx of retinopathy.  Urine Microalbumin -- Due. Foot Exam -- due. No concerns.  Past Medical History:  Diagnosis Date  . Allergic rhinitis due to other allergen   . Allergy    seasonal  . Anemia   . Arthritis   . Asthma    mild, intermittent  . Chicken pox   . Cholelithiasis   . CTS (carpal tunnel syndrome)   . Diabetes mellitus without complication ([place])   . Fibroids   . Fibromyalgia   . Frequent urination   . GERD (gastroesophageal reflux disease)   . Headache(784.0)   . Migraine   . PONV (postoperative nausea and vomiting)    difficulty waking up  . Problems related to high-risk sexual behavior    Unprotected sex  . Symptomatic menopausal or female climacteric states     Current Outpatient Medications on File Prior to Visit  Medication Sig Dispense Refill  . Biotin 1000 MCG tablet Take 1,000 mcg by mouth daily.    . cetirizine (ZYRTEC) 10 MG tablet Take 0.5 tablets (5 mg total) by mouth daily. 90 tablet 1  . Cholecalciferol (VITAMIN D-3) 1000 units CAPS Take 1 capsule (1,000 Units total) by mouth daily. 30 capsule 0  . cyclobenzaprine (FLEXERIL) 10 MG tablet cyclobenzaprine 10 mg tablet    . esomeprazole (NEXIUM) 40 MG capsule TAKE 1 CAPSULE BY MOUTH EVERY MORNING  BEFORE BREAKFAST 90 capsule 1  . levalbuterol (XOPENEX HFA) 45 MCG/ACT inhaler INHALE 1- 2 PUFFS BY MOUTH EVERY 6 HOURS AS NEEDED FOR WHEEZING 15 g 0  . Multiple Vitamins-Minerals (MULTIVITAMIN WITH MINERALS) tablet Take 1 tablet by mouth daily. Daily MVI w/Iron; Alternate w/Prenatal MVI    . NON FORMULARY Take 3 tablets by mouth as needed. Swiss Kriss Herbal Laxative Tab    . SUMAtriptan (IMITREX) 100 MG tablet Take 1 tablet (100 mg total) by mouth once for 1 dose. May repeat in 2 hours if headache persists or recurs. 10 tablet 0  . vitamin E 400 UNIT capsule Take 400 Units by mouth daily.    Marland Kitchen zonisamide (ZONEGRAN) 25 MG capsule Take 1 capsule by mouth daily.  5   No current facility-administered medications on file prior to visit.     Allergies  Allergen Reactions  . Iodine Anaphylaxis  . Shellfish Allergy Anaphylaxis  . Latex Rash    Family History  Problem Relation Age of Onset  . Diabetes Mother        Deceased  . Hypertension Mother   . Anemia Mother   . Hyperlipidemia Mother   . Arthritis/Rheumatoid Mother   . Migraines Mother   . CVA Mother   . Hypertension Father        Deceased  . Stroke Father   .  Diabetes Father        Legs Amputated  . Heart disease Father   . Arthritis Father   . Cancer Father   . Heart attack Maternal Aunt   . Diabetes Maternal Uncle   . Migraines Maternal Aunt   . Asthma Maternal Aunt   . Asthma Maternal Uncle   . Stomach cancer Sister   . Breast cancer Sister   . Uterine cancer Sister        or Ovarian  . Sleep apnea Brother   . COPD Brother   . Asthma Sister        x2  . Sudden death Brother        SIDS  . Hypertension Brother   . Diabetes Sister   . Asthma Daughter        x2  . Allergies Daughter   . Sickle cell trait Other        All Children  . Atrial fibrillation Daughter   . Anxiety disorder Daughter   . Allergies Son   . Hypertension Sister   . Heart disease Sister   . Asthma Brother     Social History    Socioeconomic History  . Marital status: Single    Spouse name: Not on file  . Number of children: Not on file  . Years of education: Not on file  . Highest education level: Not on file  Occupational History  . Not on file  Social Needs  . Financial resource strain: Not on file  . Food insecurity:    Worry: Not on file    Inability: Not on file  . Transportation needs:    Medical: Not on file    Non-medical: Not on file  Tobacco Use  . Smoking status: Never Smoker  . Smokeless tobacco: Never Used  Substance and Sexual Activity  . Alcohol use: Yes    Comment: rarely   . Drug use: No  . Sexual activity: Never    Birth control/protection: None, Abstinence, Surgical  Lifestyle  . Physical activity:    Days per week: Not on file    Minutes per session: Not on file  . Stress: Not on file  Relationships  . Social connections:    Talks on phone: Not on file    Gets together: Not on file    Attends religious service: Not on file    Active member of club or organization: Not on file    Attends meetings of clubs or organizations: Not on file    Relationship status: Not on file  Other Topics Concern  . Not on file  Social History Narrative  . Not on file   Review of Systems: Pertinent ROS are listed in HPI  Physical Examination: BP 122/82   Pulse 70   Temp 97.9 F (36.6 C) (Oral)   Resp 14   Ht 5\' 7"  (1.702 m)   Wt 230 lb (104.3 kg)   SpO2 99%   BMI 36.02 kg/m  General appearance: alert, cooperative, appears stated age and no distress Head: Normocephalic, without obvious abnormality, atraumatic Lungs: clear to auscultation bilaterally Heart: regular rate and rhythm, S1, S2 normal, no murmur, click, rub or gallop Extremities: extremities normal, atraumatic, no cyanosis or edema Pulses: 2+ and symmetric Skin: Skin color, texture, turgor normal. No rashes or lesions Neurologic: Alert and oriented X 3, normal strength and tone. Normal symmetric reflexes. Normal  coordination and gait  Diabetic Foot Form - Detailed   Diabetic Foot Exam -  detailed Diabetic Foot exam was performed with the following findings:  Yes 02/18/2018 10:47 AM  Visual Foot Exam completed.:  Yes  Can the patient see the bottom of their feet?:  Yes Are the shoes appropriate in style and fit?:  Yes Is there swelling or and abnormal foot shape?:  No Is there a claw toe deformity?:  No Is there elevated skin temparature?:  No Is there foot or ankle muscle weakness?:  No Normal Range of Motion:  Yes Pulse Foot Exam completed.:  Yes  Right posterior Tibialias:  Present Left posterior Tibialias:  Present  Right Dorsalis Pedis:  Present Left Dorsalis Pedis:  Present  Sensory Foot Exam Completed.:  Yes Semmes-Weinstein Monofilament Test R Site 1-Great Toe:  Pos L Site 1-Great Toe:  Pos       Assessment/Plan: Pre-diabetes A1C today at 6.1 without medication. Continue dietary changes. She has been encouraged to work harder of exercise. Start a cinnamon supplement. Eye exam up-to-date. Foot exam today without abnormal findings. Will check BMP and microalbumin today. Follow-up 6 months.

## 2018-02-18 NOTE — Assessment & Plan Note (Signed)
A1C today at 6.1 without medication. Continue dietary changes. She has been encouraged to work harder of exercise. Start a cinnamon supplement. Eye exam up-to-date. Foot exam today without abnormal findings. Will check BMP and microalbumin today. Follow-up 6 months.

## 2018-03-02 ENCOUNTER — Encounter: Payer: Self-pay | Admitting: Emergency Medicine

## 2018-05-30 DIAGNOSIS — G4733 Obstructive sleep apnea (adult) (pediatric): Secondary | ICD-10-CM | POA: Diagnosis not present

## 2018-06-07 ENCOUNTER — Other Ambulatory Visit: Payer: Self-pay | Admitting: Physician Assistant

## 2018-06-07 ENCOUNTER — Other Ambulatory Visit: Payer: Self-pay

## 2018-06-07 ENCOUNTER — Ambulatory Visit: Payer: Federal, State, Local not specified - PPO | Admitting: Physician Assistant

## 2018-06-07 ENCOUNTER — Encounter: Payer: Self-pay | Admitting: Physician Assistant

## 2018-06-07 VITALS — BP 122/82 | HR 82 | Temp 98.3°F | Resp 16 | Ht 67.0 in | Wt 230.0 lb

## 2018-06-07 DIAGNOSIS — B9789 Other viral agents as the cause of diseases classified elsewhere: Secondary | ICD-10-CM

## 2018-06-07 DIAGNOSIS — J069 Acute upper respiratory infection, unspecified: Secondary | ICD-10-CM

## 2018-06-07 MED ORDER — LEVALBUTEROL TARTRATE 45 MCG/ACT IN AERO: INHALATION_SPRAY | RESPIRATORY_TRACT | 0 refills | Status: DC

## 2018-06-07 MED ORDER — HYDROCOD POLST-CPM POLST ER 10-8 MG/5ML PO SUER: 5.0000 mL | Freq: Two times a day (BID) | ORAL | 0 refills | Status: DC | PRN

## 2018-06-07 NOTE — Patient Instructions (Signed)
Please keep well-hydrated and get plenty of rest.  Start a saline nasal rinse daily. Restart your Xopenex. Use the Tussionex as directed for cough.     Upper Respiratory Infection, Adult An upper respiratory infection (URI) affects the nose, throat, and upper air passages. URIs are caused by germs (viruses). The most common type of URI is often called "the common cold." Medicines cannot cure URIs, but you can do things at home to relieve your symptoms. URIs usually get better within 7-10 days. Follow these instructions at home: Activity  Rest as needed.  If you have a fever, stay home from work or school until your fever is gone, or until your doctor says you may return to work or school. ? You should stay home until you cannot spread the infection anymore (you are not contagious). ? Your doctor may have you wear a face mask so you have less risk of spreading the infection. Relieving symptoms  Gargle with a salt-water mixture 3-4 times a day or as needed. To make a salt-water mixture, completely dissolve -1 tsp of salt in 1 cup of warm water.  Use a cool-mist humidifier to add moisture to the air. This can help you breathe more easily. Eating and drinking   Drink enough fluid to keep your pee (urine) pale yellow.  Eat soups and other clear broths. General instructions   Take over-the-counter and prescription medicines only as told by your doctor. These include cold medicines, fever reducers, and cough suppressants.  Do not use any products that contain nicotine or tobacco. These include cigarettes and e-cigarettes. If you need help quitting, ask your doctor.  Avoid being where people are smoking (avoid secondhand smoke).  Make sure you get regular shots and get the flu shot every year.  Keep all follow-up visits as told by your doctor. This is important. How to avoid spreading infection to others   Wash your hands often with soap and water. If you do not have soap and  water, use hand sanitizer.  Avoid touching your mouth, face, eyes, or nose.  Cough or sneeze into a tissue or your sleeve or elbow. Do not cough or sneeze into your hand or into the air. Contact a doctor if:  You are getting worse, not better.  You have any of these: ? A fever. ? Chills. ? Brown or red mucus in your nose. ? Yellow or brown fluid (discharge)coming from your nose. ? Pain in your face, especially when you bend forward. ? Swollen neck glands. ? Pain with swallowing. ? White areas in the back of your throat. Get help right away if:  You have shortness of breath that gets worse.  You have very bad or constant: ? Headache. ? Ear pain. ? Pain in your forehead, behind your eyes, and over your cheekbones (sinus pain). ? Chest pain.  You have long-lasting (chronic) lung disease along with any of these: ? Wheezing. ? Long-lasting cough. ? Coughing up blood. ? A change in your usual mucus.  You have a stiff neck.  You have changes in your: ? Vision. ? Hearing. ? Thinking. ? Mood. Summary  An upper respiratory infection (URI) is caused by a germ called a virus. The most common type of URI is often called "the common cold."  URIs usually get better within 7-10 days.  Take over-the-counter and prescription medicines only as told by your doctor. This information is not intended to replace advice given to you by your health care provider. Make  sure you discuss any questions you have with your health care provider. Document Released: 09/16/2007 Document Revised: 11/20/2016 Document Reviewed: 11/20/2016 Elsevier Interactive Patient Education  2019 [employer].

## 2018-06-07 NOTE — Progress Notes (Signed)
Patient presents to clinic today c/o < 48 hours of nasal congestion, dry cough with some mild chest congestion. Denies fever, chills, sinus pain, ear pain, tooth pain, chest pain or SOB. Notes cough is affecting sleep. Denies recent travel or sick contact. Has taken some OTC herbal remedies today.   Past Medical History:  Diagnosis Date  . Allergic rhinitis due to other allergen   . Allergy    seasonal  . Anemia   . Arthritis   . Asthma    mild, intermittent  . Chicken pox   . Cholelithiasis   . CTS (carpal tunnel syndrome)   . Diabetes mellitus without complication ([place])   . Fibroids   . Fibromyalgia   . Frequent urination   . GERD (gastroesophageal reflux disease)   . Headache(784.0)   . Migraine   . PONV (postoperative nausea and vomiting)    difficulty waking up  . Problems related to high-risk sexual behavior    Unprotected sex  . Symptomatic menopausal or female climacteric states     Current Outpatient Medications on File Prior to Visit  Medication Sig Dispense Refill  . Biotin 1000 MCG tablet Take 1,000 mcg by mouth daily.    . cetirizine (ZYRTEC) 10 MG tablet Take 0.5 tablets (5 mg total) by mouth daily. 90 tablet 1  . Cholecalciferol (VITAMIN D-3) 1000 units CAPS Take 1 capsule (1,000 Units total) by mouth daily. 30 capsule 0  . cyclobenzaprine (FLEXERIL) 10 MG tablet cyclobenzaprine 10 mg tablet    . esomeprazole (NEXIUM) 40 MG capsule TAKE 1 CAPSULE BY MOUTH EVERY MORNING BEFORE BREAKFAST 90 capsule 1  . Multiple Vitamins-Minerals (MULTIVITAMIN WITH MINERALS) tablet Take 1 tablet by mouth daily. Daily MVI w/Iron; Alternate w/Prenatal MVI    . NON FORMULARY Take 3 tablets by mouth as needed. Swiss Kriss Herbal Laxative Tab    . SUMAtriptan (IMITREX) 100 MG tablet Take 1 tablet (100 mg total) by mouth once for 1 dose. May repeat in 2 hours if headache persists or recurs. 10 tablet 0  . vitamin E 400 UNIT capsule Take 400 Units by mouth daily.    Marland Kitchen zonisamide  (ZONEGRAN) 25 MG capsule Take 1 capsule by mouth daily.  5  . levalbuterol (XOPENEX HFA) 45 MCG/ACT inhaler INHALE 1- 2 PUFFS BY MOUTH EVERY 6 HOURS AS NEEDED FOR WHEEZING (Patient not taking: Reported on 06/07/2018) 15 g 0   No current facility-administered medications on file prior to visit.     Allergies  Allergen Reactions  . Iodine Anaphylaxis  . Shellfish Allergy Anaphylaxis  . Latex Rash    Family History  Problem Relation Age of Onset  . Diabetes Mother        Deceased  . Hypertension Mother   . Anemia Mother   . Hyperlipidemia Mother   . Arthritis/Rheumatoid Mother   . Migraines Mother   . CVA Mother   . Hypertension Father        Deceased  . Stroke Father   . Diabetes Father        Legs Amputated  . Heart disease Father   . Arthritis Father   . Cancer Father   . Heart attack Maternal Aunt   . Diabetes Maternal Uncle   . Migraines Maternal Aunt   . Asthma Maternal Aunt   . Asthma Maternal Uncle   . Stomach cancer Sister   . Breast cancer Sister   . Uterine cancer Sister        or Ovarian  .  Sleep apnea Brother   . COPD Brother   . Asthma Sister        x2  . Sudden death Brother        SIDS  . Hypertension Brother   . Diabetes Sister   . Asthma Daughter        x2  . Allergies Daughter   . Sickle cell trait Other        All Children  . Atrial fibrillation Daughter   . Anxiety disorder Daughter   . Allergies Son   . Hypertension Sister   . Heart disease Sister   . Asthma Brother     Social History   Socioeconomic History  . Marital status: Single    Spouse name: Not on file  . Number of children: Not on file  . Years of education: Not on file  . Highest education level: Not on file  Occupational History  . Not on file  Social Needs  . Financial resource strain: Not on file  . Food insecurity:    Worry: Not on file    Inability: Not on file  . Transportation needs:    Medical: Not on file    Non-medical: Not on file  Tobacco Use  .  Smoking status: Never Smoker  . Smokeless tobacco: Never Used  Substance and Sexual Activity  . Alcohol use: Yes    Comment: rarely   . Drug use: No  . Sexual activity: Never    Birth control/protection: None, Abstinence, Surgical  Lifestyle  . Physical activity:    Days per week: Not on file    Minutes per session: Not on file  . Stress: Not on file  Relationships  . Social connections:    Talks on phone: Not on file    Gets together: Not on file    Attends religious service: Not on file    Active member of club or organization: Not on file    Attends meetings of clubs or organizations: Not on file    Relationship status: Not on file  Other Topics Concern  . Not on file  Social History Narrative  . Not on file   Review of Systems - See HPI.  All other ROS are negative.  BP 122/82   Pulse 82   Temp 98.3 F (36.8 C) (Oral)   Resp 16   Ht 5\' 7"  (1.702 m)   Wt 230 lb (104.3 kg)   SpO2 98%   BMI 36.02 kg/m   Physical Exam Vitals signs reviewed.  Constitutional:      Appearance: Normal appearance.  HENT:     Head: Normocephalic and atraumatic.     Right Ear: Tympanic membrane normal.     Left Ear: Tympanic membrane normal.     Nose: Rhinorrhea present. No congestion.     Mouth/Throat:     Mouth: Mucous membranes are moist.     Pharynx: Oropharynx is clear. No posterior oropharyngeal erythema.  Eyes:     Conjunctiva/sclera: Conjunctivae normal.  Neck:     Musculoskeletal: Neck supple.  Cardiovascular:     Rate and Rhythm: Normal rate and regular rhythm.     Pulses: Normal pulses.     Heart sounds: Normal heart sounds.  Pulmonary:     Effort: Pulmonary effort is normal.     Breath sounds: Normal breath sounds.  Lymphadenopathy:     Cervical: No cervical adenopathy.  Neurological:     Mental Status: She is alert.  Psychiatric:  Mood and Affect: Mood normal.      Assessment/Plan: 1. Viral URI with cough Mild symptoms. Afebrile. Lungs CTAB.  Supportive measures and OTC medications reviewed. Refilled her Xopenex. Rx Tussionex for cough. Return precautions reviewed with patient.  - chlorpheniramine-HYDROcodone (TUSSIONEX PENNKINETIC ER) 10-8 MG/5ML SUER; Take 5 mLs by mouth every 12 (twelve) hours as needed.  Dispense: 140 mL; Refill: 0 - levalbuterol (XOPENEX HFA) 45 MCG/ACT inhaler; INHALE 1- 2 PUFFS BY MOUTH EVERY 6 HOURS AS NEEDED FOR WHEEZING  Dispense: 15 g; Refill: 0   Leeanne Rio, PA-C

## 2018-06-08 ENCOUNTER — Other Ambulatory Visit: Payer: Self-pay | Admitting: Physician Assistant

## 2018-06-08 DIAGNOSIS — B9789 Other viral agents as the cause of diseases classified elsewhere: Principal | ICD-10-CM

## 2018-06-08 DIAGNOSIS — J069 Acute upper respiratory infection, unspecified: Secondary | ICD-10-CM

## 2018-08-19 ENCOUNTER — Ambulatory Visit (INDEPENDENT_AMBULATORY_CARE_PROVIDER_SITE_OTHER): Payer: Federal, State, Local not specified - PPO | Admitting: Physician Assistant

## 2018-08-19 ENCOUNTER — Encounter: Payer: Self-pay | Admitting: Physician Assistant

## 2018-08-19 ENCOUNTER — Other Ambulatory Visit: Payer: Self-pay

## 2018-08-19 VITALS — Ht 67.0 in | Wt 236.0 lb

## 2018-08-19 DIAGNOSIS — R7303 Prediabetes: Secondary | ICD-10-CM | POA: Diagnosis not present

## 2018-08-19 DIAGNOSIS — E669 Obesity, unspecified: Secondary | ICD-10-CM

## 2018-08-19 NOTE — Progress Notes (Signed)
Virtual Visit via Video   I connected with patient on 08/19/18 at  8:00 AM EDT by a video enabled telemedicine application and verified that I am speaking with the correct person using two identifiers.  Location patient: Home Location provider: [facility] Persons participating in the virtual visit: Patient, Provider, [place] (Patina Moore)  I discussed the limitations of evaluation and management by telemedicine and the availability of in person appointments. The patient expressed understanding and agreed to proceed.  Subjective:   HPI:   Patient presents today via [facility] for follow-up of pre-diabetes and obesity.   Lab Results  Component Value Date   HGBA1C 6.1 (A) 02/18/2018   Patient endorses working hard on diet and exercise. Is watching portion sizes and carbohydrates. Eating mostly plant-based protein. Has been walking the dog daily for about 30 minutes+ per day. Also walks quite a bit daily at work. Has noted clothes are fitting much better. Denies polydipsia, polyphagia or polyuria. Is sleeping very well and notes very restorative sleep.   ROS:   See pertinent positives and negatives per HPI.  Patient Active Problem List   Diagnosis Date Noted  . Pre-diabetes 02/18/2018  . OSA (obstructive sleep apnea) 05/28/2017  . Family history of breast cancer 12/25/2015  . Migraines 09/28/2013  . Visit for preventive health examination 09/10/2013  . GERD (gastroesophageal reflux disease) 03/19/2013  . Seasonal allergies 03/19/2013  . Neck pain, chronic 03/19/2013  . IBS (irritable bowel syndrome) 03/19/2013    Social History   Tobacco Use  . Smoking status: Never Smoker  . Smokeless tobacco: Never Used  Substance Use Topics  . Alcohol use: Yes    Comment: rarely     Current Outpatient Medications:  .  Biotin 1000 MCG tablet, Take 1,000 mcg by mouth daily., Disp: , Rfl:  .  cetirizine (ZYRTEC) 10 MG tablet, Take 0.5 tablets (5 mg total) by mouth daily.,  Disp: 90 tablet, Rfl: 1 .  Cholecalciferol (VITAMIN D-3) 1000 units CAPS, Take 1 capsule (1,000 Units total) by mouth daily., Disp: 30 capsule, Rfl: 0 .  cyclobenzaprine (FLEXERIL) 10 MG tablet, cyclobenzaprine 10 mg tablet, Disp: , Rfl:  .  esomeprazole (NEXIUM) 40 MG capsule, TAKE 1 CAPSULE BY MOUTH EVERY MORNING BEFORE BREAKFAST, Disp: 90 capsule, Rfl: 1 .  levalbuterol (XOPENEX HFA) 45 MCG/ACT inhaler, INHALE 1 TO 2 PUFFS BY MOUTH EVERY 6 HOURS AS NEEDED FOR WHEEZING, Disp: 15 g, Rfl: 5 .  Multiple Vitamins-Minerals (MULTIVITAMIN WITH MINERALS) tablet, Take 1 tablet by mouth daily. Daily MVI w/Iron; Alternate w/Prenatal MVI, Disp: , Rfl:  .  NON FORMULARY, Take 3 tablets by mouth as needed. Swiss Kriss Herbal Laxative Tab, Disp: , Rfl:  .  SUMAtriptan (IMITREX) 100 MG tablet, Take 1 tablet (100 mg total) by mouth once for 1 dose. May repeat in 2 hours if headache persists or recurs., Disp: 10 tablet, Rfl: 0 .  vitamin E 400 UNIT capsule, Take 400 Units by mouth daily., Disp: , Rfl:  .  zonisamide (ZONEGRAN) 25 MG capsule, Take 1 capsule by mouth daily., Disp: , Rfl: 5  Allergies  Allergen Reactions  . Iodine Anaphylaxis  . Shellfish Allergy Anaphylaxis  . Latex Rash    Objective:   Ht 5\' 7"  (1.702 m)   Wt 236 lb (107 kg)   BMI 36.96 kg/m   Patient is well-developed, well-nourished in no acute distress.  Resting comfortably in chair at home.  Head is normocephalic, atraumatic.  No labored breathing.  Speech is clear and coherent with logical contest.  Patient is alert and oriented at baseline.   Assessment and Plan:   1. Pre-diabetes 2. Obesity (BMI 30-39.9) Working hard on diet and exercise. Notes improved fitting of clothing. Impressed by how hard she is working. Recommend that she make sure she is getting plenty of plant-based protein and starting [employer]. Will have her come in soon for repeat A1C. She wants to hold off for now until things calm down with COVID pandemic.       Leeanne Rio, PA-C 08/19/2018

## 2018-08-19 NOTE — Progress Notes (Signed)
I have discussed the procedure for the virtual visit with the patient who has given consent to proceed with assessment and treatment.   Deandra Goering S Konnie Noffsinger, CMA     

## 2018-08-30 ENCOUNTER — Other Ambulatory Visit: Payer: Self-pay | Admitting: Physician Assistant

## 2018-08-30 DIAGNOSIS — K219 Gastro-esophageal reflux disease without esophagitis: Secondary | ICD-10-CM

## 2018-09-27 ENCOUNTER — Encounter: Payer: Self-pay | Admitting: Physician Assistant

## 2018-09-27 MED ORDER — CYCLOBENZAPRINE HCL 10 MG PO TABS
ORAL_TABLET | ORAL | 0 refills | Status: DC
Start: 2018-09-27 — End: 2023-11-24

## 2018-09-27 MED ORDER — CETIRIZINE HCL 10 MG PO TABS: 10.0000 mg | ORAL_TABLET | Freq: Every day | ORAL | 1 refills | Status: DC

## 2018-09-29 ENCOUNTER — Other Ambulatory Visit: Payer: Self-pay

## 2018-09-29 ENCOUNTER — Encounter: Payer: Self-pay | Admitting: Orthopedic Surgery

## 2018-09-29 ENCOUNTER — Ambulatory Visit (INDEPENDENT_AMBULATORY_CARE_PROVIDER_SITE_OTHER): Payer: Federal, State, Local not specified - PPO | Admitting: Orthopedic Surgery

## 2018-09-29 ENCOUNTER — Ambulatory Visit: Payer: Self-pay

## 2018-09-29 VITALS — Ht 67.0 in | Wt 236.0 lb

## 2018-09-29 DIAGNOSIS — M25562 Pain in left knee: Secondary | ICD-10-CM | POA: Diagnosis not present

## 2018-09-29 MED ORDER — METHYLPREDNISOLONE ACETATE 40 MG/ML IJ SUSP
40.0000 mg | INTRAMUSCULAR | Status: AC | PRN
  Administered 2018-09-29: 40 mg via INTRA_ARTICULAR

## 2018-09-29 MED ORDER — LIDOCAINE HCL 1 % IJ SOLN
5.0000 mL | INTRAMUSCULAR | Status: AC | PRN
  Administered 2018-09-29: 5 mL

## 2018-09-29 NOTE — Progress Notes (Signed)
Office Visit Note   Patient: Natalie Vazquez           Date of Birth: 1960/12/16           MRN: [id number] Visit Date: 09/29/2018              Requested by: Brunetta Jeans, PA-C [id number] [place],  [place] [id number] PCP: Brunetta Jeans, PA-C  Chief Complaint  Patient presents with  . Left Knee - Pain      HPI: Patient is a 58 year old woman who states she has had a partial medial meniscectomy in the past.  Patient states that 2 days ago she felt something pop in the back of her knee.  She states she was walking upstairs.  Patient states she has tightness and is currently taking ibuprofen.  Assessment & Plan: Visit Diagnoses:  1. Left knee pain, unspecified chronicity     Plan: Left knee was injected she tolerated this well recommended strengthening exercises.  Follow-Up Instructions: Return if symptoms worsen or fail to improve.   Ortho Exam  Patient is alert, oriented, no adenopathy, well-dressed, normal affect, normal respiratory effort. Examination patient has an antalgic gait there is no effusion of the left knee collaterals and cruciates are stable.  She has minimally tender to palpation of the medial joint line lateral joint line is nontender to palpation patellofemoral joint line is nontender to palpation there is no crepitation with range of motion.  There is no Baker's cyst no tenderness to palpation of the popliteal fossa.  Imaging: Xr Knee 1-2 Views Left  Result Date: 09/29/2018 2 view radiographs of the left knee shows some joint space narrowing medially subchondral sclerosis a osteophytic bone spur medially with no spurs in the patellofemoral joint.  No images are attached to the encounter.  Labs: Lab Results  Component Value Date   HGBA1C 6.1 (A) 02/18/2018   HGBA1C 6.5 05/28/2017   HGBA1C 6.4 05/22/2016   ESRSEDRATE 1 09/07/2013   LABORGA CORYNEBACTERIUM SPECIES 01/09/2016     Lab Results  Component Value Date   ALBUMIN 3.7 11/03/2017    ALBUMIN 4.4 05/28/2017   ALBUMIN 4.0 05/22/2016    Body mass index is 36.96 kg/m.  Orders:  Orders Placed This Encounter  Procedures  . XR Knee 1-2 Views Left   No orders of the defined types were placed in this encounter.    Procedures: Large Joint Inj: L knee on 09/29/2018 9:13 AM Indications: pain and diagnostic evaluation Details: 22 G 1.5 in needle, anteromedial approach  Arthrogram: No  Medications: 5 mL lidocaine 1 %; 40 mg methylPREDNISolone acetate 40 MG/ML Outcome: tolerated well, no immediate complications Procedure, treatment alternatives, risks and benefits explained, specific risks discussed. Consent was given by the patient. Immediately prior to procedure a time out was called to verify the correct patient, procedure, equipment, support staff and site/side marked as required. Patient was prepped and draped in the usual sterile fashion.      Clinical Data: No additional findings.  ROS:  All other systems negative, except as noted in the HPI. Review of Systems  Objective: Vital Signs: Ht 5\' 7"  (1.702 m)   Wt 236 lb (107 kg)   BMI 36.96 kg/m   Specialty Comments:  No specialty comments available.  PMFS History: Patient Active Problem List   Diagnosis Date Noted  . Obesity (BMI 30-39.9) 08/19/2018  . Pre-diabetes 02/18/2018  . OSA (obstructive sleep apnea) 05/28/2017  . Family history of  breast cancer 12/25/2015  . Migraines 09/28/2013  . Visit for preventive health examination 09/10/2013  . GERD (gastroesophageal reflux disease) 03/19/2013  . Seasonal allergies 03/19/2013  . Neck pain, chronic 03/19/2013  . IBS (irritable bowel syndrome) 03/19/2013   Past Medical History:  Diagnosis Date  . Allergic rhinitis due to other allergen   . Allergy    seasonal  . Anemia   . Arthritis   . Asthma    mild, intermittent  . Chicken pox   . Cholelithiasis   . CTS (carpal tunnel syndrome)   . Diabetes mellitus without complication ([facility])   .  Fibroids   . Fibromyalgia   . Frequent urination   . GERD (gastroesophageal reflux disease)   . Headache(784.0)   . Migraine   . PONV (postoperative nausea and vomiting)    difficulty waking up  . Problems related to high-risk sexual behavior    Unprotected sex  . Symptomatic menopausal or female climacteric states     Family History  Problem Relation Age of Onset  . Diabetes Mother        Deceased  . Hypertension Mother   . Anemia Mother   . Hyperlipidemia Mother   . Arthritis/Rheumatoid Mother   . Migraines Mother   . CVA Mother   . Hypertension Father        Deceased  . Stroke Father   . Diabetes Father        Legs Amputated  . Heart disease Father   . Arthritis Father   . Cancer Father   . Heart attack Maternal Aunt   . Diabetes Maternal Uncle   . Migraines Maternal Aunt   . Asthma Maternal Aunt   . Asthma Maternal Uncle   . Stomach cancer Sister   . Breast cancer Sister   . Uterine cancer Sister        or Ovarian  . Sleep apnea Brother   . COPD Brother   . Asthma Sister        x2  . Sudden death Brother        SIDS  . Hypertension Brother   . Diabetes Sister   . Asthma Daughter        x2  . Allergies Daughter   . Sickle cell trait Other        All Children  . Atrial fibrillation Daughter   . Anxiety disorder Daughter   . Allergies Son   . Hypertension Sister   . Heart disease Sister   . Asthma Brother     Past Surgical History:  Procedure Laterality Date  . ABDOMINAL HYSTERECTOMY  2008   partial, ovaries remain  . CARPAL TUNNEL RELEASE Right 2015  . CHOLECYSTECTOMY  05/19/2011   Procedure: LAPAROSCOPIC CHOLECYSTECTOMY;  Surgeon: Zenovia Jarred, MD;  Location: [facility];  Service: General;  Laterality: N/A;  . LASER ABLATION [place] / VULVAR    . PERCUTANEOUS PINNING  01/22/2012   Procedure: PERCUTANEOUS PINNING EXTREMITY;  Surgeon: Marybelle Killings, MD;  Location: [facility];  Service: Orthopedics;  Laterality: Right;  Closed Reduction and  Pinning right 5th Metacarpal Fracture  . TUBAL LIGATION    . WISDOM TOOTH EXTRACTION     Social History   Occupational History  . Not on file  Tobacco Use  . Smoking status: Never Smoker  . Smokeless tobacco: Never Used  Substance and Sexual Activity  . Alcohol use: Yes    Comment: rarely   . Drug use: No  .  Sexual activity: Never    Birth control/protection: None, Abstinence, Surgical

## 2018-10-12 ENCOUNTER — Other Ambulatory Visit: Payer: Self-pay

## 2018-10-12 DIAGNOSIS — Z20822 Contact with and (suspected) exposure to covid-19: Secondary | ICD-10-CM

## 2018-10-17 LAB — NOVEL CORONAVIRUS, NAA: SARS-CoV-2, NAA: NOT DETECTED

## 2018-10-20 ENCOUNTER — Encounter: Payer: Self-pay | Admitting: Physician Assistant

## 2018-12-13 ENCOUNTER — Telehealth: Payer: Self-pay | Admitting: General Practice

## 2018-12-13 NOTE — Telephone Encounter (Signed)
PA approved in covermymeds today.

## 2018-12-23 ENCOUNTER — Encounter: Payer: Self-pay | Admitting: Physician Assistant

## 2018-12-29 ENCOUNTER — Other Ambulatory Visit: Payer: Self-pay | Admitting: Physician Assistant

## 2018-12-29 DIAGNOSIS — J069 Acute upper respiratory infection, unspecified: Secondary | ICD-10-CM

## 2019-01-06 ENCOUNTER — Encounter: Payer: Self-pay | Admitting: Physician Assistant

## 2019-01-06 NOTE — Telephone Encounter (Signed)
I do not prescribe her zonisamide. She would need to follow-up with her specialist.

## 2019-01-06 NOTE — Telephone Encounter (Signed)
Patient would need appt to discuss and determine need for testing.

## 2019-01-13 DIAGNOSIS — G43019 Migraine without aura, intractable, without status migrainosus: Secondary | ICD-10-CM | POA: Diagnosis not present

## 2019-01-13 DIAGNOSIS — R519 Headache, unspecified: Secondary | ICD-10-CM | POA: Diagnosis not present

## 2019-01-19 DIAGNOSIS — G5600 Carpal tunnel syndrome, unspecified upper limb: Secondary | ICD-10-CM | POA: Diagnosis not present

## 2019-01-27 DIAGNOSIS — H93293 Other abnormal auditory perceptions, bilateral: Secondary | ICD-10-CM | POA: Diagnosis not present

## 2019-01-27 DIAGNOSIS — H9313 Tinnitus, bilateral: Secondary | ICD-10-CM | POA: Diagnosis not present

## 2019-02-01 DIAGNOSIS — M5432 Sciatica, left side: Secondary | ICD-10-CM | POA: Diagnosis not present

## 2019-02-01 DIAGNOSIS — M25562 Pain in left knee: Secondary | ICD-10-CM | POA: Diagnosis not present

## 2019-02-06 ENCOUNTER — Other Ambulatory Visit: Payer: Self-pay | Admitting: Otolaryngology

## 2019-02-06 DIAGNOSIS — H93A9 Pulsatile tinnitus, unspecified ear: Secondary | ICD-10-CM

## 2019-02-10 ENCOUNTER — Ambulatory Visit: Payer: Federal, State, Local not specified - PPO | Admitting: Physician Assistant

## 2019-02-10 ENCOUNTER — Other Ambulatory Visit: Payer: Self-pay

## 2019-02-10 ENCOUNTER — Encounter: Payer: Self-pay | Admitting: Physician Assistant

## 2019-02-10 VITALS — BP 124/80 | HR 68 | Temp 98.0°F | Resp 14 | Ht 67.0 in | Wt 232.0 lb

## 2019-02-10 DIAGNOSIS — Z1389 Encounter for screening for other disorder: Secondary | ICD-10-CM

## 2019-02-10 DIAGNOSIS — R7303 Prediabetes: Secondary | ICD-10-CM

## 2019-02-10 DIAGNOSIS — E669 Obesity, unspecified: Secondary | ICD-10-CM | POA: Diagnosis not present

## 2019-02-10 DIAGNOSIS — K219 Gastro-esophageal reflux disease without esophagitis: Secondary | ICD-10-CM

## 2019-02-10 LAB — LIPID PANEL
Cholesterol: 137 mg/dL (ref 0–200)
HDL: 52.3 mg/dL (ref >39.00)
LDL Cholesterol: 72 mg/dL (ref 0–99)
NonHDL: 84.48
Total CHOL/HDL Ratio: 3
Triglycerides: 62 mg/dL (ref 0.0–149.0)
VLDL: 12.4 mg/dL (ref 0.0–40.0)

## 2019-02-10 LAB — COMPREHENSIVE METABOLIC PANEL
ALT: 28 U/L (ref 0–35)
AST: 24 U/L (ref 0–37)
Albumin: 4.1 g/dL (ref 3.5–5.2)
Alkaline Phosphatase: 58 U/L (ref 39–117)
BUN: 8 mg/dL (ref 6–23)
CO2: 25 mEq/L (ref 19–32)
Calcium: 9 mg/dL (ref 8.4–10.5)
Chloride: 104 mEq/L (ref 96–112)
Creatinine, Ser: 0.67 mg/dL (ref 0.40–1.20)
GFR: 109.91 mL/min (ref >60.00)
Glucose, Bld: 110 mg/dL — ABNORMAL HIGH (ref 70–99)
Potassium: 4.1 mEq/L (ref 3.5–5.1)
Sodium: 137 mEq/L (ref 135–145)
Total Bilirubin: 0.3 mg/dL (ref 0.2–1.2)
Total Protein: 7.2 g/dL (ref 6.0–8.3)

## 2019-02-10 LAB — CBC WITH DIFFERENTIAL/PLATELET
Basophils Absolute: 0 10*3/uL (ref 0.0–0.1)
Basophils Relative: 0.7 % (ref 0.0–3.0)
Eosinophils Absolute: 0.2 10*3/uL (ref 0.0–0.7)
Eosinophils Relative: 2.9 % (ref 0.0–5.0)
HCT: 36.9 % (ref 36.0–46.0)
Hemoglobin: 11.9 g/dL — ABNORMAL LOW (ref 12.0–15.0)
Lymphocytes Relative: 43.5 % (ref 12.0–46.0)
Lymphs Abs: 2.2 10*3/uL (ref 0.7–4.0)
MCHC: 32.3 g/dL (ref 30.0–36.0)
MCV: 77.9 fl — ABNORMAL LOW (ref 78.0–100.0)
Monocytes Absolute: 0.4 10*3/uL (ref 0.1–1.0)
Monocytes Relative: 8.3 % (ref 3.0–12.0)
Neutro Abs: 2.3 10*3/uL (ref 1.4–7.7)
Neutrophils Relative %: 44.6 % (ref 43.0–77.0)
Platelets: 229 10*3/uL (ref 150.0–400.0)
RBC: 4.74 Mil/uL (ref 3.87–5.11)
RDW: 15.2 % (ref 11.5–15.5)
WBC: 5.2 10*3/uL (ref 4.0–10.5)

## 2019-02-10 LAB — POCT URINALYSIS DIPSTICK
Bilirubin, UA: NEGATIVE
Blood, UA: POSITIVE
Glucose, UA: NEGATIVE
Ketones, UA: NEGATIVE
Leukocytes, UA: NEGATIVE
Nitrite, UA: NEGATIVE
Protein, UA: NEGATIVE
Spec Grav, UA: 1.025 (ref 1.010–1.025)
Urobilinogen, UA: 0.2 E.U./dL
pH, UA: 6 (ref 5.0–8.0)

## 2019-02-10 LAB — MICROALBUMIN / CREATININE URINE RATIO
Creatinine,U: 217.6 mg/dL
Microalb Creat Ratio: 0.5 mg/g (ref 0.0–30.0)
Microalb, Ur: 1.2 mg/dL (ref 0.0–1.9)

## 2019-02-10 LAB — HEMOGLOBIN A1C: Hgb A1c MFr Bld: 6.4 % (ref 4.6–6.5)

## 2019-02-10 LAB — URINALYSIS, MICROSCOPIC ONLY

## 2019-02-10 LAB — TSH: TSH: 0.65 u[IU]/mL (ref 0.35–4.50)

## 2019-02-10 NOTE — Progress Notes (Signed)
Patient presents to clinic today c/o pre-diabetes. Feeling well, states she is working on her diet but notes some difficulty maintaining a well-balanced diet due to demanding work schedule. Eating 1-2 meals per day, protein smoothies, chicken, vegetables; keeps nuts and fruits on her for snacks. Staying well hydrated, carries water bottle with her. Walking throughout the day at work, no formal activity. Occasional difficulty falling asleep when she does not practice good sleep hygiene. Denies urinary frequency, polydipsia, nocturia, polyphagia, no gross blood in urine.   Does note some early satiety. No nausea after meals. Denies anorexia.  Noting some breakthrough reflux symptoms. Not taking Nexium any more. Some difficulty swallowing food on rare occasion only. No issue with liquids.   Health Maintenance Mammogram: Just became due again. Is planning to schedule herself with her GYN  Past Medical History:  Diagnosis Date  . Allergic rhinitis due to other allergen   . Allergy    seasonal  . Anemia   . Arthritis   . Asthma    mild, intermittent  . Chicken pox   . Cholelithiasis   . CTS (carpal tunnel syndrome)   . Diabetes mellitus without complication ([facility])   . Fibroids   . Fibromyalgia   . Frequent urination   . GERD (gastroesophageal reflux disease)   . Headache(784.0)   . Migraine   . PONV (postoperative nausea and vomiting)    difficulty waking up  . Problems related to high-risk sexual behavior    Unprotected sex  . Symptomatic menopausal or female climacteric states     Current Outpatient Medications on File Prior to Visit  Medication Sig Dispense Refill  . Biotin 1000 MCG tablet Take 1,000 mcg by mouth daily.    . cetirizine (ZYRTEC) 10 MG tablet Take 1 tablet (10 mg total) by mouth daily. 90 tablet 1  . Cholecalciferol (VITAMIN D-3) 1000 units CAPS Take 1 capsule (1,000 Units total) by mouth daily. 30 capsule 0  . cyclobenzaprine (FLEXERIL) 10 MG tablet 1 tablet PO  QHS PRN 30 tablet 0  . esomeprazole (NEXIUM) 40 MG capsule TAKE 1 CAPSULE BY MOUTH EVERY MORNING BEFORE BREAKFAST 90 capsule 1  . levalbuterol (XOPENEX HFA) 45 MCG/ACT inhaler INHALE 1 TO 2 PUFFS BY MOUTH EVERY 6 HOURS AS NEEDED FOR WHEEZING 15 g 5  . Multiple Vitamins-Minerals (MULTIVITAMIN WITH MINERALS) tablet Take 1 tablet by mouth daily. Daily MVI w/Iron; Alternate w/Prenatal MVI    . NON FORMULARY Take 3 tablets by mouth as needed. [employer] Herbal Laxative Tab    . SUMAtriptan (IMITREX) 100 MG tablet Take 1 tablet (100 mg total) by mouth once for 1 dose. May repeat in 2 hours if headache persists or recurs. 10 tablet 0  . vitamin E 400 UNIT capsule Take 400 Units by mouth daily.    Marland Kitchen zonisamide (ZONEGRAN) 25 MG capsule Take 1 capsule by mouth daily.  5   No current facility-administered medications on file prior to visit.     Allergies  Allergen Reactions  . Iodine Anaphylaxis  . Shellfish Allergy Anaphylaxis  . Latex Rash    Family History  Problem Relation Age of Onset  . Diabetes Mother        Deceased  . Hypertension Mother   . Anemia Mother   . Hyperlipidemia Mother   . Arthritis/Rheumatoid Mother   . Migraines Mother   . CVA Mother   . Hypertension Father        Deceased  . Stroke Father   .  Diabetes Father        Legs Amputated  . Heart disease Father   . Arthritis Father   . Cancer Father   . Heart attack Maternal Aunt   . Diabetes Maternal Uncle   . Migraines Maternal Aunt   . Asthma Maternal Aunt   . Asthma Maternal Uncle   . Stomach cancer Sister   . Breast cancer Sister   . Uterine cancer Sister        or Ovarian  . Sleep apnea Brother   . COPD Brother   . Asthma Sister        x2  . Sudden death Brother        SIDS  . Hypertension Brother   . Diabetes Sister   . Asthma Daughter        x2  . Allergies Daughter   . Sickle cell trait Other        All Children  . Atrial fibrillation Daughter   . Anxiety disorder Daughter   . Allergies Son    . Hypertension Sister   . Heart disease Sister   . Asthma Brother     Social History   Socioeconomic History  . Marital status: Single    Spouse name: Not on file  . Number of children: Not on file  . Years of education: Not on file  . Highest education level: Not on file  Occupational History  . Not on file  Social Needs  . Financial resource strain: Not on file  . Food insecurity    Worry: Not on file    Inability: Not on file  . Transportation needs    Medical: Not on file    Non-medical: Not on file  Tobacco Use  . Smoking status: Never Smoker  . Smokeless tobacco: Never Used  Substance and Sexual Activity  . Alcohol use: Yes    Comment: rarely   . Drug use: No  . Sexual activity: Never    Birth control/protection: None, Abstinence, Surgical  Lifestyle  . Physical activity    Days per week: Not on file    Minutes per session: Not on file  . Stress: Not on file  Relationships  . Social Herbalist on phone: Not on file    Gets together: Not on file    Attends religious service: Not on file    Active member of club or organization: Not on file    Attends meetings of clubs or organizations: Not on file    Relationship status: Not on file  Other Topics Concern  . Not on file  Social History Narrative  . Not on file   Review of Systems - See HPI.  All other ROS are negative.  Wt 232 lb (105.2 kg)   BMI 36.34 kg/m   Physical Exam Vitals signs reviewed.  Constitutional:      Appearance: Normal appearance.  HENT:     Head: Normocephalic and atraumatic.     Right Ear: Tympanic membrane, ear canal and external ear normal.     Left Ear: Tympanic membrane, ear canal and external ear normal.     Nose: Nose normal. No mucosal edema.     Mouth/Throat:     Pharynx: Uvula midline. No oropharyngeal exudate or posterior oropharyngeal erythema.  Eyes:     Conjunctiva/sclera: Conjunctivae normal.     Pupils: Pupils are equal, round, and reactive to  light.  Neck:     Musculoskeletal: Neck supple.  Thyroid: No thyromegaly.  Cardiovascular:     Rate and Rhythm: Normal rate and regular rhythm.     Pulses: Normal pulses.     Heart sounds: Normal heart sounds.  Pulmonary:     Effort: Pulmonary effort is normal. No respiratory distress.     Breath sounds: Normal breath sounds. No wheezing or rales.  Abdominal:     General: Bowel sounds are normal. There is no distension.     Palpations: Abdomen is soft. There is no mass.     Tenderness: There is no abdominal tenderness. There is no guarding or rebound.  Lymphadenopathy:     Cervical: No cervical adenopathy.  Skin:    General: Skin is warm and dry.     Findings: No rash.  Neurological:     Mental Status: She is alert and oriented to person, place, and time.     Cranial Nerves: No cranial nerve deficit.    Assessment/Plan: 1. Pre-diabetes Reviewed dietary and exercise recommendations reviewed. She is working hard on this. Will recheck fasting labs today.  - Hemoglobin A1c - CBC with Differential/Platelet - Comprehensive metabolic panel - Lipid panel - TSH - Urine Microalbumin w/creat. ratio  2. Obesity (BMI 30-39.9) Dietary and exercise recommendations reviewed. Cholesterol overdue for recheck. Will check fating lipids along with her other labs.  - CBC with Differential/Platelet - Comprehensive metabolic panel - Lipid panel  3. Gastroesophageal reflux disease, unspecified whether esophagitis present No heartburn but concern for silent reflux due to occasional dysphagia and ongoing early satiety. No bowel changes, weight loss or constitutional symptoms.   4. Screening for blood or protein in urine + microscopic blood -- will send for microscopy to assess validity of this.  - POCT Urinalysis Dipstick - Urine Microscopic Only   Leeanne Rio, PA-C

## 2019-02-10 NOTE — Patient Instructions (Signed)
Please go to the lab today for blood work.  I will call you with your results. We will alter treatment regimen(s) if indicated by your results.   Please restart the Nexium once daily x 2 weeks Follow the diet below. Follow-up with me via video or phone visit for reassessment.  Keep up the hard work with diet and exercise!  Hang in there!   Gastroesophageal Reflux Disease, Adult Gastroesophageal reflux (GER) happens when acid from the stomach flows up into the tube that connects the mouth and the stomach (esophagus). Normally, food travels down the esophagus and stays in the stomach to be digested. With GER, food and stomach acid sometimes move back up into the esophagus. You may have a disease called gastroesophageal reflux disease (GERD) if the reflux:  Happens often.  Causes frequent or very bad symptoms.  Causes problems such as damage to the esophagus. When this happens, the esophagus becomes sore and swollen (inflamed). Over time, GERD can make small holes (ulcers) in the lining of the esophagus. What are the causes? This condition is caused by a problem with the muscle between the esophagus and the stomach. When this muscle is weak or not normal, it does not close properly to keep food and acid from coming back up from the stomach. The muscle can be weak because of:  Tobacco use.  Pregnancy.  Having a certain type of hernia (hiatal hernia).  Alcohol use.  Certain foods and drinks, such as coffee, chocolate, onions, and peppermint. What increases the risk? You are more likely to develop this condition if you:  Are overweight.  Have a disease that affects your connective tissue.  Use NSAID medicines. What are the signs or symptoms? Symptoms of this condition include:  Heartburn.  Difficult or painful swallowing.  The feeling of having a lump in the throat.  A bitter taste in the mouth.  Bad breath.  Having a lot of saliva.  Having an upset or bloated  stomach.  Belching.  Chest pain. Different conditions can cause chest pain. Make sure you see your doctor if you have chest pain.  Shortness of breath or noisy breathing (wheezing).  Ongoing (chronic) cough or a cough at night.  Wearing away of the surface of teeth (tooth enamel).  Weight loss. How is this treated? Treatment will depend on how bad your symptoms are. Your doctor may suggest:  Changes to your diet.  Medicine.  Surgery. Follow these instructions at home: Eating and drinking   Follow a diet as told by your doctor. You may need to avoid foods and drinks such as: ? Coffee and tea (with or without caffeine). ? Drinks that contain alcohol. ? Energy drinks and sports drinks. ? Bubbly (carbonated) drinks or sodas. ? Chocolate and cocoa. ? Peppermint and mint flavorings. ? Garlic and onions. ? Horseradish. ? Spicy and acidic foods. These include peppers, chili powder, curry powder, vinegar, hot sauces, and BBQ sauce. ? Citrus fruit juices and citrus fruits, such as oranges, lemons, and limes. ? Tomato-based foods. These include red sauce, chili, salsa, and pizza with red sauce. ? Fried and fatty foods. These include donuts, french fries, potato chips, and high-fat dressings. ? High-fat meats. These include hot dogs, rib eye steak, sausage, ham, and bacon. ? High-fat dairy items, such as whole milk, butter, and cream cheese.  Eat small meals often. Avoid eating large meals.  Avoid drinking large amounts of liquid with your meals.  Avoid eating meals during the 2-3 hours before  bedtime.  Avoid lying down right after you eat.  Do not exercise right after you eat. Lifestyle   Do not use any products that contain nicotine or tobacco. These include cigarettes, e-cigarettes, and chewing tobacco. If you need help quitting, ask your doctor.  Try to lower your stress. If you need help doing this, ask your doctor.  If you are overweight, lose an amount of weight  that is healthy for you. Ask your doctor about a safe weight loss goal. General instructions  Pay attention to any changes in your symptoms.  Take over-the-counter and prescription medicines only as told by your doctor. Do not take aspirin, ibuprofen, or other NSAIDs unless your doctor says it is okay.  Wear loose clothes. Do not wear anything tight around your waist.  Raise (elevate) the head of your bed about 6 inches (15 cm).  Avoid bending over if this makes your symptoms worse.  Keep all follow-up visits as told by your doctor. This is important. Contact a doctor if:  You have new symptoms.  You lose weight and you do not know why.  You have trouble swallowing or it hurts to swallow.  You have wheezing or a cough that keeps happening.  Your symptoms do not get better with treatment.  You have a hoarse voice. Get help right away if:  You have pain in your arms, neck, jaw, teeth, or back.  You feel sweaty, dizzy, or light-headed.  You have chest pain or shortness of breath.  You throw up (vomit) and your throw-up looks like blood or coffee grounds.  You pass out (faint).  Your poop (stool) is bloody or black.  You cannot swallow, drink, or eat. Summary  If a person has gastroesophageal reflux disease (GERD), food and stomach acid move back up into the esophagus and cause symptoms or problems such as damage to the esophagus.  Treatment will depend on how bad your symptoms are.  Follow a diet as told by your doctor.  Take all medicines only as told by your doctor. This information is not intended to replace advice given to you by your health care provider. Make sure you discuss any questions you have with your health care provider. Document Released: 09/16/2007 Document Revised: 10/06/2017 Document Reviewed: 10/06/2017 Elsevier Patient Education  2020 [employer].

## 2019-02-15 DIAGNOSIS — M5416 Radiculopathy, lumbar region: Secondary | ICD-10-CM | POA: Diagnosis not present

## 2019-02-15 DIAGNOSIS — M25562 Pain in left knee: Secondary | ICD-10-CM | POA: Diagnosis not present

## 2019-02-15 DIAGNOSIS — M6281 Muscle weakness (generalized): Secondary | ICD-10-CM | POA: Diagnosis not present

## 2019-02-22 ENCOUNTER — Encounter: Payer: Self-pay | Admitting: Physician Assistant

## 2019-02-24 ENCOUNTER — Ambulatory Visit (INDEPENDENT_AMBULATORY_CARE_PROVIDER_SITE_OTHER): Payer: Federal, State, Local not specified - PPO | Admitting: Physician Assistant

## 2019-02-24 ENCOUNTER — Other Ambulatory Visit: Payer: Self-pay

## 2019-02-24 ENCOUNTER — Encounter: Payer: Self-pay | Admitting: Physician Assistant

## 2019-02-24 DIAGNOSIS — E669 Obesity, unspecified: Secondary | ICD-10-CM | POA: Diagnosis not present

## 2019-02-24 DIAGNOSIS — L305 Pityriasis alba: Secondary | ICD-10-CM

## 2019-02-24 DIAGNOSIS — K5909 Other constipation: Secondary | ICD-10-CM

## 2019-02-24 DIAGNOSIS — K219 Gastro-esophageal reflux disease without esophagitis: Secondary | ICD-10-CM

## 2019-02-24 MED ORDER — TRIAMCINOLONE ACETONIDE 0.1 % EX CREA: 1.0000 "application " | TOPICAL_CREAM | Freq: Two times a day (BID) | CUTANEOUS | 0 refills | Status: DC

## 2019-02-24 NOTE — Progress Notes (Signed)
I have discussed the procedure for the virtual visit with the patient who has given consent to proceed with assessment and treatment.   Chea Malan S Annie Roseboom, CMA     

## 2019-02-24 NOTE — Progress Notes (Signed)
Virtual Visit via Video   I connected with patient on 02/24/19 at  8:00 AM EST by a video enabled telemedicine application and verified that I am speaking with the correct person using two identifiers.  Location patient: Home Location provider: [facility], Office Persons participating in the virtual visit: Patient, Provider, PA-Student Drucilla Schmidt), CMA (Eduard Clos)  I discussed the limitations of evaluation and management by telemedicine and the availability of in person appointments. The patient expressed understanding and agreed to proceed.  Subjective:   HPI:   Patient presents via Doxy.me today to follow-up regarding symptoms concerning for silent reflux. Patient with history of GERD no longer on medication at time of last visit. Patient noted taking the Nexium 3 days per week with significant improvement to symptoms. Still endorses early satiety, eating about half the amount she used to. Notes chronic constipation, taking 2 Swiss Kriss herbal laxative tablets with some relief. Having 3-4 bowel movements per day, increased from 1 bowel movement per day before using laxative. Denies dysphagia, nausea, vomiting, abdominal pain, chest discomfort, flatulence.    Diet -- Chicken and vegetables usually; smoothie for breakfast; some snacking but healthy options.   Exercise -- Walking throughout the day. 2 jobs still so regimen of exercise much harder.   Sleep -- doing very well.   ROS:   See pertinent positives and negatives per HPI.  Patient Active Problem List   Diagnosis Date Noted  . Obesity (BMI 30-39.9) 08/19/2018  . Pre-diabetes 02/18/2018  . OSA (obstructive sleep apnea) 05/28/2017  . Family history of breast cancer 12/25/2015  . Migraines 09/28/2013  . Visit for preventive health examination 09/10/2013  . GERD (gastroesophageal reflux disease) 03/19/2013  . Seasonal allergies 03/19/2013  . Neck pain, chronic 03/19/2013  . IBS (irritable bowel syndrome)  03/19/2013    Social History   Tobacco Use  . Smoking status: Never Smoker  . Smokeless tobacco: Never Used  Substance Use Topics  . Alcohol use: Yes    Comment: rarely     Current Outpatient Medications:  .  Biotin 1000 MCG tablet, Take 1,000 mcg by mouth daily., Disp: , Rfl:  .  cetirizine (ZYRTEC) 10 MG tablet, Take 1 tablet (10 mg total) by mouth daily., Disp: 90 tablet, Rfl: 1 .  Cholecalciferol (VITAMIN D-3) 1000 units CAPS, Take 1 capsule (1,000 Units total) by mouth daily., Disp: 30 capsule, Rfl: 0 .  cyclobenzaprine (FLEXERIL) 10 MG tablet, 1 tablet PO QHS PRN, Disp: 30 tablet, Rfl: 0 .  esomeprazole (NEXIUM) 40 MG capsule, TAKE 1 CAPSULE BY MOUTH EVERY MORNING BEFORE BREAKFAST, Disp: 90 capsule, Rfl: 1 .  levalbuterol (XOPENEX HFA) 45 MCG/ACT inhaler, INHALE 1 TO 2 PUFFS BY MOUTH EVERY 6 HOURS AS NEEDED FOR WHEEZING, Disp: 15 g, Rfl: 5 .  meloxicam (MOBIC) 15 MG tablet, TK 1 T PO QD FOR 2 WKS THEN PRF PAIN AFTER, Disp: , Rfl:  .  NON FORMULARY, Take 3 tablets by mouth as needed. Swiss Kriss Herbal Laxative Tab, Disp: , Rfl:  .  SUMAtriptan (IMITREX) 100 MG tablet, Take 1 tablet (100 mg total) by mouth once for 1 dose. May repeat in 2 hours if headache persists or recurs., Disp: 10 tablet, Rfl: 0 .  zonisamide (ZONEGRAN) 25 MG capsule, Take 1 capsule by mouth daily., Disp: , Rfl: 5  Allergies  Allergen Reactions  . Iodine Anaphylaxis  . Shellfish Allergy Anaphylaxis  . Latex Rash    Objective:   There were no vitals taken  for this visit.  Patient is well-developed, well-nourished in no acute distress.  Resting comfortably on couch at home.  Head is normocephalic, atraumatic.  No labored breathing.  Speech is clear and coherent with logical content.  Patient is alert and oriented at baseline.   Assessment and Plan:   1. Gastroesophageal reflux disease, unspecified whether esophagitis present 2. Chronic constipation Dysphagia improved with Nexium. Still with early  satiety and her ongoing chronic constipation, currently treated with Swiss Kriss as she could not afford Linzess or Amitiza. Want her to continue Nexium. Start probiotic. Referral placed backed to GI for further assessment, especially giving the early satiety. - Ambulatory referral to Gastroenterology  3. Obesity (BMI 30-39.9) Great diet.  Is active. Formal exercise regimen is quite the challenge due to working 2 full jobs per week. Sleep is good. Recent TSH within normal limits. Patient would like to see weight loss specialist. Referral placed.  - Amb Ref to Medical Weight Management  4. Pityriasis alba At end of the visit, patient mentions white spots on her left arm present for several weeks. Non painful, sometimes pruritic. Notes skin feels smooth to her. Unsure if there were other lesions/rash present prior to the development of these spots. On limited exam there are white rounded macular lesions with only somewhat defined borders in a scattered pattern of upper arm, seem most consistent with pityriasis alba. Will start moisturizer and low-potency topical steroid x 2 weeks. Follow-up in office for further assessment if not improving/resolving.      Leeanne Rio, PA-C 02/24/2019

## 2019-02-24 NOTE — Patient Instructions (Signed)
-  Please keep hydrated.  -Continue the good diet. -Keep active as much as possible.   -Continue the Nexium as directed. -Consider starting a daily probiotic. -You will be contacted for further assessment with Gastroenterology  -You will be contacted for assessment by Medical Weight Management as well  -Lastly, used a good unscented, non-dyed moisturizer to the skin.  -Use the Triamcinolone cream twice daily to the area for 1-2 weeks and let me know how the area responds.  Hang in there!

## 2019-02-25 ENCOUNTER — Inpatient Hospital Stay: Admission: RE | Admit: 2019-02-25 | Payer: Federal, State, Local not specified - PPO | Source: Ambulatory Visit

## 2019-03-16 DIAGNOSIS — K080 Exfoliation of teeth due to systemic causes: Secondary | ICD-10-CM | POA: Diagnosis not present

## 2019-03-28 ENCOUNTER — Encounter: Payer: Self-pay | Admitting: Physician Assistant

## 2019-03-28 ENCOUNTER — Other Ambulatory Visit: Payer: Self-pay | Admitting: Physician Assistant

## 2019-03-28 DIAGNOSIS — H539 Unspecified visual disturbance: Secondary | ICD-10-CM

## 2019-03-28 DIAGNOSIS — L305 Pityriasis alba: Secondary | ICD-10-CM

## 2019-03-28 DIAGNOSIS — L819 Disorder of pigmentation, unspecified: Secondary | ICD-10-CM

## 2019-03-28 MED ORDER — SUMATRIPTAN SUCCINATE 100 MG PO TABS: ORAL_TABLET | ORAL | 0 refills | Status: DC

## 2019-03-29 ENCOUNTER — Ambulatory Visit: Payer: Federal, State, Local not specified - PPO | Admitting: Gastroenterology

## 2019-03-29 ENCOUNTER — Encounter: Payer: Self-pay | Admitting: Gastroenterology

## 2019-03-29 ENCOUNTER — Other Ambulatory Visit: Payer: Self-pay

## 2019-03-29 VITALS — BP 120/80 | HR 76 | Temp 98.6°F | Ht 67.0 in | Wt 231.0 lb

## 2019-03-29 DIAGNOSIS — K5909 Other constipation: Secondary | ICD-10-CM | POA: Diagnosis not present

## 2019-03-29 MED ORDER — LINACLOTIDE 72 MCG PO CAPS: 72.0000 ug | ORAL_CAPSULE | Freq: Every day | ORAL | 11 refills | Status: DC

## 2019-03-29 NOTE — Patient Instructions (Signed)
If you are age 56 or older, your body mass index should be between 23-30. Your Body mass index is 36.18 kg/m. If this is out of the aforementioned range listed, please consider follow up with your Primary Care Provider.  If you are age 41 or younger, your body mass index should be between 19-25. Your Body mass index is 36.18 kg/m. If this is out of the aformentioned range listed, please consider follow up with your Primary Care Provider.   We have sent the following medications to your pharmacy for you to pick up at your convenience:  Linzess 72 mcg 1 tablet daily  Please start taking 1 dose of miralax daily.  Contact our office in 4 weeks to advise [place] of your progress.  Thank you for choosing me and [facility]

## 2019-03-29 NOTE — Progress Notes (Signed)
HPI: This is a very pleasant 58 year old woman who was referred to me by Brunetta Jeans, PA-C  to evaluate chronic constipation.    Chief complaint is chronic constipation  She has been constipated since she was very young.  She tells me she will have a bowel movement every couple days but this is only with a lot of pushing and straining unless she takes [employer].  She has been taking this for years.  Prior to that she tried Citrucel, she tried Metamucil, she tried Amitiza, she tried MiraLAX and none of these seem to help as well as Swiss Chris  Her weight is overall stable.  She does not have any overt GI bleeding  Colon cancer does not run in her family    Old Data Reviewed: Colonoscopy [facility] gastroenterology 2013.  I do not have any of those results at the time of this visit.  Blood work October 2020 shows hemoglobin 11.9 with an MCV is 77.9, complete metabolic profile normal and normal TSH   Review of systems: Pertinent positive and negative review of systems were noted in the above HPI section. All other review negative.   Past Medical History:  Diagnosis Date  . Allergic rhinitis due to other allergen   . Allergy    seasonal  . Anemia   . Arthritis   . Asthma    mild, intermittent  . Chicken pox   . Cholelithiasis   . CTS (carpal tunnel syndrome)   . Diabetes mellitus without complication ([place])   . Fibroids   . Fibromyalgia   . Frequent urination   . GERD (gastroesophageal reflux disease)   . Headache(784.0)   . Migraine   . PONV (postoperative nausea and vomiting)    difficulty waking up  . Problems related to high-risk sexual behavior    Unprotected sex  . Symptomatic menopausal or female climacteric states     Past Surgical History:  Procedure Laterality Date  . ABDOMINAL HYSTERECTOMY  2008   partial, ovaries remain  . CARPAL TUNNEL RELEASE Right 2015  . CHOLECYSTECTOMY  05/19/2011   Procedure: LAPAROSCOPIC CHOLECYSTECTOMY;  Surgeon: Zenovia Jarred, MD;  Location: [place];  Service: General;  Laterality: N/A;  . LASER ABLATION [place] / VULVAR    . PERCUTANEOUS PINNING  01/22/2012   Procedure: PERCUTANEOUS PINNING EXTREMITY;  Surgeon: Marybelle Killings, MD;  Location: [place];  Service: Orthopedics;  Laterality: Right;  Closed Reduction and Pinning right 5th Metacarpal Fracture  . TUBAL LIGATION    . WISDOM TOOTH EXTRACTION      Current Outpatient Medications  Medication Sig Dispense Refill  . Biotin 1000 MCG tablet Take 1,000 mcg by mouth daily.    . cetirizine (ZYRTEC) 10 MG tablet Take 1 tablet (10 mg total) by mouth daily. 90 tablet 1  . Cholecalciferol (VITAMIN D-3) 1000 units CAPS Take 1 capsule (1,000 Units total) by mouth daily. 30 capsule 0  . cyclobenzaprine (FLEXERIL) 10 MG tablet 1 tablet PO QHS PRN 30 tablet 0  . esomeprazole (NEXIUM) 40 MG capsule TAKE 1 CAPSULE BY MOUTH EVERY MORNING BEFORE BREAKFAST (Patient taking differently: TAKE 1 CAPSULE BY MOUTH EVERY MORNING BEFORE BREAKFAST ( she is only taking  about three times a week)) 90 capsule 1  . levalbuterol (XOPENEX HFA) 45 MCG/ACT inhaler INHALE 1 TO 2 PUFFS BY MOUTH EVERY 6 HOURS AS NEEDED FOR WHEEZING 15 g 5  . meloxicam (MOBIC) 15 MG tablet TK 1 T PO QD FOR 2  WKS THEN PRF PAIN AFTER    . NON FORMULARY Take 3 tablets by mouth as needed. Swiss Kriss Herbal Laxative Tab    . SUMAtriptan (IMITREX) 100 MG tablet Take one tablet by mouth onset of headache, May repeat in 2 hours if headache persists or recurs. 10 tablet 0  . triamcinolone cream (KENALOG) 0.1 % Apply 1 application topically 2 (two) times daily. For 10-14 days 30 g 0  . zonisamide (ZONEGRAN) 25 MG capsule Take 1 capsule by mouth daily.  5   No current facility-administered medications for this visit.    Allergies as of 03/29/2019 - Review Complete 03/29/2019  Allergen Reaction Noted  . Iodine Anaphylaxis 03/23/2011  . Shellfish allergy Anaphylaxis 03/23/2011  . Latex Rash 03/23/2011     Family History  Problem Relation Age of Onset  . Diabetes Mother        Deceased  . Hypertension Mother   . Anemia Mother   . Hyperlipidemia Mother   . Arthritis/Rheumatoid Mother   . Migraines Mother   . CVA Mother   . Hypertension Father        Deceased  . Stroke Father   . Diabetes Father        Legs Amputated  . Heart disease Father   . Arthritis Father   . Cancer Father   . Heart attack Maternal Aunt   . Diabetes Maternal Uncle   . Migraines Maternal Aunt   . Asthma Maternal Aunt   . Asthma Maternal Uncle   . Stomach cancer Sister   . Breast cancer Sister   . Uterine cancer Sister        or Ovarian  . Sleep apnea Brother   . COPD Brother   . Asthma Sister        x2  . Sudden death Brother        SIDS  . Hypertension Brother   . Diabetes Sister   . Asthma Daughter        x2  . Allergies Daughter   . Sickle cell trait Other        All Children  . Atrial fibrillation Daughter   . Anxiety disorder Daughter   . Allergies Son   . Hypertension Sister   . Heart disease Sister   . Asthma Brother     Social History   Socioeconomic History  . Marital status: Single    Spouse name: Not on file  . Number of children: Not on file  . Years of education: Not on file  . Highest education level: Not on file  Occupational History  . Not on file  Tobacco Use  . Smoking status: Never Smoker  . Smokeless tobacco: Never Used  Substance and Sexual Activity  . Alcohol use: Yes    Comment: rarely   . Drug use: No  . Sexual activity: Never    Birth control/protection: None, Abstinence, Surgical  Other Topics Concern  . Not on file  Social History Narrative  . Not on file   Social Determinants of Health   Financial Resource Strain:   . Difficulty of Paying Living Expenses: Not on file  Food Insecurity:   . Worried About Charity fundraiser in the Last Year: Not on file  . Ran Out of Food in the Last Year: Not on file  Transportation Needs:   . Lack of  Transportation (Medical): Not on file  . Lack of Transportation (Non-Medical): Not on file  Physical Activity:   .  Days of Exercise per Week: Not on file  . Minutes of Exercise per Session: Not on file  Stress:   . Feeling of Stress : Not on file  Social Connections:   . Frequency of Communication with Friends and Family: Not on file  . Frequency of Social Gatherings with Friends and Family: Not on file  . Attends Religious Services: Not on file  . Active Member of Clubs or Organizations: Not on file  . Attends Archivist Meetings: Not on file  . Marital Status: Not on file  Intimate Partner Violence:   . Fear of Current or Ex-Partner: Not on file  . Emotionally Abused: Not on file  . Physically Abused: Not on file  . Sexually Abused: Not on file     Physical Exam: BP 120/80 (BP Location: Left Arm, Patient Position: Sitting)   Pulse 76   Temp 98.6 F (37 C)   Ht 5\' 7"  (1.702 m)   Wt 231 lb (104.8 kg)   SpO2 98%   BMI 36.18 kg/m  Constitutional: generally well-appearing Psychiatric: alert and oriented x3 Eyes: extraocular movements intact Mouth: oral pharynx moist, no lesions Neck: supple no lymphadenopathy Cardiovascular: heart regular rate and rhythm Lungs: clear to auscultation bilaterally Abdomen: soft, nontender, nondistended, no obvious ascites, no peritoneal signs, normal bowel sounds Extremities: no lower extremity edema bilaterally Skin: no lesions on visible extremities   Assessment and plan: 58 y.o. female with chronic constipation  First we will get records from [facility] gastroenterology regarding her colonoscopy 2013.  Nothing has changed in her bowel habits recently and so I do not think that she requires invasive testing and instead I advised her to start taking MiraLAX 1 dose once daily as well as low-dose Linzess 72 mcg pill.  She will start taking 1 pill once daily and she will call to report on her response to this new bowel regimen in about a  month.   Please see the "Patient Instructions" section for addition details about the plan.   Owens Loffler, MD [facility] 03/29/2019, 10:12 AM  Cc: Brunetta Jeans, PA-C

## 2019-03-29 NOTE — Addendum Note (Signed)
Addended by: Brunetta Jeans on: 03/29/2019 11:35 AM   Modules accepted: Orders

## 2019-04-03 ENCOUNTER — Telehealth: Payer: Self-pay

## 2019-04-03 NOTE — Telephone Encounter (Signed)
Called [employer] and initiated PA for Linzess 72 mcg daily. Received approval over telephone and they will fax approval as well.

## 2019-04-04 DIAGNOSIS — L305 Pityriasis alba: Secondary | ICD-10-CM | POA: Diagnosis not present

## 2019-04-04 DIAGNOSIS — L853 Xerosis cutis: Secondary | ICD-10-CM | POA: Diagnosis not present

## 2019-04-11 DIAGNOSIS — R05 Cough: Secondary | ICD-10-CM | POA: Diagnosis not present

## 2019-04-11 DIAGNOSIS — Z20828 Contact with and (suspected) exposure to other viral communicable diseases: Secondary | ICD-10-CM | POA: Diagnosis not present

## 2019-04-11 DIAGNOSIS — R438 Other disturbances of smell and taste: Secondary | ICD-10-CM | POA: Diagnosis not present

## 2019-04-18 ENCOUNTER — Telehealth: Payer: Self-pay | Admitting: Gastroenterology

## 2019-04-18 DIAGNOSIS — H2513 Age-related nuclear cataract, bilateral: Secondary | ICD-10-CM | POA: Diagnosis not present

## 2019-04-18 DIAGNOSIS — H18413 Arcus senilis, bilateral: Secondary | ICD-10-CM | POA: Diagnosis not present

## 2019-04-18 DIAGNOSIS — H40033 Anatomical narrow angle, bilateral: Secondary | ICD-10-CM | POA: Diagnosis not present

## 2019-04-18 DIAGNOSIS — H524 Presbyopia: Secondary | ICD-10-CM | POA: Diagnosis not present

## 2019-04-18 DIAGNOSIS — H04123 Dry eye syndrome of bilateral lacrimal glands: Secondary | ICD-10-CM | POA: Diagnosis not present

## 2019-04-18 NOTE — Telephone Encounter (Signed)
Colonoscopy July 2013, Dr. Arta Silence.  Indication "generalized abdominal pain, constipation" findings normal examination.  No polyps or cancers.  She was recommended to have repeat colonoscopy at 10-year interval.  GI Office visit note May 2019, Dr. Arta Silence.  Discussed constipation and he put her on Amitiza 24 mcg tablets 1 pill twice daily.

## 2019-04-24 DIAGNOSIS — H40033 Anatomical narrow angle, bilateral: Secondary | ICD-10-CM | POA: Diagnosis not present

## 2019-04-24 DIAGNOSIS — H1045 Other chronic allergic conjunctivitis: Secondary | ICD-10-CM | POA: Diagnosis not present

## 2019-04-24 DIAGNOSIS — H04123 Dry eye syndrome of bilateral lacrimal glands: Secondary | ICD-10-CM | POA: Diagnosis not present

## 2019-04-24 DIAGNOSIS — H40031 Anatomical narrow angle, right eye: Secondary | ICD-10-CM | POA: Diagnosis not present

## 2019-05-02 DIAGNOSIS — R7303 Prediabetes: Secondary | ICD-10-CM | POA: Diagnosis not present

## 2019-05-02 DIAGNOSIS — Z8669 Personal history of other diseases of the nervous system and sense organs: Secondary | ICD-10-CM | POA: Diagnosis not present

## 2019-05-02 DIAGNOSIS — E611 Iron deficiency: Secondary | ICD-10-CM | POA: Insufficient documentation

## 2019-05-02 DIAGNOSIS — E669 Obesity, unspecified: Secondary | ICD-10-CM | POA: Diagnosis not present

## 2019-05-02 DIAGNOSIS — G4733 Obstructive sleep apnea (adult) (pediatric): Secondary | ICD-10-CM | POA: Diagnosis not present

## 2019-05-02 DIAGNOSIS — H9313 Tinnitus, bilateral: Secondary | ICD-10-CM | POA: Diagnosis not present

## 2019-05-02 DIAGNOSIS — M26623 Arthralgia of bilateral temporomandibular joint: Secondary | ICD-10-CM | POA: Diagnosis not present

## 2019-05-10 DIAGNOSIS — R7303 Prediabetes: Secondary | ICD-10-CM | POA: Diagnosis not present

## 2019-05-10 DIAGNOSIS — G4733 Obstructive sleep apnea (adult) (pediatric): Secondary | ICD-10-CM | POA: Diagnosis not present

## 2019-05-10 DIAGNOSIS — E669 Obesity, unspecified: Secondary | ICD-10-CM | POA: Diagnosis not present

## 2019-05-10 DIAGNOSIS — Z6837 Body mass index (BMI) 37.0-37.9, adult: Secondary | ICD-10-CM | POA: Diagnosis not present

## 2019-05-10 DIAGNOSIS — Z136 Encounter for screening for cardiovascular disorders: Secondary | ICD-10-CM | POA: Diagnosis not present

## 2019-05-12 DIAGNOSIS — K219 Gastro-esophageal reflux disease without esophagitis: Secondary | ICD-10-CM | POA: Diagnosis not present

## 2019-05-12 DIAGNOSIS — R1310 Dysphagia, unspecified: Secondary | ICD-10-CM | POA: Diagnosis not present

## 2019-05-15 DIAGNOSIS — H40032 Anatomical narrow angle, left eye: Secondary | ICD-10-CM | POA: Diagnosis not present

## 2019-05-16 DIAGNOSIS — L305 Pityriasis alba: Secondary | ICD-10-CM | POA: Diagnosis not present

## 2019-05-19 ENCOUNTER — Encounter: Payer: Federal, State, Local not specified - PPO | Admitting: Physician Assistant

## 2019-05-19 DIAGNOSIS — Z0289 Encounter for other administrative examinations: Secondary | ICD-10-CM

## 2019-05-22 ENCOUNTER — Encounter: Payer: Federal, State, Local not specified - PPO | Admitting: Physician Assistant

## 2019-05-22 ENCOUNTER — Encounter: Payer: Self-pay | Admitting: Physician Assistant

## 2019-05-23 ENCOUNTER — Encounter: Payer: Self-pay | Admitting: Physician Assistant

## 2019-05-23 ENCOUNTER — Ambulatory Visit (INDEPENDENT_AMBULATORY_CARE_PROVIDER_SITE_OTHER): Payer: Federal, State, Local not specified - PPO | Admitting: Physician Assistant

## 2019-05-23 ENCOUNTER — Other Ambulatory Visit: Payer: Self-pay

## 2019-05-23 VITALS — Ht 67.0 in | Wt 233.8 lb

## 2019-05-23 DIAGNOSIS — Z0000 Encounter for general adult medical examination without abnormal findings: Secondary | ICD-10-CM

## 2019-05-23 DIAGNOSIS — R7303 Prediabetes: Secondary | ICD-10-CM | POA: Diagnosis not present

## 2019-05-23 DIAGNOSIS — G4733 Obstructive sleep apnea (adult) (pediatric): Secondary | ICD-10-CM

## 2019-05-23 DIAGNOSIS — Z1231 Encounter for screening mammogram for malignant neoplasm of breast: Secondary | ICD-10-CM | POA: Diagnosis not present

## 2019-05-23 DIAGNOSIS — K219 Gastro-esophageal reflux disease without esophagitis: Secondary | ICD-10-CM

## 2019-05-23 NOTE — Progress Notes (Signed)
Virtual Visit via Video   I connected with patient on 05/23/19 at  8:30 AM EST by a video enabled telemedicine application and verified that I am speaking with the correct person using two identifiers.  Location patient: Home Location provider: [facility], Office Persons participating in the virtual visit: Patient, Provider, [place] (Patina Moore)  I discussed the limitations of evaluation and management by telemedicine and the availability of in person appointments. The patient expressed understanding and agreed to proceed.  Subjective:   HPI:   Patient presents today via Doxy.Me for CPE and to discuss upcoming bariatric surgery.   Patient denies acute concerns at today's visit.  Patient with history of prediabetes, morbid obesity and GERD.  Is currently on a regimen of Nexium once daily as directed.  Notes GERD symptoms controlled well with this medication.  If she goes a couple of days without taking she notes significant difference.  In terms of her morbid obesity and prediabetes, patient is working hard on diet and exercise.  Is currently followed by bariatric surgery and is undergoing the process to be approved for gastric bypass.  Has forms needed completed.  Patient has had one initial consult with the bariatric surgeon.  Is also seeing psychiatry and physical therapy her bariatric specialist.  Has previously tried different diets including keto, vegetarian, preplanned meals, weight watchers, etc.  Has been on trial of phentermine previously.  Patient is up-to-date on immunizations and colonoscopy.  Is due for mammogram.  Denies concerns.  States this typically is done by her GYN and that she is unable to get accomplished as she still owes them money.  ROS:   See pertinent positives and negatives per HPI.  Patient Active Problem List   Diagnosis Date Noted  . Obesity (BMI 30-39.9) 08/19/2018  . Pre-diabetes 02/18/2018  . OSA (obstructive sleep apnea) 05/28/2017  .  Family history of breast cancer 12/25/2015  . Migraines 09/28/2013  . Visit for preventive health examination 09/10/2013  . GERD (gastroesophageal reflux disease) 03/19/2013  . Seasonal allergies 03/19/2013  . Neck pain, chronic 03/19/2013  . IBS (irritable bowel syndrome) 03/19/2013    Social History   Tobacco Use  . Smoking status: Never Smoker  . Smokeless tobacco: Never Used  Substance Use Topics  . Alcohol use: Yes    Comment: rarely     Current Outpatient Medications:  .  Biotin 1000 MCG tablet, Take 1,000 mcg by mouth daily. Three times a week, Disp: , Rfl:  .  cetirizine (ZYRTEC) 10 MG tablet, Take 1 tablet (10 mg total) by mouth daily. (Patient taking differently: Take 10 mg by mouth daily. Three times a week), Disp: 90 tablet, Rfl: 1 .  Cholecalciferol (VITAMIN D-3) 1000 units CAPS, Take 1 capsule (1,000 Units total) by mouth daily., Disp: 30 capsule, Rfl: 0 .  cyclobenzaprine (FLEXERIL) 10 MG tablet, 1 tablet PO QHS PRN, Disp: 30 tablet, Rfl: 0 .  esomeprazole (NEXIUM) 40 MG capsule, TAKE 1 CAPSULE BY MOUTH EVERY MORNING BEFORE BREAKFAST (Patient taking differently: TAKE 1 CAPSULE BY MOUTH EVERY MORNING BEFORE BREAKFAST ( she is only taking  about three times a week)), Disp: 90 capsule, Rfl: 1 .  levalbuterol (XOPENEX HFA) 45 MCG/ACT inhaler, INHALE 1 TO 2 PUFFS BY MOUTH EVERY 6 HOURS AS NEEDED FOR WHEEZING, Disp: 15 g, Rfl: 5 .  linaclotide (LINZESS) 72 MCG capsule, Take 1 capsule (72 mcg total) by mouth daily before breakfast., Disp: 30 capsule, Rfl: 11 .  meloxicam (MOBIC) 15 MG  tablet, TK 1 T PO QD FOR 2 WKS THEN PRF PAIN AFTER, Disp: , Rfl:  .  NON FORMULARY, Take 3 tablets by mouth as needed. Swiss Kriss Herbal Laxative Tab, Disp: , Rfl:  .  SUMAtriptan (IMITREX) 100 MG tablet, Take one tablet by mouth onset of headache, May repeat in 2 hours if headache persists or recurs., Disp: 10 tablet, Rfl: 0 .  zonisamide (ZONEGRAN) 25 MG capsule, Take 1 capsule by mouth daily.,  Disp: , Rfl: 5  Allergies  Allergen Reactions  . Iodine Anaphylaxis  . Shellfish Allergy Anaphylaxis  . Latex Rash    Objective:   There were no vitals taken for this visit.  Patient is well-developed, well-nourished in no acute distress.  Resting comfortably at home.  Head is normocephalic, atraumatic.  No labored breathing.  Speech is clear and coherent with logical content.  Patient is alert and oriented at baseline.   Assessment and Plan:   1. Visit for preventive health examination Depression screen negative. Health Maintenance reviewed. Preventive schedule discussed and handout given in AVS. Appointment scheduled for Friday for fasting labs.  Orders placed in system.  2. Breast cancer screening by mammogram Order for screening mammogram placed. - MM DIGITAL SCREENING BILATERAL; Future  3. Morbid obesity due to excess calories ([place]) Continue management per bariatric specialist.  Will update fasting labs today to include lipid panel, CMP and A1C.  Letter of support will be completed and sent to her bariatric specialist.  4. Pre-diabetes Lab Results  Component Value Date   HGBA1C 6.4 02/10/2019  Is working hard on diet and exercise.  Repeat A1c.  5. Gastroesophageal reflux disease, unspecified whether esophagitis present Doing well on current regimen.  Will continue same.  Hopefully when she gets her surgery and weight loss is achieved these medications can be stopped.  6. OSA (obstructive sleep apnea) Using CPAP as directed.    Leeanne Rio, [place] 05/23/2019

## 2019-05-23 NOTE — Progress Notes (Signed)
I have discussed the procedure for the virtual visit with the patient who has given consent to proceed with assessment and treatment.   Nusaiba Guallpa S Denis Koppel, CMA     

## 2019-05-25 ENCOUNTER — Other Ambulatory Visit: Payer: Self-pay

## 2019-05-25 ENCOUNTER — Ambulatory Visit: Payer: Federal, State, Local not specified - PPO | Admitting: Podiatry

## 2019-05-25 ENCOUNTER — Encounter: Payer: Self-pay | Admitting: Podiatry

## 2019-05-25 DIAGNOSIS — L600 Ingrowing nail: Secondary | ICD-10-CM

## 2019-05-25 DIAGNOSIS — M79676 Pain in unspecified toe(s): Secondary | ICD-10-CM

## 2019-05-26 ENCOUNTER — Ambulatory Visit (INDEPENDENT_AMBULATORY_CARE_PROVIDER_SITE_OTHER): Payer: Federal, State, Local not specified - PPO | Admitting: *Deleted

## 2019-05-26 DIAGNOSIS — R7303 Prediabetes: Secondary | ICD-10-CM | POA: Diagnosis not present

## 2019-05-26 DIAGNOSIS — E669 Obesity, unspecified: Secondary | ICD-10-CM | POA: Diagnosis not present

## 2019-05-26 DIAGNOSIS — Z7189 Other specified counseling: Secondary | ICD-10-CM | POA: Diagnosis not present

## 2019-05-26 LAB — COMPREHENSIVE METABOLIC PANEL
ALT: 13 U/L (ref 0–35)
AST: 12 U/L (ref 0–37)
Albumin: 4 g/dL (ref 3.5–5.2)
Alkaline Phosphatase: 60 U/L (ref 39–117)
BUN: 6 mg/dL (ref 6–23)
CO2: 30 mEq/L (ref 19–32)
Calcium: 9.4 mg/dL (ref 8.4–10.5)
Chloride: 105 mEq/L (ref 96–112)
Creatinine, Ser: 0.71 mg/dL (ref 0.40–1.20)
GFR: 102.69 mL/min (ref >60.00)
Glucose, Bld: 108 mg/dL — ABNORMAL HIGH (ref 70–99)
Potassium: 4.3 mEq/L (ref 3.5–5.1)
Sodium: 139 mEq/L (ref 135–145)
Total Bilirubin: 0.3 mg/dL (ref 0.2–1.2)
Total Protein: 7.3 g/dL (ref 6.0–8.3)

## 2019-05-26 LAB — LIPID PANEL
Cholesterol: 132 mg/dL (ref 0–200)
HDL: 57.2 mg/dL (ref >39.00)
LDL Cholesterol: 60 mg/dL (ref 0–99)
NonHDL: 74.96
Total CHOL/HDL Ratio: 2
Triglycerides: 77 mg/dL (ref 0.0–149.0)
VLDL: 15.4 mg/dL (ref 0.0–40.0)

## 2019-05-26 LAB — HEMOGLOBIN A1C: Hgb A1c MFr Bld: 6.7 % — ABNORMAL HIGH (ref 4.6–6.5)

## 2019-06-01 DIAGNOSIS — H40033 Anatomical narrow angle, bilateral: Secondary | ICD-10-CM | POA: Diagnosis not present

## 2019-06-01 DIAGNOSIS — B009 Herpesviral infection, unspecified: Secondary | ICD-10-CM | POA: Insufficient documentation

## 2019-06-05 DIAGNOSIS — K2980 Duodenitis without bleeding: Secondary | ICD-10-CM | POA: Diagnosis not present

## 2019-06-05 DIAGNOSIS — K219 Gastro-esophageal reflux disease without esophagitis: Secondary | ICD-10-CM | POA: Diagnosis not present

## 2019-06-05 DIAGNOSIS — K269 Duodenal ulcer, unspecified as acute or chronic, without hemorrhage or perforation: Secondary | ICD-10-CM | POA: Diagnosis not present

## 2019-06-05 DIAGNOSIS — R1310 Dysphagia, unspecified: Secondary | ICD-10-CM | POA: Diagnosis not present

## 2019-06-05 DIAGNOSIS — K3189 Other diseases of stomach and duodenum: Secondary | ICD-10-CM | POA: Diagnosis not present

## 2019-06-06 ENCOUNTER — Encounter: Payer: Self-pay | Admitting: Physician Assistant

## 2019-06-07 DIAGNOSIS — R29898 Other symptoms and signs involving the musculoskeletal system: Secondary | ICD-10-CM | POA: Diagnosis not present

## 2019-06-07 DIAGNOSIS — K219 Gastro-esophageal reflux disease without esophagitis: Secondary | ICD-10-CM | POA: Diagnosis not present

## 2019-06-07 DIAGNOSIS — Z723 Lack of physical exercise: Secondary | ICD-10-CM | POA: Diagnosis not present

## 2019-06-09 DIAGNOSIS — E669 Obesity, unspecified: Secondary | ICD-10-CM | POA: Diagnosis not present

## 2019-06-09 DIAGNOSIS — Z6837 Body mass index (BMI) 37.0-37.9, adult: Secondary | ICD-10-CM | POA: Diagnosis not present

## 2019-06-20 ENCOUNTER — Other Ambulatory Visit: Payer: Self-pay | Admitting: Physician Assistant

## 2019-06-20 DIAGNOSIS — E559 Vitamin D deficiency, unspecified: Secondary | ICD-10-CM

## 2019-06-20 MED ORDER — VITAMIN D-3 25 MCG (1000 UT) PO CAPS: 1.0000 | ORAL_CAPSULE | Freq: Every day | ORAL | 5 refills | Status: DC

## 2019-06-20 MED ORDER — SUMATRIPTAN SUCCINATE 100 MG PO TABS: ORAL_TABLET | ORAL | 0 refills | Status: AC

## 2019-06-22 ENCOUNTER — Ambulatory Visit: Payer: Federal, State, Local not specified - PPO

## 2019-06-22 ENCOUNTER — Encounter: Payer: Self-pay | Admitting: Physician Assistant

## 2019-06-22 NOTE — Telephone Encounter (Signed)
Patient would need appointment to discuss further so we can determine what all is going on and decide next best steps.

## 2019-06-29 DIAGNOSIS — Z713 Dietary counseling and surveillance: Secondary | ICD-10-CM | POA: Diagnosis not present

## 2019-06-29 DIAGNOSIS — E669 Obesity, unspecified: Secondary | ICD-10-CM | POA: Diagnosis not present

## 2019-06-29 DIAGNOSIS — G4733 Obstructive sleep apnea (adult) (pediatric): Secondary | ICD-10-CM | POA: Diagnosis not present

## 2019-06-29 DIAGNOSIS — K219 Gastro-esophageal reflux disease without esophagitis: Secondary | ICD-10-CM | POA: Diagnosis not present

## 2019-07-03 ENCOUNTER — Encounter: Payer: Self-pay | Admitting: Orthopedic Surgery

## 2019-07-03 ENCOUNTER — Other Ambulatory Visit: Payer: Self-pay

## 2019-07-03 ENCOUNTER — Ambulatory Visit: Payer: Self-pay

## 2019-07-03 ENCOUNTER — Ambulatory Visit: Payer: Federal, State, Local not specified - PPO | Admitting: Orthopedic Surgery

## 2019-07-03 VITALS — Ht 67.0 in | Wt 233.0 lb

## 2019-07-03 DIAGNOSIS — L97921 Non-pressure chronic ulcer of unspecified part of left lower leg limited to breakdown of skin: Secondary | ICD-10-CM | POA: Diagnosis not present

## 2019-07-03 DIAGNOSIS — G8929 Other chronic pain: Secondary | ICD-10-CM

## 2019-07-03 DIAGNOSIS — M25562 Pain in left knee: Secondary | ICD-10-CM | POA: Diagnosis not present

## 2019-07-03 MED ORDER — METHYLPREDNISOLONE ACETATE 40 MG/ML IJ SUSP
40.0000 mg | INTRAMUSCULAR | Status: AC | PRN
  Administered 2019-07-03: 11:00:00 40 mg via INTRA_ARTICULAR

## 2019-07-03 MED ORDER — LIDOCAINE HCL (PF) 1 % IJ SOLN
5.0000 mL | INTRAMUSCULAR | Status: AC | PRN
  Administered 2019-07-03: 11:00:00 5 mL

## 2019-07-03 NOTE — Progress Notes (Signed)
Office Visit Note   Patient: Natalie Vazquez           Date of Birth: [birth year]           MRN: [id number] Visit Date: 07/03/2019              Requested by: Brunetta Jeans, PA-C [id number] [place],  [place] [id number] PCP: Brunetta Jeans, PA-C  Chief Complaint  Patient presents with  . Left Knee - Pain      HPI: Patient is a 59 year old woman who states she has been having left knee pain for about 3 to 4 years over the lateral joint line states she has some swelling decreased range of motion and tightness she states that her knee will give way but she is denied any falls.  She has used a copper fit brace to [place] and Tylenol without relief.  Assessment & Plan: Visit Diagnoses:  1. Chronic pain of left knee     Plan: Left knee was injected she tolerated this well recommended strengthening exercises.  Follow-Up Instructions: Return if symptoms worsen or fail to improve.   Ortho Exam  Patient is alert, oriented, no adenopathy, well-dressed, normal affect, normal respiratory effort. Examination patient has full active range of motion of the left knee collaterals and cruciates are stable she is tender to palpation over the medial joint line there is crepitation with range of motion of the patella.  There is no effusion.  Imaging: XR Knee 1-2 Views Left  Result Date: 07/03/2019 2 view radiographs of the left knee shows mild joint space narrowing no bony spurs no fractures.  No images are attached to the encounter.  Labs: Lab Results  Component Value Date   HGBA1C 6.7 (H) 05/26/2019   HGBA1C 6.4 02/10/2019   HGBA1C 6.1 (A) 02/18/2018   ESRSEDRATE 1 09/07/2013   LABORGA CORYNEBACTERIUM SPECIES 01/09/2016     Lab Results  Component Value Date   ALBUMIN 4.0 05/26/2019   ALBUMIN 4.1 02/10/2019   ALBUMIN 3.7 11/03/2017    No results found for: MG Lab Results  Component Value Date   VD25OH 19.84 (L) 05/28/2017    No results found for: PREALBUMIN CBC  EXTENDED Latest Ref Rng & Units 02/10/2019 11/03/2017 05/28/2017  WBC 4.0 - 10.5 K/uL 5.2 5.9 5.4  RBC 3.87 - 5.11 Mil/uL 4.74 5.08 5.10  HGB 12.0 - 15.0 g/dL 11.9(L) 12.7 12.7  HCT 36.0 - 46.0 % 36.9 38.7 39.4  PLT 150.0 - 400.0 K/uL 229.0 267 257.0  NEUTROABS 1.4 - 7.7 K/uL 2.3 - 2.3  LYMPHSABS 0.7 - 4.0 K/uL 2.2 - 2.6     Body mass index is 36.49 kg/m.  Orders:  Orders Placed This Encounter  Procedures  . XR Knee 1-2 Views Left   No orders of the defined types were placed in this encounter.    Procedures: Large Joint Inj: L knee on 07/03/2019 11:07 AM Indications: pain and diagnostic evaluation Details: 22 G 1.5 in needle, anteromedial approach  Arthrogram: No  Medications: 5 mL lidocaine (PF) 1 %; 40 mg methylPREDNISolone acetate 40 MG/ML Outcome: tolerated well, no immediate complications Procedure, treatment alternatives, risks and benefits explained, specific risks discussed. Consent was given by the patient. Immediately prior to procedure a time out was called to verify the correct patient, procedure, equipment, support staff and site/side marked as required. Patient was prepped and draped in the usual sterile fashion.      Clinical Data: No additional  findings.  ROS:  All other systems negative, except as noted in the HPI. Review of Systems  Objective: Vital Signs: Ht 5\' 7"  (1.702 m)   Wt 233 lb (105.7 kg)   BMI 36.49 kg/m   Specialty Comments:  No specialty comments available.  PMFS History: Patient Active Problem List   Diagnosis Date Noted  . Morbid obesity due to excess calories ([place]) 05/02/2019  . Iron deficiency 05/02/2019  . Obesity (BMI 30-39.9) 08/19/2018  . Pre-diabetes 02/18/2018  . OSA (obstructive sleep apnea) 05/28/2017  . Family history of breast cancer 12/25/2015  . Migraines 09/28/2013  . Visit for preventive health examination 09/10/2013  . GERD (gastroesophageal reflux disease) 03/19/2013  . Seasonal allergies 03/19/2013  .  Neck pain, chronic 03/19/2013  . IBS (irritable bowel syndrome) 03/19/2013   Past Medical History:  Diagnosis Date  . Allergic rhinitis due to other allergen   . Allergy    seasonal  . Anemia   . Arthritis   . Asthma    mild, intermittent  . Chicken pox   . Cholelithiasis   . CTS (carpal tunnel syndrome)   . Diabetes mellitus without complication ([place])   . Fibroids   . Fibromyalgia   . Frequent urination   . GERD (gastroesophageal reflux disease)   . Headache(784.0)   . Migraine   . PONV (postoperative nausea and vomiting)    difficulty waking up  . Problems related to high-risk sexual behavior    Unprotected sex  . Symptomatic menopausal or female climacteric states     Family History  Problem Relation Age of Onset  . Diabetes Mother        Deceased  . Hypertension Mother   . Anemia Mother   . Hyperlipidemia Mother   . Arthritis/Rheumatoid Mother   . Migraines Mother   . CVA Mother   . Hypertension Father        Deceased  . Stroke Father   . Diabetes Father        Legs Amputated  . Heart disease Father   . Arthritis Father   . Cancer Father   . Heart attack Maternal Aunt   . Diabetes Maternal Uncle   . Migraines Maternal Aunt   . Asthma Maternal Aunt   . Asthma Maternal Uncle   . Stomach cancer Sister   . Breast cancer Sister   . Uterine cancer Sister        or Ovarian  . Sleep apnea Brother   . COPD Brother   . Asthma Sister        x2  . Sudden death Brother        SIDS  . Hypertension Brother   . Diabetes Sister   . Asthma Daughter        x2  . Allergies Daughter   . Sickle cell trait Other        All Children  . Atrial fibrillation Daughter   . Anxiety disorder Daughter   . Allergies Son   . Hypertension Sister   . Heart disease Sister   . Asthma Brother     Past Surgical History:  Procedure Laterality Date  . ABDOMINAL HYSTERECTOMY  2008   partial, ovaries remain  . CARPAL TUNNEL RELEASE Right 2015  . CHOLECYSTECTOMY  05/19/2011    Procedure: LAPAROSCOPIC CHOLECYSTECTOMY;  Surgeon: Zenovia Jarred, MD;  Location: [facility];  Service: General;  Laterality: N/A;  . LASER ABLATION [place] / VULVAR    . PERCUTANEOUS PINNING  01/22/2012   Procedure: PERCUTANEOUS PINNING EXTREMITY;  Surgeon: Marybelle Killings, MD;  Location: [facility];  Service: Orthopedics;  Laterality: Right;  Closed Reduction and Pinning right 5th Metacarpal Fracture  . TUBAL LIGATION    . WISDOM TOOTH EXTRACTION     Social History   Occupational History  . Not on file  Tobacco Use  . Smoking status: Never Smoker  . Smokeless tobacco: Never Used  Substance and Sexual Activity  . Alcohol use: Yes    Comment: rarely   . Drug use: No  . Sexual activity: Never    Birth control/protection: None, Abstinence, Surgical

## 2019-07-04 ENCOUNTER — Other Ambulatory Visit: Payer: Self-pay | Admitting: Physician Assistant

## 2019-07-04 ENCOUNTER — Ambulatory Visit
Admission: RE | Admit: 2019-07-04 | Discharge: 2019-07-04 | Disposition: A | Discharge status: Home / Self Care | Payer: Federal, State, Local not specified - PPO | Source: Ambulatory Visit | Attending: Physician Assistant | Admitting: Physician Assistant

## 2019-07-04 DIAGNOSIS — Z1231 Encounter for screening mammogram for malignant neoplasm of breast: Secondary | ICD-10-CM

## 2019-07-05 ENCOUNTER — Encounter: Payer: Self-pay | Admitting: Physician Assistant

## 2019-07-11 DIAGNOSIS — K219 Gastro-esophageal reflux disease without esophagitis: Secondary | ICD-10-CM | POA: Diagnosis not present

## 2019-07-11 DIAGNOSIS — R1310 Dysphagia, unspecified: Secondary | ICD-10-CM | POA: Diagnosis not present

## 2019-07-12 ENCOUNTER — Encounter: Payer: Self-pay | Admitting: Physician Assistant

## 2019-07-17 DIAGNOSIS — F54 Psychological and behavioral factors associated with disorders or diseases classified elsewhere: Secondary | ICD-10-CM | POA: Diagnosis not present

## 2019-07-17 DIAGNOSIS — E669 Obesity, unspecified: Secondary | ICD-10-CM | POA: Diagnosis not present

## 2019-07-31 DIAGNOSIS — H1045 Other chronic allergic conjunctivitis: Secondary | ICD-10-CM | POA: Diagnosis not present

## 2019-07-31 DIAGNOSIS — H40033 Anatomical narrow angle, bilateral: Secondary | ICD-10-CM | POA: Diagnosis not present

## 2019-07-31 DIAGNOSIS — H04123 Dry eye syndrome of bilateral lacrimal glands: Secondary | ICD-10-CM | POA: Diagnosis not present

## 2019-08-08 DIAGNOSIS — Z6837 Body mass index (BMI) 37.0-37.9, adult: Secondary | ICD-10-CM | POA: Diagnosis not present

## 2019-08-08 DIAGNOSIS — E559 Vitamin D deficiency, unspecified: Secondary | ICD-10-CM | POA: Diagnosis not present

## 2019-08-08 DIAGNOSIS — E669 Obesity, unspecified: Secondary | ICD-10-CM | POA: Diagnosis not present

## 2019-08-08 DIAGNOSIS — R7303 Prediabetes: Secondary | ICD-10-CM | POA: Diagnosis not present

## 2019-08-11 ENCOUNTER — Telehealth: Payer: Self-pay | Admitting: Physician Assistant

## 2019-08-11 DIAGNOSIS — G43019 Migraine without aura, intractable, without status migrainosus: Secondary | ICD-10-CM | POA: Diagnosis not present

## 2019-08-11 DIAGNOSIS — R519 Headache, unspecified: Secondary | ICD-10-CM | POA: Diagnosis not present

## 2019-08-11 NOTE — Telephone Encounter (Signed)
I have left pt vm to call me back in regard to a billing question.

## 2019-09-19 NOTE — Telephone Encounter (Signed)
Patient has called back in regard.  I have given patient billings phone number to discuss possible payment arrangements or financial assistance.

## 2019-10-02 DIAGNOSIS — K0825 Moderate atrophy of the maxilla: Secondary | ICD-10-CM | POA: Diagnosis not present

## 2019-10-02 DIAGNOSIS — K0821 Minimal atrophy of the mandible: Secondary | ICD-10-CM | POA: Diagnosis not present

## 2019-10-15 NOTE — Progress Notes (Signed)
  Subjective:  Patient ID: Natalie Vazquez, female    DOB: 05/09/1960,  MRN: [id number]  Chief Complaint  Patient presents with  . Nail Problem    Left 1st digit medial border "Feels like the nail is digging in" 2-3 week duration, denies drainage or any known injuries.    59 y.o. female presents with the above complaint. History confirmed with patient.   Objective:  Physical Exam: warm, good capillary refill, no trophic changes or ulcerative lesions, normal DP and PT pulses and normal sensory exam.  Painful ingrowing nail at  medial border of the left, hallux; without warmth, erythema or drainage  Assessment:   1. Ingrown nail   2. Pain around toenail      Plan:  Patient was evaluated and treated and all questions answered.  Ingrown Nail, left -Mild ingrowing nail gently debrided in slant back fashion to patient relief. Follow-up should the nail worsen for possible further procedure  No follow-ups on file.   MDM

## 2019-11-13 DIAGNOSIS — L305 Pityriasis alba: Secondary | ICD-10-CM | POA: Diagnosis not present

## 2019-11-27 NOTE — Progress Notes (Deleted)
New Patient Note  RE: Natalie Vazquez MRN: [id number] DOB: December 26, 1960 Date of Office Visit: 11/28/2019  Referring provider: Delorse Limber Primary care provider: Brunetta Jeans, PA-C  Chief Complaint: No chief complaint on file.  History of Present Illness: I had the pleasure of seeing Natalie Vazquez for initial evaluation at [facility] on 11/27/2019. She is a 59 y.o. female, who is referred here by Brunetta Jeans, PA-C for the evaluation of ***.  ***  Assessment and Plan: Natalie Vazquez is a 59 y.o. female with: No problem-specific Assessment & Plan notes found for this encounter.  No follow-ups on file.  No orders of the defined types were placed in this encounter.  Lab Orders  No laboratory test(s) ordered today    Other allergy screening: Asthma: {Blank single:19197::"yes","no"} Rhino conjunctivitis: {Blank single:19197::"yes","no"} Food allergy: {Blank single:19197::"yes","no"} Medication allergy: {Blank single:19197::"yes","no"} Hymenoptera allergy: {Blank single:19197::"yes","no"} Urticaria: {Blank single:19197::"yes","no"} Eczema:{Blank single:19197::"yes","no"} History of recurrent infections suggestive of immunodeficency: {Blank single:19197::"yes","no"}  Diagnostics: Spirometry:  Tracings reviewed. Her effort: {Blank single:19197::"Good reproducible efforts.","It was hard to get consistent efforts and there is a question as to whether this reflects a maximal maneuver.","Poor effort, data can not be interpreted."} FVC: ***L FEV1: ***L, ***% predicted FEV1/FVC ratio: ***% Interpretation: {Blank single:19197::"Spirometry consistent with mild obstructive disease","Spirometry consistent with moderate obstructive disease","Spirometry consistent with severe obstructive disease","Spirometry consistent with possible restrictive disease","Spirometry consistent with mixed obstructive and restrictive disease","Spirometry uninterpretable due to  technique","Spirometry consistent with normal pattern","No overt abnormalities noted given today's efforts"}.  Please see scanned spirometry results for details.  Skin Testing: {Blank single:19197::"Select foods","Environmental allergy panel","Environmental allergy panel and select foods","Food allergy panel","None","Deferred due to recent antihistamines use"}. Positive test to: ***. Negative test to: ***.  Results discussed with patient/family.   Past Medical History: Patient Active Problem List   Diagnosis Date Noted  . Morbid obesity due to excess calories ([place]) 05/02/2019  . Iron deficiency 05/02/2019  . Obesity (BMI 30-39.9) 08/19/2018  . Pre-diabetes 02/18/2018  . OSA (obstructive sleep apnea) 05/28/2017  . Family history of breast cancer 12/25/2015  . Migraines 09/28/2013  . Visit for preventive health examination 09/10/2013  . GERD (gastroesophageal reflux disease) 03/19/2013  . Seasonal allergies 03/19/2013  . Neck pain, chronic 03/19/2013  . IBS (irritable bowel syndrome) 03/19/2013   Past Medical History:  Diagnosis Date  . Allergic rhinitis due to other allergen   . Allergy    seasonal  . Anemia   . Arthritis   . Asthma    mild, intermittent  . Chicken pox   . Cholelithiasis   . CTS (carpal tunnel syndrome)   . Diabetes mellitus without complication ([place])   . Fibroids   . Fibromyalgia   . Frequent urination   . GERD (gastroesophageal reflux disease)   . Headache(784.0)   . Migraine   . PONV (postoperative nausea and vomiting)    difficulty waking up  . Problems related to high-risk sexual behavior    Unprotected sex  . Symptomatic menopausal or female climacteric states    Past Surgical History: Past Surgical History:  Procedure Laterality Date  . ABDOMINAL HYSTERECTOMY  2008   partial, ovaries remain  . CARPAL TUNNEL RELEASE Right 2015  . CHOLECYSTECTOMY  05/19/2011   Procedure: LAPAROSCOPIC CHOLECYSTECTOMY;  Surgeon: Zenovia Jarred, MD;  Location:  [facility];  Service: General;  Laterality: N/A;  . LASER ABLATION [place] / VULVAR    . PERCUTANEOUS PINNING  01/22/2012   Procedure:  PERCUTANEOUS PINNING EXTREMITY;  Surgeon: Marybelle Killings, MD;  Location: [facility];  Service: Orthopedics;  Laterality: Right;  Closed Reduction and Pinning right 5th Metacarpal Fracture  . TUBAL LIGATION    . WISDOM TOOTH EXTRACTION     Medication List:  Current Outpatient Medications  Medication Sig Dispense Refill  . Bee Pollen 1000 MG TABS Take 1 tablet by mouth.    . Biotin 1000 MCG tablet Take 1,000 mcg by mouth daily. Three times a week    . cetirizine (ZYRTEC) 10 MG tablet Take 1 tablet (10 mg total) by mouth daily. (Patient taking differently: Take 10 mg by mouth daily. Three times a week) 90 tablet 1  . Cholecalciferol (VITAMIN D-3) 25 MCG (1000 UT) CAPS Take 1 capsule (1,000 Units total) by mouth daily. 30 capsule 5  . cyclobenzaprine (FLEXERIL) 10 MG tablet 1 tablet PO QHS PRN 30 tablet 0  . esomeprazole (NEXIUM) 40 MG capsule TAKE 1 CAPSULE BY MOUTH EVERY MORNING BEFORE BREAKFAST (Patient taking differently: TAKE 1 CAPSULE BY MOUTH EVERY MORNING BEFORE BREAKFAST ( she is only taking  about three times a week)) 90 capsule 1  . levalbuterol (XOPENEX HFA) 45 MCG/ACT inhaler INHALE 1 TO 2 PUFFS BY MOUTH EVERY 6 HOURS AS NEEDED FOR WHEEZING 15 g 5  . NON FORMULARY Take 3 tablets by mouth as needed. Swiss Kriss Herbal Laxative Tab    . SUMAtriptan (IMITREX) 100 MG tablet Take one tablet by mouth onset of headache, May repeat in 2 hours if headache persists or recurs. 10 tablet 0  . Turmeric 500 MG CAPS Take 1 tablet by mouth daily.    . vitamin E 180 MG (400 UNITS) capsule Take 1 capsule by mouth daily.    Marland Kitchen zonisamide (ZONEGRAN) 25 MG capsule Take 1 capsule by mouth daily.  5   No current facility-administered medications for this visit.   Allergies: Allergies  Allergen Reactions  . Iodine Anaphylaxis  . Shellfish Allergy Anaphylaxis  . Latex  Rash   Social History: Social History   Socioeconomic History  . Marital status: Single    Spouse name: Not on file  . Number of children: Not on file  . Years of education: Not on file  . Highest education level: Not on file  Occupational History  . Not on file  Tobacco Use  . Smoking status: Never Smoker  . Smokeless tobacco: Never Used  Vaping Use  . Vaping Use: Never used  Substance and Sexual Activity  . Alcohol use: Yes    Comment: rarely   . Drug use: No  . Sexual activity: Never    Birth control/protection: None, Abstinence, Surgical  Other Topics Concern  . Not on file  Social History Narrative  . Not on file   Social Determinants of Health   Financial Resource Strain:   . Difficulty of Paying Living Expenses:   Food Insecurity:   . Worried About Charity fundraiser in the Last Year:   . Arboriculturist in the Last Year:   Transportation Needs:   . Film/video editor (Medical):   Marland Kitchen Lack of Transportation (Non-Medical):   Physical Activity:   . Days of Exercise per Week:   . Minutes of Exercise per Session:   Stress:   . Feeling of Stress :   Social Connections:   . Frequency of Communication with Friends and Family:   . Frequency of Social Gatherings with Friends and Family:   . Attends Religious  Services:   . Active Member of Clubs or Organizations:   . Attends Archivist Meetings:   Marland Kitchen Marital Status:    Lives in a ***. Smoking: *** Occupation: ***  Environmental HistoryFreight forwarder in the house: Estate agent in the family room: {Blank single:19197::"yes","no"} Carpet in the bedroom: {Blank single:19197::"yes","no"} Heating: {Blank single:19197::"electric","gas"} Cooling: {Blank single:19197::"central","window"} Pet: {Blank single:19197::"yes ***","no"}  Family History: Family History  Problem Relation Age of Onset  . Diabetes Mother        Deceased  . Hypertension Mother   . Anemia  Mother   . Hyperlipidemia Mother   . Arthritis/Rheumatoid Mother   . Migraines Mother   . CVA Mother   . Hypertension Father        Deceased  . Stroke Father   . Diabetes Father        Legs Amputated  . Heart disease Father   . Arthritis Father   . Cancer Father   . Heart attack Maternal Aunt   . Diabetes Maternal Uncle   . Migraines Maternal Aunt   . Asthma Maternal Aunt   . Asthma Maternal Uncle   . Stomach cancer Sister   . Breast cancer Sister   . Uterine cancer Sister        or Ovarian  . Sleep apnea Brother   . COPD Brother   . Asthma Sister        x2  . Sudden death Brother        SIDS  . Hypertension Brother   . Diabetes Sister   . Asthma Daughter        x2  . Allergies Daughter   . Sickle cell trait Other        All Children  . Atrial fibrillation Daughter   . Anxiety disorder Daughter   . Allergies Son   . Hypertension Sister   . Heart disease Sister   . Asthma Brother    Problem                               Relation Asthma                                   *** Eczema                                *** Food allergy                          *** Allergic rhino conjunctivitis     ***  Review of Systems  Constitutional: Negative for appetite change, chills, fever and unexpected weight change.  HENT: Negative for congestion and rhinorrhea.   Eyes: Negative for itching.  Respiratory: Negative for cough, chest tightness, shortness of breath and wheezing.   Cardiovascular: Negative for chest pain.  Gastrointestinal: Negative for abdominal pain.  Genitourinary: Negative for difficulty urinating.  Skin: Negative for rash.  Neurological: Negative for headaches.   Objective: There were no vitals taken for this visit. There is no height or weight on file to calculate BMI. Physical Exam Vitals and nursing note reviewed.  Constitutional:      Appearance: Normal appearance. She is well-developed.  HENT:     Head: Normocephalic and atraumatic.  Right  Ear: External ear normal.     Left Ear: External ear normal.     Nose: Nose normal.     Mouth/Throat:     Mouth: Mucous membranes are moist.     Pharynx: Oropharynx is clear.  Eyes:     Conjunctiva/sclera: Conjunctivae normal.  Cardiovascular:     Rate and Rhythm: Normal rate and regular rhythm.     Heart sounds: Normal heart sounds. No murmur heard.  No friction rub. No gallop.   Pulmonary:     Effort: Pulmonary effort is normal.     Breath sounds: Normal breath sounds. No wheezing, rhonchi or rales.  Abdominal:     Palpations: Abdomen is soft.  Musculoskeletal:     Cervical back: Neck supple.  Skin:    General: Skin is warm.     Findings: No rash.  Neurological:     Mental Status: She is alert and oriented to person, place, and time.  Psychiatric:        Behavior: Behavior normal.    The plan was reviewed with the patient/family, and all questions/concerned were addressed.  It was my pleasure to see Natalie Vazquez today and participate in her care. Please feel free to contact me with any questions or concerns.  Sincerely,  Rexene Alberts, DO Allergy & Immunology  [facility], [facility], [facility] office: [phone] [facility] office: [place] office: [phone]

## 2019-11-28 ENCOUNTER — Ambulatory Visit: Payer: Federal, State, Local not specified - PPO | Admitting: Allergy

## 2019-12-01 DIAGNOSIS — H40033 Anatomical narrow angle, bilateral: Secondary | ICD-10-CM | POA: Diagnosis not present

## 2019-12-01 DIAGNOSIS — H04123 Dry eye syndrome of bilateral lacrimal glands: Secondary | ICD-10-CM | POA: Diagnosis not present

## 2019-12-01 DIAGNOSIS — H1045 Other chronic allergic conjunctivitis: Secondary | ICD-10-CM | POA: Diagnosis not present

## 2019-12-01 DIAGNOSIS — H2513 Age-related nuclear cataract, bilateral: Secondary | ICD-10-CM | POA: Diagnosis not present

## 2020-01-09 NOTE — Progress Notes (Signed)
New Patient Note  RE: Natalie Vazquez MRN: [id number] DOB: 08-Jan-1961 Date of Office Visit: 01/10/2020  Referring provider: Delorse Limber Primary care provider: Brunetta Jeans, PA-C  Chief Complaint: Allergy Testing  History of Present Illness: I had the pleasure of seeing Natalie Vazquez for initial evaluation at the [facility] on 01/10/2020. She is a 59 y.o. female, who is self-referred here for the re-evaluation of food and environmental allergies. Patient was seen by Dr. Shaune Leeks over 20 years ago.  Rhinitis: She reports symptoms of sneezing, itchy skin, itchy/watery eyes, nasal congestion, rhinorrhea. Symptoms have been going on for 20+ years. The symptoms are present mainly from spring through fall. Other triggers include exposure to unknown. Anosmia: no. Headache: sometimes. She has used zyrtec, pataday with fair improvement in symptoms. Patient does not like nasal sprays. Sinus infections: not recently. Previous work up includes: over 20 years ago positive to ragweed, roaches, dog, cat, dust per patient report. No previous allergy injections.  Previous ENT evaluation: no. Previous sinus imaging: no. History of nasal polyps: no. Last eye exam: this year. History of reflux: yes and takes PPI.  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  The reaction occurred at the age of 43, after she ate a lot of shrimp. Symptoms started within 15 minutes and was in the form of throat tightness, itchy throat. Denies any hives, abdominal pain, diarrhea, vomiting. Denies any associated cofactors such as exertion, infection, NSAID use. The symptoms lasted for a few hours. She was evaluated in ED and received epinephrine with good benefit. Since this episode, she does not report other accidental exposures to shellfish. Patient used to eat shellfish prior to this with no issues.   She does have access to epinephrine autoinjector and needed to use it.   Other  foods:  Grapes cause headaches.  Dairy causes abdominal bloating and constipation.   Fish cause vomiting.  Fresh peaches cause perioral pruritus. Tolerates processed forms with no issues.   Past work up includes: skin testing showed multiple positives per patient report. She also apparently had bloodwork in 2012 which showed multiple positives per patient report - results not available for review during OV.  Dietary History: patient has been eating other foods including eggs, peanut, treenuts, soy, wheat, meats, fruits and vegetables. No prior sesame ingestion.   She reports reading labels and avoiding shellfish, fish, fresh peaches, diary and grapes in diet completely. She tolerates baked egg and baked milk products.   Assessment and Plan: Natalie Vazquez is a 59 y.o. 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  She was evaluated in the ER and received epinephrine with good benefit.  Fish causes vomiting.  Fresh peaches cause perioral pruritus - tolerates processed forms with no issues.  Grapes cause headaches.  Dairy causes abdominal bloating and constipation.  Patient would like to get retested for all the foods.  Discussed with patient at length the difference between IgE and non-IgE mediated adverse food reactions and that there is no indication for testing for foods she is consuming without any issues.  However she still requesting to get tested for all the foods that we have available.  Today's skin testing showed: Positive to shellfish and borderline positive to mollusks. Negative to all other foods. Results given.  Continue strict avoidance of shellfish and fish.  Avoid other foods that are bothersome - grapes,  dairy, fresh peaches.  For mild symptoms you can take over the counter antihistamines such as Benadryl and monitor symptoms closely. If symptoms worsen or if you have severe symptoms including  breathing issues, throat closure, significant swelling, whole body hives, severe diarrhea and vomiting, lightheadedness then inject epinephrine and seek immediate medical care afterwards.  Food action plan given.   Pollen-food allergy  Regarding the fresh peaches.  Discussed that her food triggered oral and throat symptoms are likely caused by oral food allergy syndrome (OFAS). This is caused by cross reactivity of pollen with fresh fruits and vegetables, and nuts. Symptoms are usually localized in the form of itching and burning in mouth and throat. Very rarely it can progress to more severe symptoms. Eating foods in cooked or processed forms usually minimizes symptoms. I recommended avoidance of eating the problem foods, especially during the peak season(s). Sometimes, OFAS can induce severe throat swelling or even a systemic reaction. A list of common pollens and food cross-reactivities was provided to the patient.   Other allergic rhinitis Rhinoconjunctivitis symptoms for 20+ years mainly from spring through fall.  Used Zyrtec and Pataday with good benefit.  Declines nasal sprays.  Skin testing over 20 years ago showed positive to ragweed, roaches, dog, cat and dust per patient report.  No previous allergy injections.  Today's skin testing showed: Positive to grass, weed, trees, dust mites, cat, cockroach.   Start environmental control measures as below.  May use over the counter antihistamines such as Zyrtec (cetirizine), Claritin (loratadine), Allegra (fexofenadine), or Xyzal (levocetirizine) daily as needed. May take twice a day if needed.   May use olopatadine eye drops 0.2% once a day as needed for itchy/watery eyes.  Gave handout on proper skin care.  Mild intermittent asthma without complication Chest tightness when she gets overheated or overly excited.  Uses Xopenex with good benefit.  No recent prednisone use.  Today's spirometry was normal.  May use albuterol rescue inhaler  2 puffs every 4 to 6 hours as needed for shortness of breath, chest tightness, coughing, and wheezing. May use albuterol rescue inhaler 2 puffs 5 to 15 minutes prior to strenuous physical activities. Monitor frequency of use.   Return in about 1 year (around 01/09/2021).  Meds ordered this encounter  Medications  . Olopatadine HCl 0.2 % SOLN    Sig: Apply 1 drop to eye daily as needed (itchy/watery eyes).    Dispense:  2.5 mL    Refill:  11  . cetirizine (ZYRTEC ALLERGY) 10 MG tablet    Sig: Take 1 tablet (10 mg total) by mouth daily.    Dispense:  30 tablet    Refill:  11   Other allergy screening: Asthma: yes  Patient uses Xopenex if she gets overheated or overly excited as it causes chest tightness.  No prednisone for this recently.   Medication allergy: yes Hymenoptera allergy: no Urticaria: no Eczema:no History of recurrent infections suggestive of immunodeficency: no  Diagnostics: Spirometry:  Tracings reviewed. Her effort: Good reproducible efforts. FVC: 3.12L FEV1: 2.69L, 114% predicted FEV1/FVC ratio: 86% Interpretation: Spirometry consistent with normal pattern.  Please see scanned spirometry results for details.  Skin Testing: Environmental allergy panel and food panel Positive to grass, weed, trees, dust mites, cat, cockroach.  Positive to shellfish and borderline positive to mollusks. Negative to all other foods.  Results discussed with patient/family.  Airborne Adult Perc - 01/10/20 1049    Time Antigen Placed 1049    Allergen Manufacturer Lavella Hammock  Location Back    Number of Test 59    1. Control-Buffer 50% Glycerol Negative    2. Control-Histamine 1 mg/ml 2+    3. Albumin saline Negative    4. [place] 2+    5. [place] 2+    6. [place] 2+    7. [place] Blue 3+    8. Meadow Fescue 3+    9. Perennial Rye 4+    10. Sweet Vernal 4+    11. Timothy 4+    12. Cocklebur Negative    13. Burweed Marshelder Negative    14. Ragweed, short Negative    15.  Ragweed, Giant Negative    16. Plantain,  English 2+    17. Lamb's Quarters Negative    18. Sheep Sorrell Negative    19. Rough Pigweed Negative    20. Marsh Elder, Rough Negative    21. Mugwort, Common 2+    22. Ash mix Negative    23. Birch mix Negative    24. Beech American Negative    25. Box, Elder 2+    26. Cedar, red 3+    27. Cottonwood, [place] Negative    28. Elm mix Negative    29. Hickory 2+    30. Maple mix 3+    31. Oak, [place] mix 2+    32. Pecan Pollen 4+    33. Pine mix Negative    34. Sycamore Eastern Negative    35. [place], Black Pollen 4+    36. Alternaria alternata Negative    37. Cladosporium Herbarum Negative    38. Aspergillus mix Negative    39. Penicillium mix Negative    40. Bipolaris sorokiniana (Helminthosporium) Negative    41. Drechslera spicifera (Curvularia) Negative    42. Mucor plumbeus Negative    43. Fusarium moniliforme Negative    44. Aureobasidium pullulans (pullulara) Negative    45. Rhizopus oryzae Negative    46. Botrytis cinera Negative    47. Epicoccum nigrum Negative    48. Phoma betae Negative    49. Candida Albicans Negative    50. Trichophyton mentagrophytes Negative    51. Mite, D Farinae  5,000 AU/ml 4+    52. Mite, D Pteronyssinus  5,000 AU/ml 4+    53. Cat Hair 10,000 BAU/ml 4+    54.  Dog Epithelia Negative    55. Mixed Feathers Negative    56. Horse Epithelia Negative    57. Cockroach, German 4+    58. Mouse Negative    59. Tobacco Leaf Negative          Intradermal - 01/10/20 1129    Time Antigen Placed 1129    Allergen Manufacturer Lavella Hammock    Location Arm    Number of Test 7    Intradermal Select    Control Negative    Ragweed mix 2+    Mold 1 Negative    Mold 2 Negative    Mold 3 Negative    Mold 4 Negative    Dog Negative          Food Adult Perc - 01/10/20 1000    Time Antigen Placed 1049    Allergen Manufacturer Greer    Location Back    Number of allergen test 72    1. Peanut Negative     2. Soybean Negative    3. Wheat Negative    4. Sesame Negative    5. Milk, cow Negative    6. Egg White, Chicken Negative  7. Casein Negative    8. Shellfish Mix --   9x6   9. Fish Mix Negative    10. Cashew Negative    11. Pecan Food Negative    12. Ridge Negative    13. Almond Negative    14. Hazelnut Negative    15. [place] nut Negative    16. Coconut Negative    17. Pistachio Negative    18. Catfish Negative    19. Bass Negative    20. Trout Negative    21. Tuna Negative    22. Salmon Negative    23. Flounder Negative    24. Codfish Negative    25. Shrimp --   12x5   26. Crab --   8x5   27. Lobster --   13x8   28. Oyster --   +/-   29. Scallops --   +/-   30. Barley Negative    31. Oat  Negative    32. Rye  Negative    33. Hops Negative    34. Rice Negative    35. Cottonseed Negative    36. Saccharomyces Cerevisiae  Negative    37. Pork Negative    38. [place] Meat Negative    39. Chicken Meat Negative    40. Beef Negative    41. Lamb Negative    42. Tomato Negative    43. White Potato Negative    44. Sweet Potato Negative    45. Pea, Green/English Negative    46. Navy Bean Negative    47. Mushrooms Negative    48. Avocado Negative    49. Onion Negative    50. Cabbage Negative    51. Carrots Negative    52. Celery Negative    53. Corn Negative    54. Cucumber Negative    55. Grape (White seedless) Negative    56. Orange  Negative    57. Banana Negative    58. Apple Negative    59. Peach Negative    60. Strawberry Negative    61. Cantaloupe Negative    62. Watermelon Negative    63. Pineapple Negative    64. Chocolate/Cacao bean Negative    65. Karaya Gum Negative    66. Acacia (Arabic Gum) Negative    67. Cinnamon Negative    68. Nutmeg Negative    69. Ginger Negative    70. Garlic Negative    71. Pepper, black Negative    72. Mustard Negative           Past Medical History: Patient Active Problem List   Diagnosis Date Noted  .  Adverse food reaction 01/10/2020  . Pollen-food allergy 01/10/2020  . Mild intermittent asthma without complication 62/37/6283  . Allergic conjunctivitis of both eyes 01/10/2020  . Other allergic rhinitis 01/10/2020  . Morbid obesity due to excess calories ([place]) 05/02/2019  . Iron deficiency 05/02/2019  . Obesity (BMI 30-39.9) 08/19/2018  . Pre-diabetes 02/18/2018  . OSA (obstructive sleep apnea) 05/28/2017  . Family history of breast cancer 12/25/2015  . Migraines 09/28/2013  . Visit for preventive health examination 09/10/2013  . GERD (gastroesophageal reflux disease) 03/19/2013  . Seasonal allergies 03/19/2013  . Neck pain, chronic 03/19/2013  . IBS (irritable bowel syndrome) 03/19/2013   Past Medical History:  Diagnosis Date  . Allergic rhinitis due to other allergen   . Allergy    seasonal  . Anemia   . Arthritis   . Asthma    mild,  intermittent  . Chicken pox   . Cholelithiasis   . CTS (carpal tunnel syndrome)   . Diabetes mellitus without complication ([place])   . Fibroids   . Fibromyalgia   . Frequent urination   . GERD (gastroesophageal reflux disease)   . Headache(784.0)   . Migraine   . PONV (postoperative nausea and vomiting)    difficulty waking up  . Problems related to high-risk sexual behavior    Unprotected sex  . Symptomatic menopausal or female climacteric states    Past Surgical History: Past Surgical History:  Procedure Laterality Date  . ABDOMINAL HYSTERECTOMY  2008   partial, ovaries remain  . CARPAL TUNNEL RELEASE Right 2015  . CHOLECYSTECTOMY  05/19/2011   Procedure: LAPAROSCOPIC CHOLECYSTECTOMY;  Surgeon: Zenovia Jarred, MD;  Location: [place];  Service: General;  Laterality: N/A;  . LASER ABLATION [place] / VULVAR    . PERCUTANEOUS PINNING  01/22/2012   Procedure: PERCUTANEOUS PINNING EXTREMITY;  Surgeon: Marybelle Killings, MD;  Location: [place];  Service: Orthopedics;  Laterality: Right;  Closed Reduction and Pinning right 5th  Metacarpal Fracture  . TUBAL LIGATION    . WISDOM TOOTH EXTRACTION     Medication List:  Current Outpatient Medications  Medication Sig Dispense Refill  . Bee Pollen 1000 MG TABS Take 1 tablet by mouth.    . Biotin 1000 MCG tablet Take 1,000 mcg by mouth daily. Three times a week    . Cholecalciferol (VITAMIN D-3) 25 MCG (1000 UT) CAPS Take 1 capsule (1,000 Units total) by mouth daily. 30 capsule 5  . cyclobenzaprine (FLEXERIL) 10 MG tablet 1 tablet PO QHS PRN 30 tablet 0  . levalbuterol (XOPENEX HFA) 45 MCG/ACT inhaler INHALE 1 TO 2 PUFFS BY MOUTH EVERY 6 HOURS AS NEEDED FOR WHEEZING 15 g 5  . NON FORMULARY Take 3 tablets by mouth as needed. Swiss Kriss Herbal Laxative Tab    . pantoprazole (PROTONIX) 20 MG tablet Take by mouth.    . SUMAtriptan (IMITREX) 100 MG tablet Take one tablet by mouth onset of headache, May repeat in 2 hours if headache persists or recurs. 10 tablet 0  . Turmeric 500 MG CAPS Take 1 tablet by mouth daily.    . vitamin E 180 MG (400 UNITS) capsule Take 1 capsule by mouth daily.    Marland Kitchen zonisamide (ZONEGRAN) 25 MG capsule Take 1 capsule by mouth daily.  5  . cetirizine (ZYRTEC ALLERGY) 10 MG tablet Take 1 tablet (10 mg total) by mouth daily. 30 tablet 11  . esomeprazole (NEXIUM) 40 MG capsule TAKE 1 CAPSULE BY MOUTH EVERY MORNING BEFORE BREAKFAST (Patient not taking: Reported on 01/10/2020) 90 capsule 1  . Olopatadine HCl 0.2 % SOLN Apply 1 drop to eye daily as needed (itchy/watery eyes). 2.5 mL 11   No current facility-administered medications for this visit.   Allergies: Allergies  Allergen Reactions  . Iodine Anaphylaxis  . Shellfish Allergy Anaphylaxis  . Latex Rash   Social History: Social History   Socioeconomic History  . Marital status: Single    Spouse name: Not on file  . Number of children: Not on file  . Years of education: Not on file  . Highest education level: Not on file  Occupational History  . Not on file  Tobacco Use  . Smoking status:  Never Smoker  . Smokeless tobacco: Never Used  Vaping Use  . Vaping Use: Never used  Substance and Sexual Activity  . Alcohol use: Yes  Comment: rarely   . Drug use: No  . Sexual activity: Never    Birth control/protection: None, Abstinence, Surgical  Other Topics Concern  . Not on file  Social History Narrative  . Not on file   Social Determinants of Health   Financial Resource Strain:   . Difficulty of Paying Living Expenses: Not on file  Food Insecurity:   . Worried About Charity fundraiser in the Last Year: Not on file  . Ran Out of Food in the Last Year: Not on file  Transportation Needs:   . Lack of Transportation (Medical): Not on file  . Lack of Transportation (Non-Medical): Not on file  Physical Activity:   . Days of Exercise per Week: Not on file  . Minutes of Exercise per Session: Not on file  Stress:   . Feeling of Stress : Not on file  Social Connections:   . Frequency of Communication with Friends and Family: Not on file  . Frequency of Social Gatherings with Friends and Family: Not on file  . Attends Religious Services: Not on file  . Active Member of Clubs or Organizations: Not on file  . Attends Archivist Meetings: Not on file  . Marital Status: Not on file   Lives in a house built in [place]. Smoking: denies Occupation: Therapist, music.   Environmental History: Water Damage/mildew in the house: no Carpet in the family room: no Carpet in the bedroom: no Heating: gas Cooling: central Pet: yes 1 dog x 3 months  Family History: Family History  Problem Relation Age of Onset  . Diabetes Mother        Deceased  . Hypertension Mother   . Anemia Mother   . Hyperlipidemia Mother   . Arthritis/Rheumatoid Mother   . Migraines Mother   . CVA Mother   . Hypertension Father        Deceased  . Stroke Father   . Diabetes Father        Legs Amputated  . Heart disease Father   . Arthritis Father   . Cancer Father   . Heart attack  Maternal Aunt   . Diabetes Maternal Uncle   . Migraines Maternal Aunt   . Asthma Maternal Aunt   . Asthma Maternal Uncle   . Stomach cancer Sister   . Breast cancer Sister   . Uterine cancer Sister        or Ovarian  . Sleep apnea Brother   . COPD Brother   . Asthma Sister        x2  . Sudden death Brother        SIDS  . Hypertension Brother   . Diabetes Sister   . Asthma Daughter        x2  . Allergies Daughter   . Sickle cell trait Other        All Children  . Atrial fibrillation Daughter   . Anxiety disorder Daughter   . Allergies Son   . Hypertension Sister   . Heart disease Sister   . Asthma Brother    Review of Systems  Constitutional: Negative for appetite change, chills, fever and unexpected weight change.  HENT: Negative for congestion and rhinorrhea.   Eyes: Negative for itching.  Respiratory: Negative for cough, chest tightness, shortness of breath and wheezing.   Cardiovascular: Negative for chest pain.  Gastrointestinal: Positive for constipation. Negative for abdominal pain.  Genitourinary: Negative for difficulty urinating.  Skin: Negative for rash.  Allergic/Immunologic: Positive for environmental allergies and food allergies.  Neurological: Positive for headaches.   Objective: BP (!) 150/72   Pulse 67   Temp 97.7 F (36.5 C) (Temporal)   Resp 16   Ht 5\' 6"  (1.676 m)   Wt 205 lb 12.8 oz (93.4 kg)   SpO2 99%   BMI 33.22 kg/m  Body mass index is 33.22 kg/m. Physical Exam Vitals and nursing note reviewed.  Constitutional:      Appearance: Normal appearance. She is well-developed.  HENT:     Head: Normocephalic and atraumatic.     Right Ear: External ear normal.     Left Ear: External ear normal.     Nose: Nose normal.     Mouth/Throat:     Mouth: Mucous membranes are moist.     Pharynx: Oropharynx is clear.  Eyes:     Conjunctiva/sclera: Conjunctivae normal.  Cardiovascular:     Rate and Rhythm: Normal rate and regular rhythm.      Heart sounds: Normal heart sounds. No murmur heard.  No friction rub. No gallop.   Pulmonary:     Effort: Pulmonary effort is normal.     Breath sounds: Normal breath sounds. No wheezing, rhonchi or rales.  Musculoskeletal:     Cervical back: Neck supple.  Skin:    General: Skin is warm and dry.     Findings: No rash.  Neurological:     Mental Status: She is alert and oriented to person, place, and time.  Psychiatric:        Behavior: Behavior normal.    The plan was reviewed with the patient/family, and all questions/concerned were addressed.  It was my pleasure to see Natalie Vazquez today and participate in her care. Please feel free to contact me with any questions or concerns.  Sincerely,  Rexene Alberts, DO Allergy & Immunology  [facility] office: [place] office: [phone]

## 2020-01-10 ENCOUNTER — Encounter: Payer: Self-pay | Admitting: Allergy

## 2020-01-10 ENCOUNTER — Ambulatory Visit: Payer: Federal, State, Local not specified - PPO | Admitting: Allergy

## 2020-01-10 ENCOUNTER — Other Ambulatory Visit: Payer: Self-pay

## 2020-01-10 VITALS — BP 150/72 | HR 67 | Temp 97.7°F | Resp 16 | Ht 66.0 in | Wt 205.8 lb

## 2020-01-10 DIAGNOSIS — T781XXD Other adverse food reactions, not elsewhere classified, subsequent encounter: Secondary | ICD-10-CM

## 2020-01-10 DIAGNOSIS — H1013 Acute atopic conjunctivitis, bilateral: Secondary | ICD-10-CM | POA: Diagnosis not present

## 2020-01-10 DIAGNOSIS — J4520 Mild intermittent asthma, uncomplicated: Secondary | ICD-10-CM

## 2020-01-10 DIAGNOSIS — T781XXA Other adverse food reactions, not elsewhere classified, initial encounter: Secondary | ICD-10-CM | POA: Insufficient documentation

## 2020-01-10 DIAGNOSIS — J3089 Other allergic rhinitis: Secondary | ICD-10-CM

## 2020-01-10 MED ORDER — CETIRIZINE HCL 10 MG PO TABS: 10.0000 mg | ORAL_TABLET | Freq: Every day | ORAL | 11 refills | Status: DC

## 2020-01-10 MED ORDER — OLOPATADINE HCL 0.2 % OP SOLN: 1.0000 [drp] | Freq: Every day | OPHTHALMIC | 11 refills | Status: DC | PRN

## 2020-01-10 NOTE — Assessment & Plan Note (Signed)
Rhinoconjunctivitis symptoms for 20+ years mainly from spring through fall.  Used Zyrtec and Pataday with good benefit.  Declines nasal sprays.  Skin testing over 20 years ago showed positive to ragweed, roaches, dog, cat and dust per patient report.  No previous allergy injections.  Today's skin testing showed: Positive to grass, weed, trees, dust mites, cat, cockroach.   Start environmental control measures as below.  May use over the counter antihistamines such as Zyrtec (cetirizine), Claritin (loratadine), Allegra (fexofenadine), or Xyzal (levocetirizine) daily as needed. May take twice a day if needed.   May use olopatadine eye drops 0.2% once a day as needed for itchy/watery eyes.  Gave handout on proper skin care.

## 2020-01-10 NOTE — Patient Instructions (Addendum)
Today's skin testing showed: Positive to grass, weed, trees, dust mites, cat, cockroach.  Positive to shellfish and borderline positive to mollusks. Negative to all other foods.  Results given.  Food:   Continue strict avoidance of shellfish and fish.  Avoid other foods that are bothersome - grapes, dairy, fresh peaches.  Discussed that her food triggered oral and throat symptoms are likely caused by oral food allergy syndrome (OFAS). This is caused by cross reactivity of pollen with fresh fruits and vegetables, and nuts. Symptoms are usually localized in the form of itching and burning in mouth and throat. Very rarely it can progress to more severe symptoms. Eating foods in cooked or processed forms usually minimizes symptoms. I recommended avoidance of eating the problem foods, especially during the peak season(s). Sometimes, OFAS can induce severe throat swelling or even a systemic reaction. A list of common pollens and food cross-reactivities was provided to the patient.   For mild symptoms you can take over the counter antihistamines such as Benadryl and monitor symptoms closely. If symptoms worsen or if you have severe symptoms including breathing issues, throat closure, significant swelling, whole body hives, severe diarrhea and vomiting, lightheadedness then inject epinephrine and seek immediate medical care afterwards.  Food action plan given.   Environmental allergies  Start environmental control measures as below.  May use over the counter antihistamines such as Zyrtec (cetirizine), Claritin (loratadine), Allegra (fexofenadine), or Xyzal (levocetirizine) daily as needed. May take twice a day if needed.   May use olopatadine eye drops 0.2% once a day as needed for itchy/watery eyes.  Skin:  See below for proper skin care.   Asthma:   Today's breathing test was normal.  May use albuterol rescue inhaler 2 puffs every 4 to 6 hours as needed for shortness of breath, chest  tightness, coughing, and wheezing. May use albuterol rescue inhaler 2 puffs 5 to 15 minutes prior to strenuous physical activities. Monitor frequency of use.   Follow up in 12 months or sooner if needed.   Reducing Pollen Exposure  Pollen seasons: trees (spring), grass (summer) and ragweed/weeds (fall).  Keep windows closed in your home and car to lower pollen exposure.   Install air conditioning in the bedroom and throughout the house if possible.   Avoid going out in dry windy days - especially early morning.  Pollen counts are highest between 5 - 10 AM and on dry, hot and windy days.   Save outside activities for late afternoon or after a heavy rain, when pollen levels are lower.   Avoid mowing of grass if you have grass pollen allergy.  Be aware that pollen can also be transported indoors on people and pets.   Dry your clothes in an automatic dryer rather than hanging them outside where they might collect pollen.   Rinse hair and eyes before bedtime. Control of House Dust Mite Allergen  Dust mite allergens are a common trigger of allergy and asthma symptoms. While they can be found throughout the house, these microscopic creatures thrive in warm, humid environments such as bedding, upholstered furniture and carpeting.  Because so much time is spent in the bedroom, it is essential to reduce mite levels there.   Encase pillows, mattresses, and box springs in special allergen-proof fabric covers or airtight, zippered plastic covers.   Bedding should be washed weekly in hot water (130 F) and dried in a hot dryer. Allergen-proof covers are available for comforters and pillows that cant be regularly washed.  Wash the allergy-proof covers every few months. Minimize clutter in the bedroom. Keep pets out of the bedroom.   Keep humidity less than 50% by using a dehumidifier or air conditioning. You can buy a humidity measuring device called a hygrometer to monitor this.   If  possible, replace carpets with hardwood, linoleum, or washable area rugs. If that's not possible, vacuum frequently with a vacuum that has a HEPA filter.  Remove all upholstered furniture and non-washable window drapes from the bedroom.  Remove all non-washable stuffed toys from the bedroom.  Wash stuffed toys weekly. Pet Allergen Avoidance:  Contrary to popular opinion, there are no hypoallergenic breeds of dogs or cats. That is because people are not allergic to an animals hair, but to an allergen found in the animal's saliva, dander (dead skin flakes) or urine. Pet allergy symptoms typically occur within minutes. For some people, symptoms can build up and become most severe 8 to 12 hours after contact with the animal. People with severe allergies can experience reactions in public places if dander has been transported on the pet owners clothing.  Keeping an animal outdoors is only a partial solution, since homes with pets in the yard still have higher concentrations of animal allergens.  Before getting a pet, ask your allergist to determine if you are allergic to animals. If your pet is already considered part of your family, try to minimize contact and keep the pet out of the bedroom and other rooms where you spend a great deal of time.  As with dust mites, vacuum carpets often or replace carpet with a hardwood floor, tile or linoleum.  High-efficiency particulate air (HEPA) cleaners can reduce allergen levels over time.  While dander and saliva are the source of cat and dog allergens, urine is the source of allergens from rabbits, hamsters, mice and [place] pigs; so ask a non-allergic family member to clean the animals cage.  If you have a pet allergy, talk to your allergist about the potential for allergy immunotherapy (allergy shots). This strategy can often provide long-term relief. Cockroach Allergen Avoidance Cockroaches are often found in the homes of densely populated urban  areas, schools or commercial buildings, but these creatures can lurk almost anywhere. This does not mean that you have a dirty house or living area.  Block all areas where roaches can enter the home. This includes crevices, wall cracks and windows.   Cockroaches need water to survive, so fix and seal all leaky faucets and pipes. Have an exterminator go through the house when your family and pets are gone to eliminate any remaining roaches.  Keep food in lidded containers and put pet food dishes away after your pets are done eating. Vacuum and sweep the floor after meals, and take out garbage and recyclables. Use lidded garbage containers in the kitchen. Wash dishes immediately after use and clean under stoves, refrigerators or toasters where crumbs can accumulate. Wipe off the stove and other kitchen surfaces and cupboards regularly.    Skin care recommendations  Bath time:  Always use lukewarm water. AVOID very hot or cold water.  Keep bathing time to 5-10 minutes.  Do NOT use bubble bath.  Use a mild soap and use just enough to wash the dirty areas.  Do NOT scrub skin vigorously.   After bathing, pat dry your skin with a towel. Do NOT rub or scrub the skin.  Moisturizers and prescriptions:   ALWAYS apply moisturizers immediately after bathing (within 3 minutes). This helps  to lock-in moisture.  Use the moisturizer several times a day over the whole body.  Good summer moisturizers include: Aveeno, CeraVe, Cetaphil.  Good winter moisturizers include: Aquaphor, Vaseline, Cerave, Cetaphil, Eucerin, Vanicream.  When using moisturizers along with medications, the moisturizer should be applied about one hour after applying the medication to prevent diluting effect of the medication or moisturize around where you applied the medications. When not using medications, the moisturizer can be continued twice daily as maintenance.  Laundry and clothing:  Avoid laundry products with added  color or perfumes.  Use unscented hypo-allergenic laundry products such as Tide free, Cheer free & gentle, and All free and clear.   If the skin still seems dry or sensitive, you can try double-rinsing the clothes.  Avoid tight or scratchy clothing such as wool.  Do not use fabric softeners or dyer sheets.

## 2020-01-10 NOTE — Assessment & Plan Note (Signed)
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  She was evaluated in the ER and received epinephrine with good benefit.  Fish causes vomiting.  Fresh peaches cause perioral pruritus - tolerates processed forms with no issues.  Grapes cause headaches.  Dairy causes abdominal bloating and constipation.  Patient would like to get retested for all the foods.  Discussed with patient at length the difference between IgE and non-IgE mediated adverse food reactions and that there is no indication for testing for foods she is consuming without any issues.  However she still requesting to get tested for all the foods that we have available.  Today's skin testing showed: Positive to shellfish and borderline positive to mollusks. Negative to all other foods. Results given.  Continue strict avoidance of shellfish and fish.  Avoid other foods that are bothersome - grapes, dairy, fresh peaches.  For mild symptoms you can take over the counter antihistamines such as Benadryl and monitor symptoms closely. If symptoms worsen or if you have severe symptoms including breathing issues, throat closure, significant swelling, whole body hives, severe diarrhea and vomiting, lightheadedness then inject epinephrine and seek immediate medical care afterwards.  Food action plan given.

## 2020-01-10 NOTE — Assessment & Plan Note (Signed)
Chest tightness when she gets overheated or overly excited.  Uses Xopenex with good benefit.  No recent prednisone use.  Today's spirometry was normal.  May use albuterol rescue inhaler 2 puffs every 4 to 6 hours as needed for shortness of breath, chest tightness, coughing, and wheezing. May use albuterol rescue inhaler 2 puffs 5 to 15 minutes prior to strenuous physical activities. Monitor frequency of use.

## 2020-01-10 NOTE — Assessment & Plan Note (Signed)
   Regarding the fresh peaches.  Discussed that her food triggered oral and throat symptoms are likely caused by oral food allergy syndrome (OFAS). This is caused by cross reactivity of pollen with fresh fruits and vegetables, and nuts. Symptoms are usually localized in the form of itching and burning in mouth and throat. Very rarely it can progress to more severe symptoms. Eating foods in cooked or processed forms usually minimizes symptoms. I recommended avoidance of eating the problem foods, especially during the peak season(s). Sometimes, OFAS can induce severe throat swelling or even a systemic reaction. A list of common pollens and food cross-reactivities was provided to the patient.

## 2020-02-03 IMAGING — CR DG CHEST 2V
2 series · 2 of 2 positions shown · non-contrast
Comparison: Prior radiograph from 01/09/2016.

CLINICAL DATA: Initial evaluation for acute shortness of breath.
Left arm numbness and pain.

EXAM:
CHEST - 2 VIEW

[w chest pa]
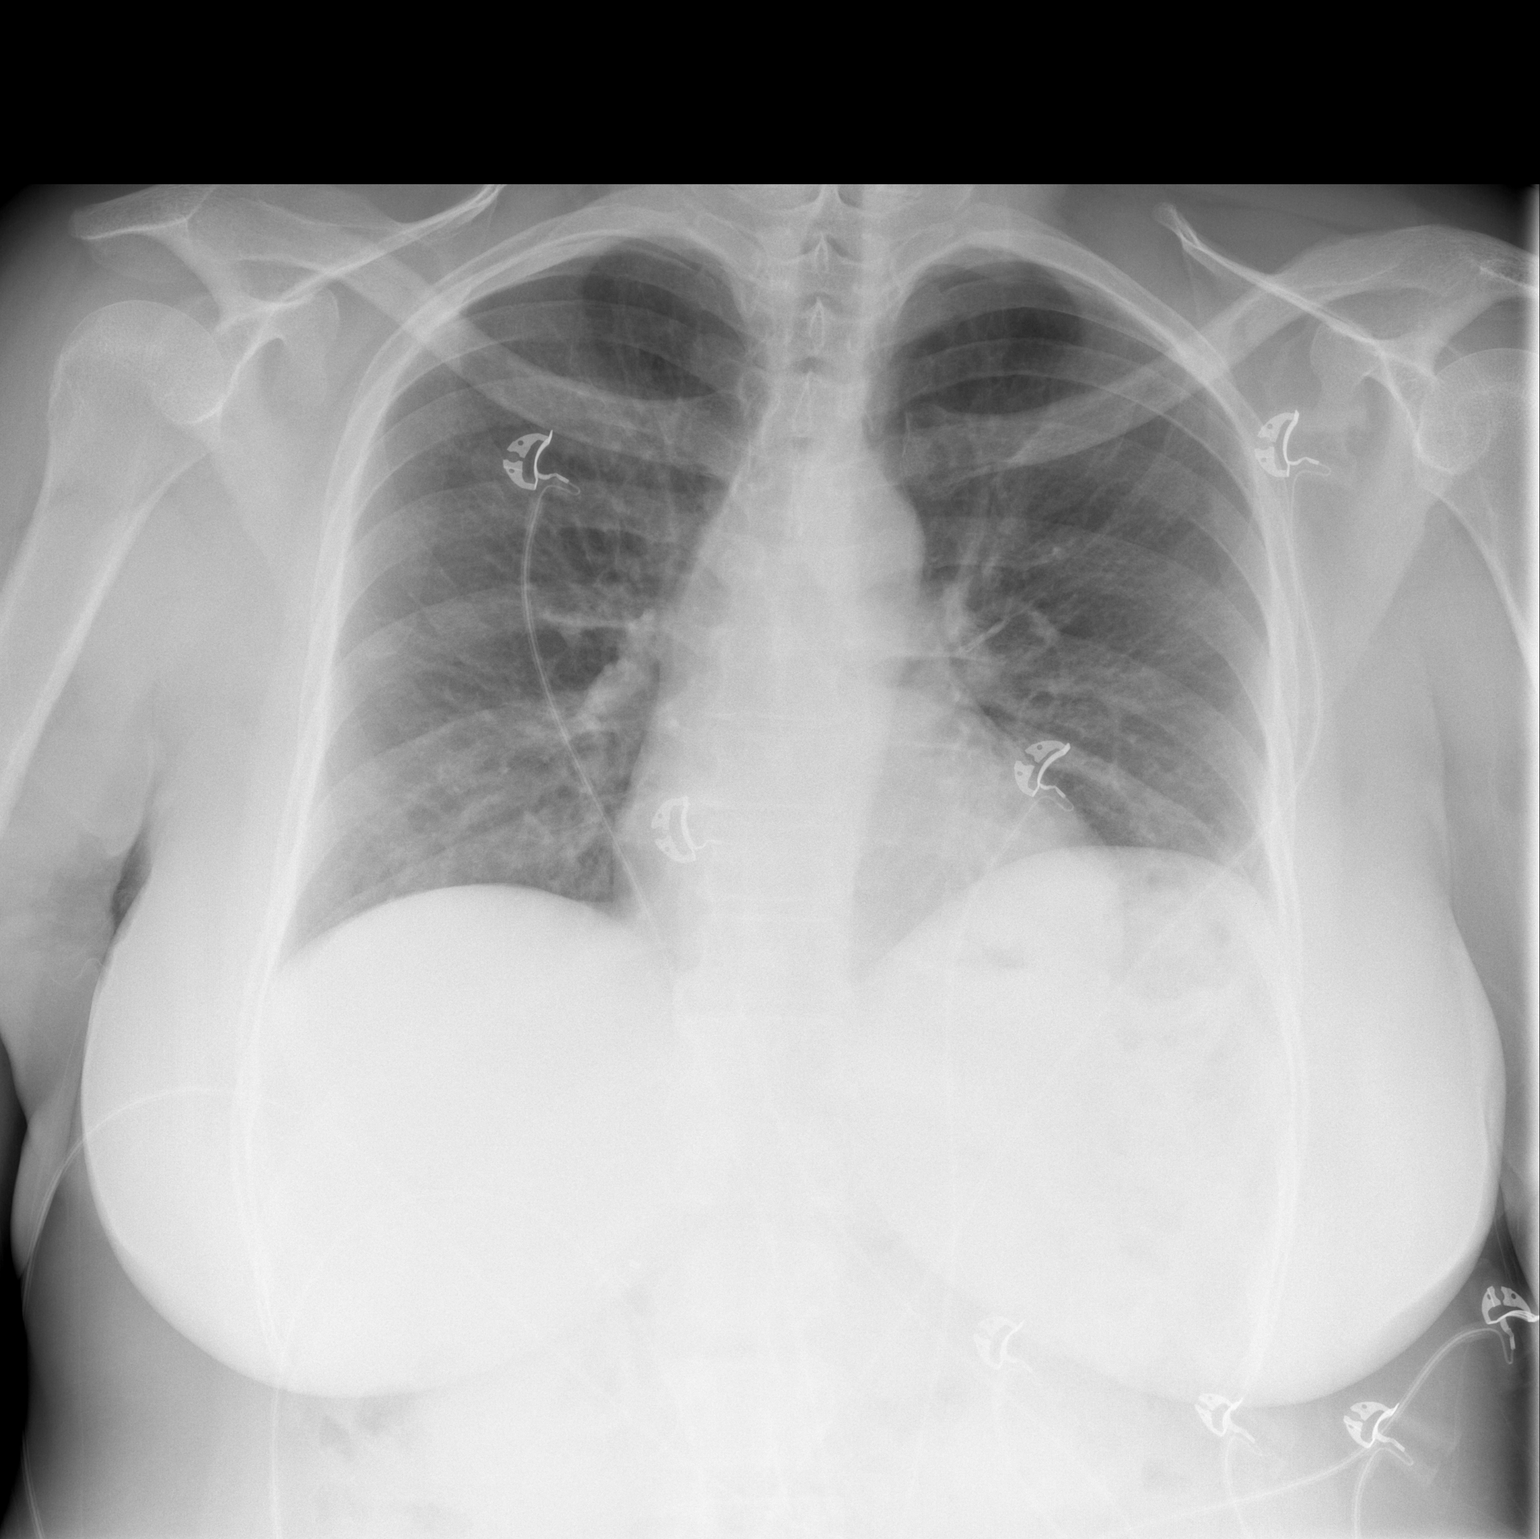

[w chest lat]
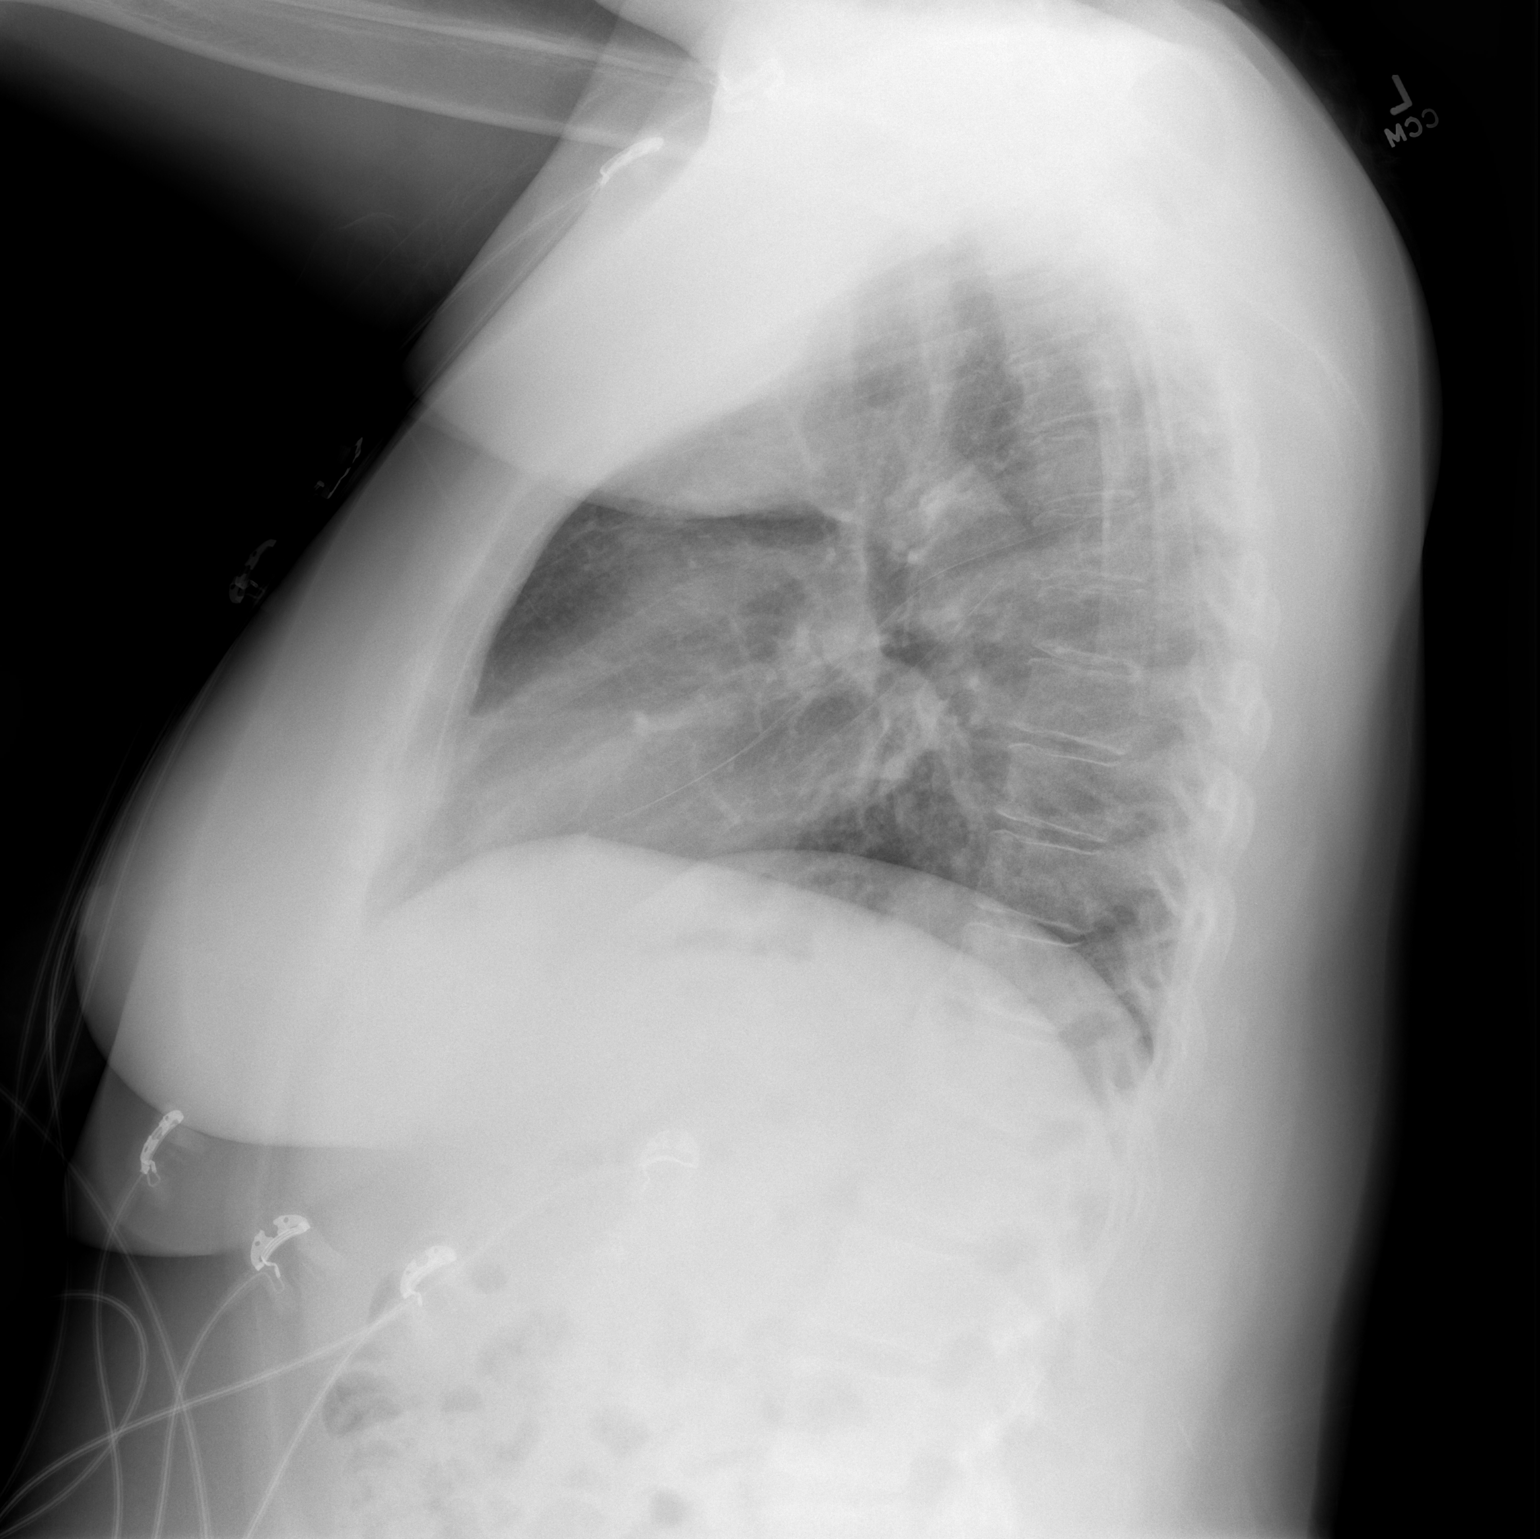

[2 of 2 positions shown; findings below may reference images not displayed]

FINDINGS: The cardiac and mediastinal silhouettes are within normal limits.

The lungs are mildly hypoinflated with elevation of the left
hemidiaphragm. No airspace consolidation, pleural effusion, or
pulmonary edema is identified. There is no pneumothorax.

No acute osseous abnormality identified.
IMPRESSION: No active cardiopulmonary disease.

## 2020-03-11 ENCOUNTER — Encounter: Payer: Self-pay | Admitting: Physician Assistant

## 2020-03-11 ENCOUNTER — Encounter: Payer: Self-pay | Admitting: Podiatry

## 2020-03-14 ENCOUNTER — Telehealth: Payer: Self-pay | Admitting: Podiatry

## 2020-03-14 DIAGNOSIS — Z20822 Contact with and (suspected) exposure to covid-19: Secondary | ICD-10-CM | POA: Diagnosis not present

## 2020-03-14 NOTE — Telephone Encounter (Signed)
lvm for patient to return call to get appointment scheduled

## 2020-03-19 ENCOUNTER — Ambulatory Visit: Payer: Federal, State, Local not specified - PPO | Admitting: Podiatry

## 2020-03-19 ENCOUNTER — Encounter: Payer: Self-pay | Admitting: Podiatry

## 2020-03-19 ENCOUNTER — Other Ambulatory Visit: Payer: Self-pay

## 2020-03-19 VITALS — BP 106/67 | HR 86

## 2020-03-19 DIAGNOSIS — D3613 Benign neoplasm of peripheral nerves and autonomic nervous system of lower limb, including hip: Secondary | ICD-10-CM | POA: Diagnosis not present

## 2020-03-19 DIAGNOSIS — D3610 Benign neoplasm of peripheral nerves and autonomic nervous system, unspecified: Secondary | ICD-10-CM

## 2020-03-19 MED ORDER — BETAMETHASONE SOD PHOS & ACET 6 (3-3) MG/ML IJ SUSP
3.0000 mg | Freq: Once | INTRAMUSCULAR | Status: AC
  Administered 2020-03-19: 3 mg

## 2020-03-19 NOTE — Progress Notes (Signed)
  Subjective:  Patient ID: KARON COTTERILL, female    DOB: 1961-04-03,  MRN: [id number]  Chief Complaint  Patient presents with  . Foot Problem    Bilateral sensation of "blood flowing in my feet", right worse than left, 2 week duration, no known injuiries.     59 y.o. female presents with the above complaint. History confirmed with patient.   Objective:  Physical Exam: warm, good capillary refill, no trophic changes or ulcerative lesions, normal DP and PT pulses and normal sensory exam. Left Foot: normal exam, no swelling, tenderness, instability; ligaments intact, full range of motion of all ankle/foot joints  Right Foot: POP 3rd interspace right, pain medial foot at 1st MPJ with prominent bunion.   Assessment:   1. Neuroma      Plan:  Patient was evaluated and treated and all questions answered.  Interdigital Neuroma -Educated on etiology -Educated on padding and proper shoegear -XR reviewed with patient -Injection delivered to the affected interspaces  Procedure: Neuroma Injection Location: Right 3rd interspace Skin Prep: Alcohol. Injectate: 0.5 cc 0.5% marcaine plain, 0.5 cc celestone Disposition: Patient tolerated procedure well. Injection site dressed with a band-aid.  Return in about 4 weeks (around 04/16/2020) for Neuroma, Right.

## 2020-04-19 ENCOUNTER — Ambulatory Visit: Payer: Federal, State, Local not specified - PPO | Admitting: Podiatry

## 2020-04-22 DIAGNOSIS — Z20822 Contact with and (suspected) exposure to covid-19: Secondary | ICD-10-CM | POA: Diagnosis not present

## 2020-04-23 ENCOUNTER — Other Ambulatory Visit: Payer: Self-pay

## 2020-04-23 ENCOUNTER — Ambulatory Visit (INDEPENDENT_AMBULATORY_CARE_PROVIDER_SITE_OTHER): Payer: Federal, State, Local not specified - PPO | Admitting: Podiatry

## 2020-04-23 DIAGNOSIS — D3610 Benign neoplasm of peripheral nerves and autonomic nervous system, unspecified: Secondary | ICD-10-CM | POA: Diagnosis not present

## 2020-04-23 MED ORDER — BETAMETHASONE SOD PHOS & ACET 6 (3-3) MG/ML IJ SUSP
6.0000 mg | Freq: Once | INTRAMUSCULAR | Status: AC
  Administered 2020-04-23: 6 mg

## 2020-04-23 NOTE — Progress Notes (Signed)
  Subjective:  Patient ID: Natalie Vazquez, female    DOB: 1960-07-01,  MRN: [id number]  Chief Complaint  Patient presents with  . Neuroma    Pt states still experiencing discomfort in her right foot neuroma. Pt states injection was briefly helpful.   60 y.o. female presents with the above complaint. History confirmed with patient.   Objective:  Physical Exam: warm, good capillary refill, no trophic changes or ulcerative lesions, normal DP and PT pulses and normal sensory exam. Left Foot: normal exam, no swelling, tenderness, instability; ligaments intact, full range of motion of all ankle/foot joints  Right Foot: POP 3rd interspace right, pain medial foot at 1st MPJ with prominent bunion.   Assessment:   1. Neuroma    Plan:  Patient was evaluated and treated and all questions answered.  Interdigital Neuroma -Repeat injection as below  Procedure: Neuroma Injection Location: Right 3rd interspace Skin Prep: Alcohol. Injectate: 0.5 cc 0.5% marcaine plain, 0.5 cc celestone. Disposition: Patient tolerated procedure well. Injection site dressed with a band-aid.  Return in about 4 weeks (around 05/21/2020) for Neuroma.

## 2020-05-02 DIAGNOSIS — R1310 Dysphagia, unspecified: Secondary | ICD-10-CM | POA: Diagnosis not present

## 2020-05-02 DIAGNOSIS — Z1211 Encounter for screening for malignant neoplasm of colon: Secondary | ICD-10-CM | POA: Diagnosis not present

## 2020-05-02 DIAGNOSIS — E669 Obesity, unspecified: Secondary | ICD-10-CM | POA: Diagnosis not present

## 2020-05-02 DIAGNOSIS — K219 Gastro-esophageal reflux disease without esophagitis: Secondary | ICD-10-CM | POA: Diagnosis not present

## 2020-05-24 ENCOUNTER — Ambulatory Visit (INDEPENDENT_AMBULATORY_CARE_PROVIDER_SITE_OTHER): Payer: Federal, State, Local not specified - PPO | Admitting: Podiatry

## 2020-05-24 DIAGNOSIS — Z5329 Procedure and treatment not carried out because of patient's decision for other reasons: Secondary | ICD-10-CM

## 2020-05-29 ENCOUNTER — Ambulatory Visit: Payer: Federal, State, Local not specified - PPO | Admitting: Physician Assistant

## 2020-05-29 DIAGNOSIS — Z0289 Encounter for other administrative examinations: Secondary | ICD-10-CM

## 2020-05-30 ENCOUNTER — Ambulatory Visit: Payer: Federal, State, Local not specified - PPO | Admitting: Physician Assistant

## 2020-05-30 ENCOUNTER — Other Ambulatory Visit: Payer: Self-pay

## 2020-05-30 ENCOUNTER — Encounter: Payer: Self-pay | Admitting: Physician Assistant

## 2020-05-30 VITALS — BP 118/66 | HR 69 | Temp 98.0°F | Ht 66.0 in | Wt 206.0 lb

## 2020-05-30 DIAGNOSIS — M792 Neuralgia and neuritis, unspecified: Secondary | ICD-10-CM

## 2020-05-30 DIAGNOSIS — R7303 Prediabetes: Secondary | ICD-10-CM

## 2020-05-30 DIAGNOSIS — M79644 Pain in right finger(s): Secondary | ICD-10-CM

## 2020-05-30 LAB — CBC WITH DIFFERENTIAL/PLATELET
Basophils Absolute: 0 10*3/uL (ref 0.0–0.1)
Basophils Relative: 0.3 % (ref 0.0–3.0)
Eosinophils Absolute: 0.1 10*3/uL (ref 0.0–0.7)
Eosinophils Relative: 2.1 % (ref 0.0–5.0)
HCT: 39.4 % (ref 36.0–46.0)
Hemoglobin: 12.8 g/dL (ref 12.0–15.0)
Lymphocytes Relative: 48 % — ABNORMAL HIGH (ref 12.0–46.0)
Lymphs Abs: 2.2 10*3/uL (ref 0.7–4.0)
MCHC: 32.5 g/dL (ref 30.0–36.0)
MCV: 80 fl (ref 78.0–100.0)
Monocytes Absolute: 0.3 10*3/uL (ref 0.1–1.0)
Monocytes Relative: 6.7 % (ref 3.0–12.0)
Neutro Abs: 1.9 10*3/uL (ref 1.4–7.7)
Neutrophils Relative %: 42.9 % — ABNORMAL LOW (ref 43.0–77.0)
Platelets: 217 10*3/uL (ref 150.0–400.0)
RBC: 4.93 Mil/uL (ref 3.87–5.11)
RDW: 14.8 % (ref 11.5–15.5)
WBC: 4.5 10*3/uL (ref 4.0–10.5)

## 2020-05-30 LAB — COMPREHENSIVE METABOLIC PANEL
ALT: 12 U/L (ref 0–35)
AST: 14 U/L (ref 0–37)
Albumin: 4.2 g/dL (ref 3.5–5.2)
Alkaline Phosphatase: 45 U/L (ref 39–117)
BUN: 9 mg/dL (ref 6–23)
CO2: 31 mEq/L (ref 19–32)
Calcium: 9.6 mg/dL (ref 8.4–10.5)
Chloride: 104 mEq/L (ref 96–112)
Creatinine, Ser: 0.84 mg/dL (ref 0.40–1.20)
GFR: 76.85 mL/min (ref >60.00)
Glucose, Bld: 79 mg/dL (ref 70–99)
Potassium: 4 mEq/L (ref 3.5–5.1)
Sodium: 138 mEq/L (ref 135–145)
Total Bilirubin: 0.4 mg/dL (ref 0.2–1.2)
Total Protein: 7.6 g/dL (ref 6.0–8.3)

## 2020-05-30 LAB — HEMOGLOBIN A1C: Hgb A1c MFr Bld: 6.1 % (ref 4.6–6.5)

## 2020-05-30 LAB — SEDIMENTATION RATE: Sed Rate: 17 mm/hr (ref 0–30)

## 2020-05-30 LAB — URIC ACID: Uric Acid, Serum: 5 mg/dL (ref 2.4–7.0)

## 2020-05-30 NOTE — Progress Notes (Signed)
Acute Office Visit  Subjective:    Patient ID: Natalie Vazquez, female    DOB: [birth year], 60 y.o.   MRN: [id number]  Chief Complaint  Patient presents with  . Leg Pain    Left leg X 2 week   . Hand Pain     Right hand  X 1 day     HPI Patient is in today for left leg pain x 2 weeks. Stabbing, sharp pain for about 2-5 minutes. Pain is mostly around lateral left knee and into side of left leg. Now the pain seems to last longer. Now she is having same pain around R base of thumb. Never had pain like this before. She has not tried anything for this pain yet. No known injury. No swelling, redness, shortness of breath, CP, limitations in ROM, or other associated concerns.   Past Medical History:  Diagnosis Date  . Allergic rhinitis due to other allergen   . Allergy    seasonal  . Anemia   . Arthritis   . Asthma    mild, intermittent  . Chicken pox   . Cholelithiasis   . CTS (carpal tunnel syndrome)   . Diabetes mellitus without complication ([place])   . Fibroids   . Fibromyalgia   . Frequent urination   . GERD (gastroesophageal reflux disease)   . Headache(784.0)   . Migraine   . PONV (postoperative nausea and vomiting)    difficulty waking up  . Problems related to high-risk sexual behavior    Unprotected sex  . Symptomatic menopausal or female climacteric states     Past Surgical History:  Procedure Laterality Date  . ABDOMINAL HYSTERECTOMY  2008   partial, ovaries remain  . CARPAL TUNNEL RELEASE Right 2015  . CHOLECYSTECTOMY  05/19/2011   Procedure: LAPAROSCOPIC CHOLECYSTECTOMY;  Surgeon: Zenovia Jarred, MD;  Location: [facility];  Service: General;  Laterality: N/A;  . LASER ABLATION [place] / VULVAR    . PERCUTANEOUS PINNING  01/22/2012   Procedure: PERCUTANEOUS PINNING EXTREMITY;  Surgeon: Marybelle Killings, MD;  Location: [facility];  Service: Orthopedics;  Laterality: Right;  Closed Reduction and Pinning right 5th Metacarpal Fracture  . TUBAL LIGATION    . WISDOM  TOOTH EXTRACTION      Family History  Problem Relation Age of Onset  . Diabetes Mother        Deceased  . Hypertension Mother   . Anemia Mother   . Hyperlipidemia Mother   . Arthritis/Rheumatoid Mother   . Migraines Mother   . CVA Mother   . Hypertension Father        Deceased  . Stroke Father   . Diabetes Father        Legs Amputated  . Heart disease Father   . Arthritis Father   . Cancer Father   . Heart attack Maternal Aunt   . Diabetes Maternal Uncle   . Migraines Maternal Aunt   . Asthma Maternal Aunt   . Asthma Maternal Uncle   . Stomach cancer Sister   . Breast cancer Sister   . Uterine cancer Sister        or Ovarian  . Sleep apnea Brother   . COPD Brother   . Asthma Sister        x2  . Sudden death Brother        SIDS  . Hypertension Brother   . Diabetes Sister   . Asthma Daughter  x2  . Allergies Daughter   . Sickle cell trait Other        All Children  . Atrial fibrillation Daughter   . Anxiety disorder Daughter   . Allergies Son   . Hypertension Sister   . Heart disease Sister   . Asthma Brother     Social History   Socioeconomic History  . Marital status: Single    Spouse name: Not on file  . Number of children: Not on file  . Years of education: Not on file  . Highest education level: Not on file  Occupational History  . Not on file  Tobacco Use  . Smoking status: Never Smoker  . Smokeless tobacco: Never Used  Vaping Use  . Vaping Use: Never used  Substance and Sexual Activity  . Alcohol use: Yes    Comment: rarely   . Drug use: No  . Sexual activity: Never    Birth control/protection: None, Abstinence, Surgical  Other Topics Concern  . Not on file  Social History Narrative  . Not on file   Social Determinants of Health   Financial Resource Strain: Not on file  Food Insecurity: Not on file  Transportation Needs: Not on file  Physical Activity: Not on file  Stress: Not on file  Social Connections: Not on file   Intimate Partner Violence: Not on file    Outpatient Medications Prior to Visit  Medication Sig Dispense Refill  . Bee Pollen 1000 MG TABS Take 1 tablet by mouth.    . Biotin 1000 MCG tablet Take 1,000 mcg by mouth daily. Three times a week    . cetirizine (ZYRTEC ALLERGY) 10 MG tablet Take 1 tablet (10 mg total) by mouth daily. 30 tablet 11  . Cholecalciferol (VITAMIN D-3) 25 MCG (1000 UT) CAPS Take 1 capsule (1,000 Units total) by mouth daily. 30 capsule 5  . CONTRAVE 8-90 MG TB12 Take 2 tablets by mouth 2 (two) times daily.    . cyclobenzaprine (FLEXERIL) 10 MG tablet 1 tablet PO QHS PRN 30 tablet 0  . esomeprazole (NEXIUM) 40 MG capsule TAKE 1 CAPSULE BY MOUTH EVERY MORNING BEFORE BREAKFAST 90 capsule 1  . levalbuterol (XOPENEX HFA) 45 MCG/ACT inhaler INHALE 1 TO 2 PUFFS BY MOUTH EVERY 6 HOURS AS NEEDED FOR WHEEZING 15 g 5  . NON FORMULARY Take 3 tablets by mouth as needed. Swiss Kriss Herbal Laxative Tab    . Olopatadine HCl 0.2 % SOLN Apply 1 drop to eye daily as needed (itchy/watery eyes). 2.5 mL 11  . pantoprazole (PROTONIX) 20 MG tablet Take by mouth.    . SUMAtriptan (IMITREX) 100 MG tablet Take one tablet by mouth onset of headache, May repeat in 2 hours if headache persists or recurs. 10 tablet 0  . Turmeric 500 MG CAPS Take 1 tablet by mouth daily.    . vitamin E 180 MG (400 UNITS) capsule Take 1 capsule by mouth daily.    Marland Kitchen zonisamide (ZONEGRAN) 25 MG capsule Take 1 capsule by mouth daily.  5  . triamcinolone ointment (KENALOG) 0.1 % APPLY A SMALL AMOUNT TO THE AFFECTED AREA EVERY NIGHT AT BEDTIME (Patient not taking: Reported on 05/30/2020)    . fluticasone (CUTIVATE) 0.005 % ointment SMARTSIG:1 Sparingly Topical Twice Daily PRN    . LINZESS 72 MCG capsule Take 72 mcg by mouth every morning.     No facility-administered medications prior to visit.    Allergies  Allergen Reactions  . Iodine Anaphylaxis  .  Shellfish Allergy Anaphylaxis  . Latex Rash    Review of  Systems REFER TO HPI FOR PERTINENT POSITIVES AND NEGATIVES     Objective:    Physical Exam Vitals and nursing note reviewed.  Constitutional:      Appearance: Normal appearance.  Musculoskeletal:     Right hand: Normal. No swelling, deformity, tenderness or bony tenderness. Normal range of motion. Normal strength. Normal sensation. Normal capillary refill.     Left lower leg: Normal. No swelling, tenderness or bony tenderness. No edema.     Comments: N/V INTACT LEFT LEG. HOMAN'S SIGN NEG.  Neurological:     Mental Status: She is alert.     BP 118/66   Pulse 69   Temp 98 F (36.7 C)   Ht 5\' 6"  (1.676 m)   Wt 206 lb (93.4 kg)   SpO2 98%   BMI 33.25 kg/m  Wt Readings from Last 3 Encounters:  05/30/20 206 lb (93.4 kg)  01/10/20 205 lb 12.8 oz (93.4 kg)  07/03/19 233 lb (105.7 kg)     Lab Results  Component Value Date   TSH 0.65 02/10/2019   Lab Results  Component Value Date   WBC 5.2 02/10/2019   HGB 11.9 (L) 02/10/2019   HCT 36.9 02/10/2019   MCV 77.9 (L) 02/10/2019   PLT 229.0 02/10/2019   Lab Results  Component Value Date   NA 139 05/26/2019   K 4.3 05/26/2019   CO2 30 05/26/2019   GLUCOSE 108 (H) 05/26/2019   BUN 6 05/26/2019   CREATININE 0.71 05/26/2019   BILITOT 0.3 05/26/2019   ALKPHOS 60 05/26/2019   AST 12 05/26/2019   ALT 13 05/26/2019   PROT 7.3 05/26/2019   ALBUMIN 4.0 05/26/2019   CALCIUM 9.4 05/26/2019   ANIONGAP 8 11/03/2017   GFR 102.69 05/26/2019   Lab Results  Component Value Date   CHOL 132 05/26/2019   Lab Results  Component Value Date   HDL 57.20 05/26/2019   Lab Results  Component Value Date   LDLCALC 60 05/26/2019   Lab Results  Component Value Date   TRIG 77.0 05/26/2019   Lab Results  Component Value Date   CHOLHDL 2 05/26/2019   Lab Results  Component Value Date   HGBA1C 6.7 (H) 05/26/2019       Assessment & Plan:   Problem List Items Addressed This Visit      Other   Pre-diabetes   Relevant Orders    Comprehensive metabolic panel   Hemoglobin A1c    Other Visit Diagnoses    Neuropathic pain, leg, left    -  Primary   Relevant Orders   CBC with Differential/Platelet   Comprehensive metabolic panel   Hemoglobin A1c   Sedimentation rate   Uric acid   ANA   Thumb pain, right         1. Neuropathic pain, leg, left 2. Thumb pain, right 3. Pre-diabetes Symptoms today seem to be possible neuropathic pain. Will check labs and treat accordingly. Her HA1c was elevated last February at 6.7, so it's possible that this may be a glucose-related issue. No known injury, so I do not see a need to XR today. She may try Aleve twice daily with food or Diclofenac gel as treatment at this time.    This visit occurred during the SARS-CoV-2 public health emergency.  Safety protocols were in place, including screening questions prior to the visit, additional usage of staff PPE, and extensive cleaning of  exam room while observing appropriate contact time as indicated for disinfecting solutions.    Kalen Neidert M Stormy Connon, PA-C

## 2020-05-30 NOTE — Patient Instructions (Signed)
Please go to the lab today and I will call with results. May try Aleve twice daily with food for symptoms in the meantime.

## 2020-05-31 NOTE — Progress Notes (Signed)
   Complete physical exam  Patient: Natalie Vazquez   DOB: 01/31/1997   60 y.o. Female  MRN: [id number]  Subjective:    No chief complaint on file.   Natalie Vazquez is a 60 y.o. female who presents today for a complete physical exam. She reports consuming a {diet types:17450} diet. {types:19826} She generally feels {DESC; WELL/FAIRLY WELL/POORLY:18703}. She reports sleeping {DESC; WELL/FAIRLY WELL/POORLY:18703}. She {does/does not:200015} have additional problems to discuss today.    Most recent fall risk assessment:    10/08/2021   10:42 AM  Fall Risk   Falls in the past year? 0  Number falls in past yr: 0  Injury with Fall? 0  Risk for fall due to : No Fall Risks  Follow up Falls evaluation completed     Most recent depression screenings:    10/08/2021   10:42 AM 08/29/2020   10:46 AM  PHQ 2/9 Scores  PHQ - 2 Score 0 0  PHQ- 9 Score 5     {VISON DENTAL STD PSA (Optional):27386}  {History (Optional):23778}  Patient Care Team: Jessup, Joy, NP as PCP - General (Nurse Practitioner)   Outpatient Medications Prior to Visit  Medication Sig   fluticasone (FLONASE) 50 MCG/ACT nasal spray Place 2 sprays into both nostrils in the morning and at bedtime. After 7 days, reduce to once daily.   norgestimate-ethinyl estradiol (SPRINTEC 28) 0.25-35 MG-MCG tablet Take 1 tablet by mouth daily.   Nystatin POWD Apply liberally to affected area 2 times per day   spironolactone (ALDACTONE) 100 MG tablet Take 1 tablet (100 mg total) by mouth daily.   No facility-administered medications prior to visit.    ROS        Objective:     There were no vitals taken for this visit. {Vitals History (Optional):23777}  Physical Exam   No results found for any visits on 11/13/21. {Show previous labs (optional):23779}    Assessment & Plan:    Routine Health Maintenance and Physical Exam  Immunization History  Administered Date(s) Administered   DTaP 04/16/1999, 06/12/1999,  08/21/1999, 05/06/2000, 11/20/2003   Hepatitis A 09/16/2007, 09/21/2008   Hepatitis B 02/01/1999, 03/11/1999, 08/21/1999   HiB (PRP-OMP) 04/16/1999, 06/12/1999, 08/21/1999, 05/06/2000   IPV 04/16/1999, 06/12/1999, 02/09/2000, 11/20/2003   Influenza,inj,Quad PF,6+ Mos 12/22/2013   Influenza-Unspecified 03/23/2012   MMR 02/08/2001, 11/20/2003   Meningococcal Polysaccharide 09/21/2011   Pneumococcal Conjugate-13 05/06/2000   Pneumococcal-Unspecified 08/21/1999, 11/04/1999   Tdap 09/21/2011   Varicella 02/09/2000, 09/16/2007    Health Maintenance  Topic Date Due   HIV Screening  Never done   Hepatitis C Screening  Never done   INFLUENZA VACCINE  11/11/2021   PAP-Cervical Cytology Screening  11/13/2021 (Originally 01/31/2020)   PAP SMEAR-Modifier  11/13/2021 (Originally 01/31/2020)   TETANUS/TDAP  11/13/2021 (Originally 09/20/2021)   HPV VACCINES  Discontinued   COVID-19 Vaccine  Discontinued    Discussed health benefits of physical activity, and encouraged her to engage in regular exercise appropriate for her age and condition.  Problem List Items Addressed This Visit   None Visit Diagnoses     Annual physical exam    -  Primary   Cervical cancer screening       Need for Tdap vaccination          No follow-ups on file.     Joy Jessup, NP   

## 2020-06-01 LAB — ANA: Anti Nuclear Antibody (ANA): NEGATIVE

## 2020-06-06 ENCOUNTER — Other Ambulatory Visit: Payer: Self-pay

## 2020-06-06 DIAGNOSIS — M792 Neuralgia and neuritis, unspecified: Secondary | ICD-10-CM

## 2020-06-10 ENCOUNTER — Other Ambulatory Visit: Payer: Self-pay

## 2020-06-10 ENCOUNTER — Encounter: Payer: Self-pay | Admitting: Physician Assistant

## 2020-06-10 ENCOUNTER — Ambulatory Visit: Payer: Federal, State, Local not specified - PPO | Admitting: Physician Assistant

## 2020-06-10 VITALS — BP 125/81 | HR 77 | Temp 97.8°F | Ht 66.0 in | Wt 204.0 lb

## 2020-06-10 DIAGNOSIS — J302 Other seasonal allergic rhinitis: Secondary | ICD-10-CM | POA: Diagnosis not present

## 2020-06-10 DIAGNOSIS — Z6832 Body mass index (BMI) 32.0-32.9, adult: Secondary | ICD-10-CM

## 2020-06-10 DIAGNOSIS — J4520 Mild intermittent asthma, uncomplicated: Secondary | ICD-10-CM | POA: Diagnosis not present

## 2020-06-10 DIAGNOSIS — E669 Obesity, unspecified: Secondary | ICD-10-CM | POA: Diagnosis not present

## 2020-06-10 DIAGNOSIS — G43709 Chronic migraine without aura, not intractable, without status migrainosus: Secondary | ICD-10-CM

## 2020-06-10 DIAGNOSIS — K219 Gastro-esophageal reflux disease without esophagitis: Secondary | ICD-10-CM | POA: Diagnosis not present

## 2020-06-10 NOTE — Progress Notes (Signed)
Established Patient Office Visit  Subjective:  Patient ID: Natalie Vazquez, female    DOB: 03/09/1961  Age: 60 y.o. MRN: [id number]  CC:  Chief Complaint  Patient presents with  . Transitions Of Care    HPI Damonie R Mol presents for transition of care from [place], [place]. 5 children and 14 grandchildren.  She has no acute concerns today. We reviewed her PMH and medications together:   Seasonal allergies - takes bee pollen tablets and occasionally Zyrtec 10 mg.   Exercise induced asthma - Xopenex HFA as needed, usually worse in pollen season.  Obesity - Taking Contrave through [facility] with Everardo Pacific, NP, through weight management. Helps to regulate appetite and she is liking the medication so far. No bad side effects. She walks a lot at work [employer]). Started at 236 lbs and has really noticed a difference in her pain being better (especially knees and back).  GERD - better since weight loss. Protonix 20 mg only as needed.   Migraines - have improved with Zonisamide. Imitrex prn. [facility] 6 [facility].    Past Medical History:  Diagnosis Date  . Allergic rhinitis due to other allergen   . Allergy    seasonal  . Anemia   . Arthritis   . Asthma    mild, intermittent  . Chicken pox   . Cholelithiasis   . CTS (carpal tunnel syndrome)   . Diabetes mellitus without complication ([place])   . Fibroids   . Fibromyalgia   . Frequent urination   . GERD (gastroesophageal reflux disease)   . Headache(784.0)   . Migraine   . PONV (postoperative nausea and vomiting)    difficulty waking up  . Problems related to high-risk sexual behavior    Unprotected sex  . Symptomatic menopausal or female climacteric states     Past Surgical History:  Procedure Laterality Date  . ABDOMINAL HYSTERECTOMY  2008   partial, ovaries remain  . CARPAL TUNNEL RELEASE Right 2015  . CHOLECYSTECTOMY  05/19/2011   Procedure: LAPAROSCOPIC CHOLECYSTECTOMY;   Surgeon: Zenovia Jarred, MD;  Location: [facility];  Service: General;  Laterality: N/A;  . LASER ABLATION [place] / VULVAR    . PERCUTANEOUS PINNING  01/22/2012   Procedure: PERCUTANEOUS PINNING EXTREMITY;  Surgeon: Marybelle Killings, MD;  Location: [facility];  Service: Orthopedics;  Laterality: Right;  Closed Reduction and Pinning right 5th Metacarpal Fracture  . TUBAL LIGATION    . WISDOM TOOTH EXTRACTION      Family History  Problem Relation Age of Onset  . Diabetes Mother        Deceased  . Hypertension Mother   . Anemia Mother   . Hyperlipidemia Mother   . Arthritis/Rheumatoid Mother   . Migraines Mother   . CVA Mother   . Hypertension Father        Deceased  . Stroke Father   . Diabetes Father        Legs Amputated  . Heart disease Father   . Arthritis Father   . Cancer Father   . Heart attack Maternal Aunt   . Diabetes Maternal Uncle   . Migraines Maternal Aunt   . Asthma Maternal Aunt   . Asthma Maternal Uncle   . Stomach cancer Sister   . Breast cancer Sister   . Uterine cancer Sister        or Ovarian  . Sleep apnea Brother   . COPD Brother   .  Asthma Sister        x2  . Sudden death Brother        SIDS  . Hypertension Brother   . Diabetes Sister   . Asthma Daughter        x2  . Allergies Daughter   . Sickle cell trait Other        All Children  . Atrial fibrillation Daughter   . Anxiety disorder Daughter   . Allergies Son   . Hypertension Sister   . Heart disease Sister   . Asthma Brother     Social History   Socioeconomic History  . Marital status: Single    Spouse name: Not on file  . Number of children: Not on file  . Years of education: Not on file  . Highest education level: Not on file  Occupational History  . Not on file  Tobacco Use  . Smoking status: Never Smoker  . Smokeless tobacco: Never Used  Vaping Use  . Vaping Use: Never used  Substance and Sexual Activity  . Alcohol use: Yes    Comment: rarely   . Drug use: No  .  Sexual activity: Never    Birth control/protection: None, Abstinence, Surgical  Other Topics Concern  . Not on file  Social History Narrative  . Not on file   Social Determinants of Health   Financial Resource Strain: Not on file  Food Insecurity: Not on file  Transportation Needs: Not on file  Physical Activity: Not on file  Stress: Not on file  Social Connections: Not on file  Intimate Partner Violence: Not on file    Outpatient Medications Prior to Visit  Medication Sig Dispense Refill  . Bee Pollen 1000 MG TABS Take 1 tablet by mouth.    . Biotin 1000 MCG tablet Take 1,000 mcg by mouth daily. Three times a week    . cetirizine (ZYRTEC ALLERGY) 10 MG tablet Take 1 tablet (10 mg total) by mouth daily. 30 tablet 11  . Cholecalciferol (VITAMIN D-3) 25 MCG (1000 UT) CAPS Take 1 capsule (1,000 Units total) by mouth daily. 30 capsule 5  . CONTRAVE 8-90 MG TB12 Take 2 tablets by mouth 2 (two) times daily.    . cyclobenzaprine (FLEXERIL) 10 MG tablet 1 tablet PO QHS PRN 30 tablet 0  . esomeprazole (NEXIUM) 40 MG capsule TAKE 1 CAPSULE BY MOUTH EVERY MORNING BEFORE BREAKFAST 90 capsule 1  . levalbuterol (XOPENEX HFA) 45 MCG/ACT inhaler INHALE 1 TO 2 PUFFS BY MOUTH EVERY 6 HOURS AS NEEDED FOR WHEEZING 15 g 5  . NON FORMULARY Take 3 tablets by mouth as needed. Swiss Kriss Herbal Laxative Tab    . Olopatadine HCl 0.2 % SOLN Apply 1 drop to eye daily as needed (itchy/watery eyes). 2.5 mL 11  . pantoprazole (PROTONIX) 20 MG tablet Take by mouth.    . SUMAtriptan (IMITREX) 100 MG tablet Take one tablet by mouth onset of headache, May repeat in 2 hours if headache persists or recurs. 10 tablet 0  . triamcinolone ointment (KENALOG) 0.1 %     . Turmeric 500 MG CAPS Take 1 tablet by mouth daily.    . vitamin E 180 MG (400 UNITS) capsule Take 1 capsule by mouth daily.    Marland Kitchen zonisamide (ZONEGRAN) 25 MG capsule Take 1 capsule by mouth daily.  5   No facility-administered medications prior to visit.     Allergies  Allergen Reactions  . Iodine Anaphylaxis  . Shellfish Allergy  Anaphylaxis  . Latex Rash    ROS Review of Systems  Constitutional: Negative for activity change, appetite change, fever and unexpected weight change.  HENT: Negative for congestion.   Eyes: Negative for visual disturbance.  Respiratory: Negative for apnea, cough and shortness of breath.   Cardiovascular: Negative for chest pain, palpitations and leg swelling.  Gastrointestinal: Negative for abdominal pain, blood in stool, constipation and diarrhea.  Endocrine: Negative for polydipsia, polyphagia and polyuria.  Genitourinary: Negative for dysuria and pelvic pain.  Musculoskeletal: Negative for arthralgias.  Skin: Negative for rash.  Neurological: Negative for dizziness, weakness and headaches.  Hematological: Negative for adenopathy. Does not bruise/bleed easily.  Psychiatric/Behavioral: Negative for sleep disturbance and suicidal ideas. The patient is not nervous/anxious.       Objective:    Physical Exam Constitutional:      General: She is not in acute distress.    Appearance: Normal appearance. She is obese.  HENT:     Head: Normocephalic and atraumatic.     Right Ear: External ear normal.     Left Ear: External ear normal.     Nose: Nose normal.     Mouth/Throat:     Mouth: Mucous membranes are moist.  Eyes:     Extraocular Movements: Extraocular movements intact.     Pupils: Pupils are equal, round, and reactive to light.  Cardiovascular:     Rate and Rhythm: Normal rate and regular rhythm.     Pulses: Normal pulses.  Pulmonary:     Effort: Pulmonary effort is normal.     Breath sounds: Normal breath sounds.  Lymphadenopathy:     Cervical: No cervical adenopathy.  Skin:    General: Skin is warm and dry.  Neurological:     General: No focal deficit present.     Mental Status: She is alert and oriented to person, place, and time.  Psychiatric:        Mood and Affect: Mood normal.         Behavior: Behavior normal.     BP 125/81   Pulse 77   Temp 97.8 F (36.6 C)   Ht 5\' 6"  (1.676 m)   Wt 204 lb (92.5 kg)   SpO2 95%   BMI 32.93 kg/m  Wt Readings from Last 3 Encounters:  06/10/20 204 lb (92.5 kg)  05/30/20 206 lb (93.4 kg)  01/10/20 205 lb 12.8 oz (93.4 kg)      Lab Results  Component Value Date   TSH 0.65 02/10/2019   Lab Results  Component Value Date   WBC 4.5 05/30/2020   HGB 12.8 05/30/2020   HCT 39.4 05/30/2020   MCV 80.0 05/30/2020   PLT 217.0 05/30/2020   Lab Results  Component Value Date   NA 138 05/30/2020   K 4.0 05/30/2020   CO2 31 05/30/2020   GLUCOSE 79 05/30/2020   BUN 9 05/30/2020   CREATININE 0.84 05/30/2020   BILITOT 0.4 05/30/2020   ALKPHOS 45 05/30/2020   AST 14 05/30/2020   ALT 12 05/30/2020   PROT 7.6 05/30/2020   ALBUMIN 4.2 05/30/2020   CALCIUM 9.6 05/30/2020   ANIONGAP 8 11/03/2017   GFR 76.85 05/30/2020   Lab Results  Component Value Date   CHOL 132 05/26/2019   Lab Results  Component Value Date   HDL 57.20 05/26/2019   Lab Results  Component Value Date   LDLCALC 60 05/26/2019   Lab Results  Component Value Date   TRIG 77.0 05/26/2019  Lab Results  Component Value Date   CHOLHDL 2 05/26/2019   Lab Results  Component Value Date   HGBA1C 6.1 05/30/2020      Assessment & Plan:   Problem List Items Addressed This Visit      Cardiovascular and Mediastinum   Migraines     Respiratory   Mild intermittent asthma without complication     Digestive   GERD (gastroesophageal reflux disease)     Other   Seasonal allergies   Obesity (BMI 30-39.9) - Primary       Follow-up: Return in about 6 months (around 12/08/2020) for female wellness exam .    1. Obesity (BMI 30-39.9) She is doing well with her weight loss journey.  She will continue to follow-up with her specialist about this.  I did encourage her to increase her aerobic activity.  For now she is going to stay on the  Contrave.  2. Mild intermittent asthma without complication This is very seasonal.  She has her inhaler to use as needed and does not need refills today.  3. Gastroesophageal reflux disease without esophagitis Much better since losing weight.  Takes Protonix 20 mg only as needed now.  4. Seasonal allergies Takes bee pollen and Zyrtec only as needed.  She had no concerns about this today.  5. Chronic migraine without aura without status migrainosus, not intractable [facility].  Her migraines have been much improved.  She will continue regular follow-up with them.  She is taking zonisamide daily.  Time spent with patient reviewing history and medications and plan listed above: 32 minutes.   This visit occurred during the SARS-CoV-2 public health emergency.  Safety protocols were in place, including screening questions prior to the visit, additional usage of staff PPE, and extensive cleaning of exam room while observing appropriate contact time as indicated for disinfecting solutions.    Iowa Kappes M Cher Franzoni, PA-C

## 2020-06-10 NOTE — Patient Instructions (Signed)
Good to see you again!  Keep up the good work with your weight loss journey! I will see you in 6 months approximately for a female wellness exam. Call sooner if any concerns.     The Journal of Orthopaedic and Sports Physical Therapy, 44(10), 748. [email or web address]">  How to Increase Your Level of Physical Activity Getting regular physical activity is important for your overall health and well-being. Most people do not get enough exercise. There are easy ways to increase your level of physical activity, even if you have not been very active in the past or if you are just starting out. How can increasing my physical activity affect me? Physical activity has many short-term and long-term benefits. Being active on a regular basis can improve your physical and mental health as well as provide other benefits. Physical health benefits  Helping you lose weight or maintain a healthy weight.  Strengthening your muscles and bones.  Reducing your risk of certain long-term (chronic) diseases, including heart disease, cancer, and diabetes.  Being able to move around more easily and for longer periods of time without getting tired (increased stamina).  Improving your ability to fight off illness (enhanced immunity).  Being able to sleep better.  Helping you stay healthy as you get older, including: ? Helping you stay mobile, or capable of walking and moving around. ? Preventing accidents, such as falls. ? Increasing life expectancy. Mental health benefits  Boosting your mood and improving your self-esteem.  Lowering your chance of having mental health problems, such as depression or anxiety.  Helping you feel good about your body. Other benefits  Finding new sources of fun and enjoyment.  Meeting new people who share a common interest. What steps can I take to be more physically active? Getting started  If you have a chronic illness or have not been active for a  while, check with your health care provider about how to get started. Ask your health care provider what activities are safe for you.  Start out slowly. Walking or doing some simple chair exercises is a good place to start, especially if you have not been active before or for a long time.  Set goals that you can work toward. Ask your health care provider how much exercise is best for you. In general, most adults should: ? Do moderate-intensity exercise for at least 150 minutes each week (30 minutes on most days of the week) or vigorous exercise for at least 75 minutes each week, or a combination of these.  Moderate-intensity exercise can include walking at a quick pace, biking, yoga, water aerobics, or gardening.  Vigorous exercise involves activities that take more effort, such as jogging or running, playing sports, swimming laps, or jumping rope. ? Do strength exercises on at least 2 days each week. This can include weight lifting, body weight exercises, and resistance-band exercises.  Consider using a fitness tracker, such as a mobile phone app or a device worn like a watch, that will count the number of steps you take each day. Many people strive to reach 10,000 steps a day. Choosing activities  Try to find activities that you enjoy. You are more likely to commit to an exercise routine if it does not feel like a chore.  If you have bone or joint problems, choose low-impact exercises, like walking or swimming.  Use these tips for being successful with an exercise plan: ? Find a workout partner for accountability. ? Join a group or class, such as  an aerobics class, cycling class, or sports team. ? Make family time active. Go for a walk, bike, or swim. ? Include a variety of exercises each week. Being active in your daily routines Besides your formal exercise plans, you can find ways to do physical activity during your daily routines, such as:  Walking or biking to work or to the  store.  Taking the stairs instead of the elevator.  Parking farther away from the door at work or at the store.  Planning walking meetings.  Walking around while you are on the phone.   Where to find more information  Centers for Disease Control and Prevention: WorkDashboard.es  President's Council on Fitness, Sports & Nutrition: www.fitness.gov  ChooseMyPlate: FormerBoss.no Contact a health care provider if:  You have headaches, muscle aches, or joint pain.  You feel dizzy or light-headed while exercising.  You faint.  You have chest pain while exercising. Summary  Exercise benefits your mind and body at any age, even if you are just starting out.  If you have a chronic illness or have not been active for a while, check with your health care provider before increasing your physical activity.  Choose activities that are safe and enjoyable for you. Ask your health care provider what activities are safe for you.  Start slowly. Tell your health care provider if you have problems as you start to increase your activity level. This information is not intended to replace advice given to you by your health care provider. Make sure you discuss any questions you have with your health care provider. Document Revised: 01/02/2019 Document Reviewed: 10/24/2018 Elsevier Patient Education  [place].

## 2020-07-01 ENCOUNTER — Encounter: Payer: Federal, State, Local not specified - PPO | Admitting: Physician Assistant

## 2020-07-16 ENCOUNTER — Encounter: Payer: Self-pay | Admitting: Physician Assistant

## 2020-07-18 ENCOUNTER — Encounter: Payer: Self-pay | Admitting: Physician Assistant

## 2020-07-18 ENCOUNTER — Other Ambulatory Visit: Payer: Self-pay

## 2020-07-18 ENCOUNTER — Ambulatory Visit: Payer: Federal, State, Local not specified - PPO | Admitting: Physician Assistant

## 2020-07-18 VITALS — BP 134/76 | HR 64 | Temp 97.4°F | Ht 66.0 in | Wt 206.5 lb

## 2020-07-18 DIAGNOSIS — M1712 Unilateral primary osteoarthritis, left knee: Secondary | ICD-10-CM | POA: Diagnosis not present

## 2020-07-18 DIAGNOSIS — S76011A Strain of muscle, fascia and tendon of right hip, initial encounter: Secondary | ICD-10-CM

## 2020-07-18 NOTE — Progress Notes (Signed)
Acute Office Visit  Subjective:    Patient ID: Natalie Vazquez, female    DOB: [birth year], 60 y.o.   MRN: [id number]  Chief Complaint  Patient presents with  . Muscle Pain    HPI Patient is in today for R buttock pain x 1 month. She was racing her grandson when she felt a pull in her R buttock muscle; she still won. Says it is hard to bend down or put pants on, change in position worsens the pain. She has been taking Tylenol 1000 mg once daily because Aleve and "those types of meds don't work for me." She also complains of ongoing left knee pain. XR from 07-03-19 shows mild degenerative changes. Pt states between her left knee and her R buttock pain, she is having a hard time getting around for the last month.    Past Medical History:  Diagnosis Date  . Allergic rhinitis due to other allergen   . Allergy    seasonal  . Anemia   . Arthritis   . Asthma    mild, intermittent  . Chicken pox   . Cholelithiasis   . CTS (carpal tunnel syndrome)   . Diabetes mellitus without complication ([place])   . Fibroids   . Fibromyalgia   . Frequent urination   . GERD (gastroesophageal reflux disease)   . Headache(784.0)   . Migraine   . PONV (postoperative nausea and vomiting)    difficulty waking up  . Problems related to high-risk sexual behavior    Unprotected sex  . Symptomatic menopausal or female climacteric states     Past Surgical History:  Procedure Laterality Date  . ABDOMINAL HYSTERECTOMY  2008   partial, ovaries remain  . CARPAL TUNNEL RELEASE Right 2015  . CHOLECYSTECTOMY  05/19/2011   Procedure: LAPAROSCOPIC CHOLECYSTECTOMY;  Surgeon: Zenovia Jarred, MD;  Location: [facility];  Service: General;  Laterality: N/A;  . LASER ABLATION [place] / VULVAR    . PERCUTANEOUS PINNING  01/22/2012   Procedure: PERCUTANEOUS PINNING EXTREMITY;  Surgeon: Marybelle Killings, MD;  Location: [facility];  Service: Orthopedics;  Laterality: Right;  Closed Reduction and Pinning right 5th Metacarpal  Fracture  . TUBAL LIGATION    . WISDOM TOOTH EXTRACTION      Family History  Problem Relation Age of Onset  . Diabetes Mother        Deceased  . Hypertension Mother   . Anemia Mother   . Hyperlipidemia Mother   . Arthritis/Rheumatoid Mother   . Migraines Mother   . CVA Mother   . Hypertension Father        Deceased  . Stroke Father   . Diabetes Father        Legs Amputated  . Heart disease Father   . Arthritis Father   . Cancer Father   . Heart attack Maternal Aunt   . Diabetes Maternal Uncle   . Migraines Maternal Aunt   . Asthma Maternal Aunt   . Asthma Maternal Uncle   . Stomach cancer Sister   . Breast cancer Sister   . Uterine cancer Sister        or Ovarian  . Sleep apnea Brother   . COPD Brother   . Asthma Sister        x2  . Sudden death Brother        SIDS  . Hypertension Brother   . Diabetes Sister   . Asthma Daughter  x2  . Allergies Daughter   . Sickle cell trait Other        All Children  . Atrial fibrillation Daughter   . Anxiety disorder Daughter   . Allergies Son   . Hypertension Sister   . Heart disease Sister   . Asthma Brother     Social History   Socioeconomic History  . Marital status: Single    Spouse name: Not on file  . Number of children: Not on file  . Years of education: Not on file  . Highest education level: Not on file  Occupational History  . Not on file  Tobacco Use  . Smoking status: Never Smoker  . Smokeless tobacco: Never Used  Vaping Use  . Vaping Use: Never used  Substance and Sexual Activity  . Alcohol use: Yes    Comment: rarely   . Drug use: No  . Sexual activity: Never    Birth control/protection: None, Abstinence, Surgical  Other Topics Concern  . Not on file  Social History Narrative  . Not on file   Social Determinants of Health   Financial Resource Strain: Not on file  Food Insecurity: Not on file  Transportation Needs: Not on file  Physical Activity: Not on file  Stress: Not on  file  Social Connections: Not on file  Intimate Partner Violence: Not on file    Outpatient Medications Prior to Visit  Medication Sig Dispense Refill  . Bee Pollen 1000 MG TABS Take 1 tablet by mouth.    . Biotin 1000 MCG tablet Take 1,000 mcg by mouth daily. Three times a week    . cetirizine (ZYRTEC ALLERGY) 10 MG tablet Take 1 tablet (10 mg total) by mouth daily. 30 tablet 11  . Cholecalciferol (VITAMIN D-3) 25 MCG (1000 UT) CAPS Take 1 capsule (1,000 Units total) by mouth daily. 30 capsule 5  . CONTRAVE 8-90 MG TB12 Take 2 tablets by mouth 2 (two) times daily.    . cyclobenzaprine (FLEXERIL) 10 MG tablet 1 tablet PO QHS PRN 30 tablet 0  . esomeprazole (NEXIUM) 40 MG capsule TAKE 1 CAPSULE BY MOUTH EVERY MORNING BEFORE BREAKFAST 90 capsule 1  . levalbuterol (XOPENEX HFA) 45 MCG/ACT inhaler INHALE 1 TO 2 PUFFS BY MOUTH EVERY 6 HOURS AS NEEDED FOR WHEEZING 15 g 5  . NON FORMULARY Take 3 tablets by mouth as needed. Swiss Kriss Herbal Laxative Tab    . Olopatadine HCl 0.2 % SOLN Apply 1 drop to eye daily as needed (itchy/watery eyes). 2.5 mL 11  . pantoprazole (PROTONIX) 20 MG tablet Take by mouth.    . SUMAtriptan (IMITREX) 100 MG tablet Take one tablet by mouth onset of headache, May repeat in 2 hours if headache persists or recurs. 10 tablet 0  . triamcinolone ointment (KENALOG) 0.1 %     . Turmeric 500 MG CAPS Take 1 tablet by mouth daily.    . vitamin E 180 MG (400 UNITS) capsule Take 1 capsule by mouth daily.    Marland Kitchen zonisamide (ZONEGRAN) 25 MG capsule Take 1 capsule by mouth daily.  5   No facility-administered medications prior to visit.    Allergies  Allergen Reactions  . Iodine Anaphylaxis  . Shellfish Allergy Anaphylaxis  . Latex Rash    Review of Systems REFER TO HPI FOR PERTINENT POSITIVES AND NEGATIVES     Objective:    Physical Exam Vitals and nursing note reviewed.  Constitutional:      Appearance: She is obese.  Musculoskeletal:     Left knee: No swelling,  deformity, effusion or erythema. Normal range of motion (complains of mild pain with flexion).     Left lower leg: Normal. No swelling. No edema.       Legs:  Neurological:     Mental Status: She is alert.     BP 134/76   Pulse 64   Temp (!) 97.4 F (36.3 C)   Ht 5\' 6"  (1.676 m)   Wt 206 lb 8 oz (93.7 kg)   SpO2 97%   BMI 33.33 kg/m  Wt Readings from Last 3 Encounters:  07/18/20 206 lb 8 oz (93.7 kg)  06/10/20 204 lb (92.5 kg)  05/30/20 206 lb (93.4 kg)    Health Maintenance Due  Topic Date Due  . MAMMOGRAM  07/03/2020    There are no preventive care reminders to display for this patient.   Lab Results  Component Value Date   TSH 0.65 02/10/2019   Lab Results  Component Value Date   WBC 4.5 05/30/2020   HGB 12.8 05/30/2020   HCT 39.4 05/30/2020   MCV 80.0 05/30/2020   PLT 217.0 05/30/2020   Lab Results  Component Value Date   NA 138 05/30/2020   K 4.0 05/30/2020   CO2 31 05/30/2020   GLUCOSE 79 05/30/2020   BUN 9 05/30/2020   CREATININE 0.84 05/30/2020   BILITOT 0.4 05/30/2020   ALKPHOS 45 05/30/2020   AST 14 05/30/2020   ALT 12 05/30/2020   PROT 7.6 05/30/2020   ALBUMIN 4.2 05/30/2020   CALCIUM 9.6 05/30/2020   ANIONGAP 8 11/03/2017   GFR 76.85 05/30/2020   Lab Results  Component Value Date   CHOL 132 05/26/2019   Lab Results  Component Value Date   HDL 57.20 05/26/2019   Lab Results  Component Value Date   LDLCALC 60 05/26/2019   Lab Results  Component Value Date   TRIG 77.0 05/26/2019   Lab Results  Component Value Date   CHOLHDL 2 05/26/2019   Lab Results  Component Value Date   HGBA1C 6.1 05/30/2020       Assessment & Plan:   Problem List Items Addressed This Visit   None     1. Muscle strain of right gluteal region, initial encounter I think this is a strain of the R gluteus maximus x 1 month and she will benefit from PT at this time. Referral sent today. She may ice area and try to gently stretch at home.  2.  Primary osteoarthritis of left knee Ongoing OA pain left knee. Increase Tylenol to 3000 mg total daily. Keep active. Consider swimming exercises. Will have her discuss with PT about options as well. She may need to consider a steroid injection with ortho.   Dillen Belmontes M Orestes Geiman, PA-C

## 2020-07-18 NOTE — Patient Instructions (Addendum)
I think you strained your right gluteal muscle over a month ago. I am going to refer you to see our PT in office. You may ice area three times daily for 10-15 minutes.  You also have osteoarthritis of the left knee. You may increase Tylenol to 3000 mg daily (1000 mg at breakfast, then lunch, then again at dinner).  Please keep me updated on your progress!

## 2020-07-23 ENCOUNTER — Ambulatory Visit: Payer: Federal, State, Local not specified - PPO | Admitting: Physical Therapy

## 2020-07-24 ENCOUNTER — Other Ambulatory Visit: Payer: Self-pay

## 2020-07-24 ENCOUNTER — Encounter: Payer: Self-pay | Admitting: Physical Therapy

## 2020-07-24 ENCOUNTER — Ambulatory Visit: Payer: Federal, State, Local not specified - PPO | Admitting: Physical Therapy

## 2020-07-24 DIAGNOSIS — M62838 Other muscle spasm: Secondary | ICD-10-CM | POA: Diagnosis not present

## 2020-07-24 DIAGNOSIS — M79604 Pain in right leg: Secondary | ICD-10-CM | POA: Diagnosis not present

## 2020-07-24 NOTE — Therapy (Signed)
[name] [address] [place], [place], [address] Phone: [phone]   Fax:  [phone]  Physical Therapy Evaluation  Patient Details  Name: Natalie Vazquez MRN: [id number] Date of Birth: 06/09/1960 Referring Provider (PT): Alyssa Allwardt   Encounter Date: 07/24/2020   PT End of Session - 07/24/20 1207    Visit Number 1    Authorization Type [employer] Blue [employer]    PT Start Time [phone]    PT Stop Time 1002    PT Time Calculation (min) 35 min    Activity Tolerance Patient tolerated treatment well    Behavior During Therapy [facility] for tasks assessed/performed           Past Medical History:  Diagnosis Date  . Allergic rhinitis due to other allergen   . Allergy    seasonal  . Anemia   . Arthritis   . Asthma    mild, intermittent  . Chicken pox   . Cholelithiasis   . CTS (carpal tunnel syndrome)   . Diabetes mellitus without complication ([place])   . Fibroids   . Fibromyalgia   . Frequent urination   . GERD (gastroesophageal reflux disease)   . Headache(784.0)   . Migraine   . PONV (postoperative nausea and vomiting)    difficulty waking up  . Problems related to high-risk sexual behavior    Unprotected sex  . Symptomatic menopausal or female climacteric states     Past Surgical History:  Procedure Laterality Date  . ABDOMINAL HYSTERECTOMY  2008   partial, ovaries remain  . CARPAL TUNNEL RELEASE Right 2015  . CHOLECYSTECTOMY  05/19/2011   Procedure: LAPAROSCOPIC CHOLECYSTECTOMY;  Surgeon: Zenovia Jarred, MD;  Location: [facility];  Service: General;  Laterality: N/A;  . LASER ABLATION [place] / VULVAR    . PERCUTANEOUS PINNING  01/22/2012   Procedure: PERCUTANEOUS PINNING EXTREMITY;  Surgeon: Marybelle Killings, MD;  Location: [facility];  Service: Orthopedics;  Laterality: Right;  Closed Reduction and Pinning right 5th Metacarpal Fracture  . TUBAL LIGATION    . WISDOM TOOTH EXTRACTION      There were no vitals filed  for this visit.    Subjective Assessment - 07/24/20 0926    Subjective Pt reports pain in R gluteal region that began one month ago while running with her grandson. Pain subsided for a few weeks but returned after she fell out of a chair and landed on her R gluteal region. Pt describes pain as a shooting sensation that travels from her R glutes to her pelvic floor region. Pts pain is aggravated after sitting for greater than 10 minutes. Pt works at the [employer] and her work requires stair climbing and periods of sitting which aggravates her pain. Pt enjoys walking for exercise and takes 10,000-18,000 steps per day    Limitations Sitting;Standing;Walking;House hold activities    Patient Stated Goals decrease pain    Currently in Pain? Yes    Pain Score 5    pt rated pain as 10/10 at worst after waking or sitting for long periods of time   Pain Location Buttocks    Pain Orientation Right    Pain Descriptors / Indicators Aching;Shooting    Pain Type Acute pain    Pain Radiating Towards Pain travels from R gluteal region to pelvic floor region    Pain Onset More than a month ago    Pain Frequency Intermittent    Aggravating Factors  sitting, walking, climbing  stairs    Pain Relieving Factors ibuprofen              [facility] PT Assessment - 07/24/20 0001      Assessment   Medical Diagnosis R glute pain    Referring Provider (PT) Alyssa Allwardt    Prior Therapy no      Precautions   Precautions None      Balance Screen   Has the patient fallen in the past 6 months No      Prior Function   Level of Independence Independent      Cognition   Overall Cognitive Status Within Functional Limits for tasks assessed      ROM / Strength   AROM / PROM / Strength AROM;Strength      AROM   AROM Assessment Site Hip    Right/Left Hip Right;Left    Right Hip Flexion 100    Right Hip ABduction 70    Right Hip ADduction 20    Left Hip External Rotation  --   mild limitation for ER   Left  Hip Internal Rotation  --   WNL     Strength   Strength Assessment Site Hip    Right/Left Hip Left;Right    Right Hip Flexion 4/5    Left Hip Flexion 4/5    Left Hip External Rotation 4/5    Left Hip Internal Rotation 4/5      Palpation   Palpation comment no pain in back, glute musculature, adductors, or hamstring; increased pain at R ischial tuberosity and into pelvic floor, suspect obturater internus      Special Tests   Other special tests no radicular pain, numbness/tingling,                      Objective measurements completed on examination: See above findings.       [facility] Adult PT Treatment/Exercise - 07/24/20 0001      Exercises   Exercises Knee/Hip      Knee/Hip Exercises: Stretches   Other Knee/Hip Stretches SKTC 30 sec x 3 on R;    Other Knee/Hip Stretches Standing hip adductor stretch 10 sec x 5 on R;                  PT Education - 07/24/20 1113    Education Details Exam findings, Discussed referral to pelvic floor PT for further assessment.    Person(s) Educated Patient    Methods Explanation    Comprehension Verbalized understanding            PT Short Term Goals - 07/24/20 1316      PT SHORT TERM GOAL #1   Title Goals to be revisted after pt referral to pelvic floor physical therapy                     Plan - 07/24/20 1208    Clinical Impression Statement Pt is a 60 year old female with pain in R gluteal region than radiates towards her pelvic floor and reduced LE strength due to pain. Pt does not appear to have any pain in surrounding areas of low back, hip, glute or hamstring. Suspect obturater internus muscle dysfunction, but will  benefit from pelvic floor PT  to further asess.  Plan to refer pt to pelvic floor physical therapy for further evaluation. Discussed use of donut cushion for short term use to decrease muscle pain with sitting at work.  Examination-Activity Limitations Locomotion  Level;Sit;Squat;Stairs;Stand;Transfers    Examination-Participation Restrictions Community Activity;Occupation    Stability/Clinical Decision Making Stable/Uncomplicated    Clinical Decision Making Low    Rehab Potential Good    PT Frequency 2x / week    PT Duration 6 weeks    PT Treatment/Interventions ADLs/Self Care Home Management;Iontophoresis 4mg /ml Dexamethasone;Moist Heat;Traction;Ultrasound;Cryotherapy;Electrical Stimulation;Gait training;Stair training;Functional mobility training;Therapeutic activities;Therapeutic exercise;Balance training;Neuromuscular re-education;Manual techniques;Passive range of motion;Dry needling;Taping;Spinal Manipulations;Joint Manipulations    Recommended Other Services Plan to refer pt to pelvic floor physical therapy    Consulted and Agree with Plan of Care Patient           Patient will benefit from skilled therapeutic intervention in order to improve the following deficits and impairments:  Pain,Decreased mobility,Increased muscle spasms,Decreased activity tolerance,Decreased strength  Visit Diagnosis: Pain of right lower extremity  Other muscle spasm     Problem List Patient Active Problem List   Diagnosis Date Noted  . Adverse food reaction 01/10/2020  . Pollen-food allergy 01/10/2020  . Mild intermittent asthma without complication 96/28/3662  . Allergic conjunctivitis of both eyes 01/10/2020  . Other allergic rhinitis 01/10/2020  . Herpes simplex type 1 infection 06/01/2019  . Morbid obesity due to excess calories ([place]) 05/02/2019  . Iron deficiency 05/02/2019  . Obesity (BMI 30-39.9) 08/19/2018  . Pre-diabetes 02/18/2018  . OSA (obstructive sleep apnea) 05/28/2017  . Infection due to trichomonas 11/25/2016  . Tinnitus aurium, 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  . Family history of breast cancer 12/25/2015  . Migraines 09/28/2013  . Visit for preventive health examination 09/10/2013  . GERD (gastroesophageal reflux disease) 03/19/2013  .  Seasonal allergies 03/19/2013  . Neck pain, chronic 03/19/2013  . IBS (irritable bowel syndrome) 03/19/2013   Leighton Parody SPT 07/24/2020  [facility] [place], [place], [address] Phone: [phone]   Fax:  [phone]  Name: Natalie Vazquez MRN: [id number] Date of Birth: 10-07-1960   This entire session was performed under direct supervision and direction of a licensed therapist/therapist assistant . I have personally read, edited and approve of the note as written.  Lyndee Hensen, PT, DPT 2:31 PM  07/24/20

## 2020-07-24 NOTE — Patient Instructions (Signed)
Access Code: [email or web address] URL: [email or web address] Date: 07/24/2020 Prepared by: Lyndee Hensen  Exercises Single Knee to Chest Stretch - 2 x daily - 3 reps - 30 hold Side Lunge Adductor Stretch - 2 x daily - 3 reps - 10 hold

## 2020-07-29 ENCOUNTER — Encounter: Payer: Self-pay | Admitting: Physical Therapy

## 2020-07-29 ENCOUNTER — Ambulatory Visit: Payer: Federal, State, Local not specified - PPO | Attending: Physician Assistant | Admitting: Physical Therapy

## 2020-07-29 ENCOUNTER — Other Ambulatory Visit: Payer: Self-pay

## 2020-07-29 DIAGNOSIS — R252 Cramp and spasm: Secondary | ICD-10-CM | POA: Diagnosis not present

## 2020-07-29 DIAGNOSIS — M6281 Muscle weakness (generalized): Secondary | ICD-10-CM | POA: Insufficient documentation

## 2020-07-29 NOTE — Patient Instructions (Signed)
Access Code: [email or web address] URL: [email or web address] Date: 07/29/2020 Prepared by: Jari Favre  Exercises Supine Figure 4 Piriformis Stretch - 1 x daily - 7 x weekly - 1 sets - 3 reps - 30 sec hold Supine Piriformis Stretch with Leg Straight - 1 x daily - 7 x weekly - 1 sets - 3 reps - 30 sec hold Seated Piriformis Stretch - 1 x daily - 7 x weekly - 1 sets - 3 reps - 30 hold Seated Piriformis Stretch - 1 x daily - 7 x weekly - 1 sets - 3 reps - 30 sec hold Seated Hamstring Stretch - 1 x daily - 7 x weekly - 1 sets - 3 reps - 30 sec hold Standing Hamstring Stretch with Step - 1 x daily - 7 x weekly - 1 sets - 3 reps - 30 sec hold

## 2020-07-29 NOTE — Therapy (Signed)
[facility] [address], [place], [place], [address] Phone: [phone]   Fax:  [phone]  Physical Therapy Treatment  Patient Details  Name: Natalie Vazquez MRN: [id number] Date of Birth: Mar 23, 1961 Referring Provider (PT): Alyssa Allwardt   Encounter Date: 07/29/2020   PT End of Session - 07/29/20 0810    Visit Number 2    Authorization Type Blue Cross [facility]    PT Start Time [phone]    PT Stop Time [phone]    PT Time Calculation (min) 40 min    Activity Tolerance Patient tolerated treatment well    Behavior During Therapy [facility] for tasks assessed/performed           Past Medical History:  Diagnosis Date  . Allergic rhinitis due to other allergen   . Allergy    seasonal  . Anemia   . Arthritis   . Asthma    mild, intermittent  . Chicken pox   . Cholelithiasis   . CTS (carpal tunnel syndrome)   . Diabetes mellitus without complication ([place])   . Fibroids   . Fibromyalgia   . Frequent urination   . GERD (gastroesophageal reflux disease)   . Headache(784.0)   . Migraine   . PONV (postoperative nausea and vomiting)    difficulty waking up  . Problems related to high-risk sexual behavior    Unprotected sex  . Symptomatic menopausal or female climacteric states     Past Surgical History:  Procedure Laterality Date  . ABDOMINAL HYSTERECTOMY  2008   partial, ovaries remain  . CARPAL TUNNEL RELEASE Right 2015  . CHOLECYSTECTOMY  05/19/2011   Procedure: LAPAROSCOPIC CHOLECYSTECTOMY;  Surgeon: Zenovia Jarred, MD;  Location: [place];  Service: General;  Laterality: N/A;  . LASER ABLATION [place] / VULVAR    . PERCUTANEOUS PINNING  01/22/2012   Procedure: PERCUTANEOUS PINNING EXTREMITY;  Surgeon: Marybelle Killings, MD;  Location: [place];  Service: Orthopedics;  Laterality: Right;  Closed Reduction and Pinning right 5th Metacarpal Fracture  . TUBAL LIGATION    . WISDOM TOOTH EXTRACTION      There  were no vitals filed for this visit.   Subjective Assessment - 07/29/20 0805    Subjective Pt states she has prolapse bladder and sometimes pessary helps. Pt was racing grandson and pulled a muscle in the Rt glutes and feels the pain    Limitations Sitting;Standing;Walking;House hold activities    How long can you sit comfortably? 20 min    Patient Stated Goals decrease pain    Currently in Pain? Yes    Pain Score 4    10/10 at worst   Pain Location Buttocks    Pain Orientation Right    Pain Descriptors / Indicators Aching;Shooting    Pain Type Acute pain    Pain Onset More than a month ago    Pain Frequency Intermittent    Aggravating Factors  sitting a lot time, standing              [facility] PT Assessment - 07/29/20 0001      Assessment   Medical Diagnosis R glute pain    Referring Provider (PT) Alyssa Allwardt    Prior Therapy no      Precautions   Precautions None      Prior Function   Level of Independence Independent      Cognition   Overall Cognitive Status Within Functional Limits for tasks assessed  AROM   Right Hip Flexion 100    Right Hip ABduction 70    Right Hip ADduction 20    Left Hip External Rotation  --   mild limitation for ER   Left Hip Internal Rotation  --   WNL     Strength   Right Hip Flexion 4/5    Left Hip Flexion 4/5    Left Hip External Rotation 4/5    Left Hip Internal Rotation 4/5      Palpation   Palpation comment no pain in back, glute musculature, adductors, or hamstring; increased pain at R ischial tuberosity and into pelvic floor, suspect obturater internus      Special Tests   Other special tests no radicular pain, numbness/tingling,                      Pelvic Floor Special Questions - 07/29/20 0001    Prior Pregnancies Yes    Number of Pregnancies 5    Number of Vaginal Deliveries 5    Any difficulty with labor and deliveries Yes   cord and tearing   Currently Sexually Active No    Urinary Leakage Yes     How often sometimes    Pad use yes - liner 1/day    Activities that cause leaking Sneezing    Urinary urgency Yes    Fecal incontinence --   constipation -   Falling out feeling (prolapse) Yes    Activities that cause feeling of prolapse wears a pessary but feels it doens't    Perineal Body/Introitus  Descended    Pelvic Floor Internal Exam pt identity confirmed and internal soft tissue performed    Exam Type Vaginal    Strength fair squeeze, definite lift    Strength # of reps --   5 in 10 sec   Strength # of seconds 2    Tone high             [facility] Adult PT Treatment/Exercise - 07/29/20 0001      Neuro Re-ed    Neuro Re-ed Details  breathing and relax      Knee/Hip Exercises: Stretches   Other Knee/Hip Stretches piriformis and hamstring      Manual Therapy   Manual Therapy Internal Pelvic Floor    Internal Pelvic Floor levators and obdurator internus                  PT Education - 07/29/20 1003    Education Details Access Code: [email or web address]    Person(s) Educated Patient    Methods Explanation;Demonstration;Tactile cues;Verbal cues;Handout    Comprehension Verbalized understanding;Returned demonstration            PT Short Term Goals - 07/29/20 0954      PT SHORT TERM GOAL #1   Title Goals to be revisted after pt referral to pelvic floor physical therapy    Status Achieved             PT Long Term Goals - 07/29/20 0954      PT LONG TERM GOAL #1   Title Pt will be ind with advanced HEP    Time 12    Period Weeks    Status New    Target Date 10/21/20      PT LONG TERM GOAL #2   Title Pt will report pain of 1/10 at most when sitting/standing    Baseline up to 10    Time 12  Period Weeks    Status New    Target Date 10/21/20      PT LONG TERM GOAL #3   Title Pt will report 50% less leakage when coughing/sneezing    Time 12    Period Weeks    Status New    Target Date 10/21/20      PT LONG TERM GOAL #4   Title Pt will report she is  able to notice 50% improved prolapse symptoms    Time 12    Period Weeks    Status New    Target Date 10/21/20                 Plan - 07/29/20 1002    Clinical Impression Statement Pt presents to clinic after intial eval with [facility] ortho for glute pain.  Pt is being assess for pelvic floor issue today due to some symptoms reported at eval. Pt was found to have muscle spasm in Rt obdurator internus as well as levators all tight Rt>Lt.  Palpation of these muscles was pain that she felt referring to the affected region.  Pt has high tone pelvic floor and low endurance of 2 sec hold. Pt has LE weakness as mentioned above.  She will benefit from skilled PT to address impairments and pain management for improved function.    Examination-Activity Limitations Locomotion Level;Sit;Squat;Stairs;Stand;Transfers    Examination-Participation Restrictions Community Activity;Occupation    Stability/Clinical Decision Making Stable/Uncomplicated    Clinical Decision Making Low    Rehab Potential Good    PT Frequency 2x / week    PT Duration 12 weeks    PT Treatment/Interventions ADLs/Self Care Home Management;Iontophoresis 4mg /ml Dexamethasone;Moist Heat;Traction;Ultrasound;Cryotherapy;Electrical Stimulation;Gait training;Stair training;Functional mobility training;Therapeutic activities;Therapeutic exercise;Balance training;Neuromuscular re-education;Manual techniques;Passive range of motion;Dry needling;Taping;Spinal Manipulations;Joint Manipulations    PT Next Visit Plan pelvic floor muscle lengthening/STM OI and levators; f/u on initial stretches, add basic kegel and core strength    PT Home Exercise Plan Access Code: [email or web address]    Consulted and Agree with Plan of Care Patient           Patient will benefit from skilled therapeutic intervention in order to improve the following deficits and impairments:  Pain,Decreased mobility,Increased muscle spasms,Decreased activity tolerance,Decreased  strength  Visit Diagnosis: Muscle weakness (generalized)  Cramp and spasm     Problem List Patient Active Problem List   Diagnosis Date Noted  . Adverse food reaction 01/10/2020  . Pollen-food allergy 01/10/2020  . Mild intermittent asthma without complication 32/99/2426  . Allergic conjunctivitis of both eyes 01/10/2020  . Other allergic rhinitis 01/10/2020  . Herpes simplex type 1 infection 06/01/2019  . Morbid obesity due to excess calories ([place]) 05/02/2019  . Iron deficiency 05/02/2019  . Obesity (BMI 30-39.9) 08/19/2018  . Pre-diabetes 02/18/2018  . OSA (obstructive sleep apnea) 05/28/2017  . Infection due to trichomonas 11/25/2016  . Tinnitus aurium, 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  . Family history of breast cancer 12/25/2015  . Migraines 09/28/2013  . Visit for preventive health examination 09/10/2013  . GERD (gastroesophageal reflux disease) 03/19/2013  . Seasonal allergies 03/19/2013  . Neck pain, chronic 03/19/2013  . IBS (irritable bowel syndrome) 03/19/2013    Jule Ser, PT 07/29/2020, 10:16 AM  [facility] [address], [place], [place], [address] Phone: [phone]   Fax:  [phone]  Name: Natalie Vazquez MRN: [id number] Date of Birth: Feb 09, 1961

## 2020-08-05 ENCOUNTER — Other Ambulatory Visit: Payer: Self-pay

## 2020-08-05 ENCOUNTER — Ambulatory Visit: Payer: Federal, State, Local not specified - PPO | Admitting: Physical Therapy

## 2020-08-05 ENCOUNTER — Encounter: Payer: Self-pay | Admitting: Physical Therapy

## 2020-08-05 DIAGNOSIS — R252 Cramp and spasm: Secondary | ICD-10-CM | POA: Diagnosis not present

## 2020-08-05 DIAGNOSIS — M6281 Muscle weakness (generalized): Secondary | ICD-10-CM | POA: Diagnosis not present

## 2020-08-05 NOTE — Therapy (Signed)
[facility] [address] [place], [place], [address] Phone: [phone]   Fax:  [phone]  Physical Therapy Treatment  Patient Details  Name: Natalie Vazquez MRN: [id number] Date of Birth: 05-Jul-1960 Referring Provider (PT): Alyssa Allwardt   Encounter Date: 08/05/2020   PT End of Session - 08/05/20 0758    Visit Number 3    Date for PT Re-Evaluation 10/21/20    Authorization Type Blue Cross [place]    PT Start Time [phone]    PT Stop Time [phone]    PT Time Calculation (min) 40 min    Activity Tolerance Patient tolerated treatment well    Behavior During Therapy [facility] for tasks assessed/performed           Past Medical History:  Diagnosis Date  . Allergic rhinitis due to other allergen   . Allergy    seasonal  . Anemia   . Arthritis   . Asthma    mild, intermittent  . Chicken pox   . Cholelithiasis   . CTS (carpal tunnel syndrome)   . Diabetes mellitus without complication ([place])   . Fibroids   . Fibromyalgia   . Frequent urination   . GERD (gastroesophageal reflux disease)   . Headache(784.0)   . Migraine   . PONV (postoperative nausea and vomiting)    difficulty waking up  . Problems related to high-risk sexual behavior    Unprotected sex  . Symptomatic menopausal or female climacteric states     Past Surgical History:  Procedure Laterality Date  . ABDOMINAL HYSTERECTOMY  2008   partial, ovaries remain  . CARPAL TUNNEL RELEASE Right 2015  . CHOLECYSTECTOMY  05/19/2011   Procedure: LAPAROSCOPIC CHOLECYSTECTOMY;  Surgeon: Zenovia Jarred, MD;  Location: [place];  Service: General;  Laterality: N/A;  . LASER ABLATION [place] / VULVAR    . PERCUTANEOUS PINNING  01/22/2012   Procedure: PERCUTANEOUS PINNING EXTREMITY;  Surgeon: Marybelle Killings, MD;  Location: [place];  Service: Orthopedics;  Laterality: Right;  Closed Reduction and Pinning right 5th Metacarpal Fracture  . TUBAL LIGATION    .  WISDOM TOOTH EXTRACTION      There were no vitals filed for this visit.   Subjective Assessment - 08/05/20 0948    Subjective It hurt yesterday driving to/from [place]. Now it is not bad.    How long can you sit comfortably? 20 min    Patient Stated Goals decrease pain    Currently in Pain? Yes    Pain Location Buttocks    Pain Orientation Right    Pain Descriptors / Indicators Aching    Pain Type Acute pain    Pain Onset More than a month ago    Multiple Pain Sites No                             [facility] Adult PT Treatment/Exercise - 08/05/20 0001      Self-Care   Self-Care Other Self-Care Comments    Other Self-Care Comments  self massage with towel to isciocav and TP muscels in pelvic floor; toileting techniques      Neuro Re-ed    Neuro Re-ed Details  breathing and relax bulging with toileting and stretches, transverse abdominus contraction      Lumbar Exercises: Supine   Bent Knee Raise 10 reps    Other Supine Lumbar Exercises bent knee fallout  Knee/Hip Exercises: Stretches   Other Knee/Hip Stretches piriformis and hamstring - 2 x 30 sec with breathing      Manual Therapy   Manual Therapy Internal Pelvic Floor    Manual therapy comments pt identity con    Internal Pelvic Floor iscio and TP externally; levators and obdurator internus Rt>Lt                    PT Short Term Goals - 07/29/20 0954      PT SHORT TERM GOAL #1   Title Goals to be revisted after pt referral to pelvic floor physical therapy    Status Achieved             PT Long Term Goals - 08/05/20 0803      PT LONG TERM GOAL #1   Title Pt will be ind with advanced HEP    Status On-going                 Plan - 08/05/20 0803    Clinical Impression Statement Pt was given core exercise to begin working on posture strength.  She has made improvement from initial treatment with stretches and self massage to perineal muscles.  Pt tolerated STM to OI and  levators and TP today without much tenderness but did get good release.  She was educated in toileting techniqes.  Pt will benefit from skilled PT to continue towards functional goals.    PT Treatment/Interventions ADLs/Self Care Home Management;Iontophoresis 4mg /ml Dexamethasone;Moist Heat;Traction;Ultrasound;Cryotherapy;Electrical Stimulation;Gait training;Stair training;Functional mobility training;Therapeutic activities;Therapeutic exercise;Balance training;Neuromuscular re-education;Manual techniques;Passive range of motion;Dry needling;Taping;Spinal Manipulations;Joint Manipulations    PT Next Visit Plan core strength, squat, knack    PT Home Exercise Plan Access Code: [email or web address], toilet techniques and self massage sit on noodle    Consulted and Agree with Plan of Care Patient           Patient will benefit from skilled therapeutic intervention in order to improve the following deficits and impairments:  Pain,Decreased mobility,Increased muscle spasms,Decreased activity tolerance,Decreased strength  Visit Diagnosis: Muscle weakness (generalized)  Cramp and spasm     Problem List Patient Active Problem List   Diagnosis Date Noted  . Adverse food reaction 01/10/2020  . Pollen-food allergy 01/10/2020  . Mild intermittent asthma without complication 16/94/5038  . Allergic conjunctivitis of both eyes 01/10/2020  . Other allergic rhinitis 01/10/2020  . Herpes simplex type 1 infection 06/01/2019  . Morbid obesity due to excess calories ([place]) 05/02/2019  . Iron deficiency 05/02/2019  . Obesity (BMI 30-39.9) 08/19/2018  . Pre-diabetes 02/18/2018  . OSA (obstructive sleep apnea) 05/28/2017  . Infection due to trichomonas 11/25/2016  . Tinnitus aurium, 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  . Family history of breast cancer 12/25/2015  . Migraines 09/28/2013  . Visit for preventive health examination 09/10/2013  . GERD (gastroesophageal reflux disease) 03/19/2013  . Seasonal allergies 03/19/2013  .  Neck pain, chronic 03/19/2013  . IBS (irritable bowel syndrome) 03/19/2013    Jule Ser, PT 08/05/2020, 9:49 AM  [facility] [address] [place], [place], [address] Phone: [phone]   Fax:  [phone]  Name: Natalie Vazquez MRN: [id number] Date of Birth: 1961-01-16

## 2020-08-16 ENCOUNTER — Encounter: Payer: Self-pay | Admitting: Physical Therapy

## 2020-08-16 ENCOUNTER — Other Ambulatory Visit: Payer: Self-pay

## 2020-08-16 ENCOUNTER — Ambulatory Visit: Payer: Federal, State, Local not specified - PPO | Attending: Physician Assistant | Admitting: Physical Therapy

## 2020-08-16 DIAGNOSIS — R252 Cramp and spasm: Secondary | ICD-10-CM | POA: Diagnosis not present

## 2020-08-16 DIAGNOSIS — M6281 Muscle weakness (generalized): Secondary | ICD-10-CM | POA: Diagnosis not present

## 2020-08-16 NOTE — Therapy (Signed)
[facility] [address], [place], [place], [address] Phone: [phone]   Fax:  [phone]  Physical Therapy Treatment  Patient Details  Name: Natalie Vazquez MRN: [id number] Date of Birth: 1960/06/02 Referring Provider (PT): Alyssa Allwardt   Encounter Date: 08/16/2020   PT End of Session - 08/16/20 0851    Visit Number 4    Date for PT Re-Evaluation 10/21/20    Authorization Type Blue Cross [place]    PT Start Time [phone]    PT Stop Time [phone]    PT Time Calculation (min) 42 min    Activity Tolerance Patient tolerated treatment well    Behavior During Therapy [facility] for tasks assessed/performed           Past Medical History:  Diagnosis Date  . Allergic rhinitis due to other allergen   . Allergy    seasonal  . Anemia   . Arthritis   . Asthma    mild, intermittent  . Chicken pox   . Cholelithiasis   . CTS (carpal tunnel syndrome)   . Diabetes mellitus without complication ([place])   . Fibroids   . Fibromyalgia   . Frequent urination   . GERD (gastroesophageal reflux disease)   . Headache(784.0)   . Migraine   . PONV (postoperative nausea and vomiting)    difficulty waking up  . Problems related to high-risk sexual behavior    Unprotected sex  . Symptomatic menopausal or female climacteric states     Past Surgical History:  Procedure Laterality Date  . ABDOMINAL HYSTERECTOMY  2008   partial, ovaries remain  . CARPAL TUNNEL RELEASE Right 2015  . CHOLECYSTECTOMY  05/19/2011   Procedure: LAPAROSCOPIC CHOLECYSTECTOMY;  Surgeon: Zenovia Jarred, MD;  Location: [place];  Service: General;  Laterality: N/A;  . LASER ABLATION [place] / VULVAR    . PERCUTANEOUS PINNING  01/22/2012   Procedure: PERCUTANEOUS PINNING EXTREMITY;  Surgeon: Marybelle Killings, MD;  Location: [place];  Service: Orthopedics;  Laterality: Right;  Closed Reduction and Pinning right 5th Metacarpal Fracture  . TUBAL LIGATION    .  WISDOM TOOTH EXTRACTION      There were no vitals filed for this visit.   Subjective Assessment - 08/16/20 0850    Subjective Soreness from working out but not sore    Patient Stated Goals decrease pain    Currently in Pain? No/denies                             [facility] Adult PT Treatment/Exercise - 08/16/20 0001      Lumbar Exercises: Standing   Shoulder Extension Strengthening;Both;20 reps;Theraband    Theraband Level (Shoulder Extension) Level 4 (Blue)    Other Standing Lumbar Exercises pallof press staggared stance - 10 x 2 ways      Lumbar Exercises: Supine   Clam 20 reps   yellow loop bil and single leg   Bent Knee Raise 20 reps   yellow loop   Dead Bug Limitations UE static; march LE - 20x      Knee/Hip Exercises: Stretches   Other Knee/Hip Stretches hip flexor and hamstring on steps- 1 min each      Knee/Hip Exercises: Standing   Extension Limitations yellow loop  - diagonal ext - 10x each    Other Standing Knee Exercises side step yellow loop - 2 x across carpet and back  PT Education - 08/16/20 0936    Education Details [email or web address]    Person(s) Educated Patient    Methods Explanation;Demonstration;Tactile cues;Verbal cues;Handout    Comprehension Verbalized understanding;Returned demonstration            PT Short Term Goals - 07/29/20 0954      PT SHORT TERM GOAL #1   Title Goals to be revisted after pt referral to pelvic floor physical therapy    Status Achieved             PT Long Term Goals - 08/16/20 0940      PT LONG TERM GOAL #1   Title Pt will be ind with advanced HEP    Status On-going      PT LONG TERM GOAL #2   Title Pt will report pain of 1/10 at most when sitting/standing    Baseline none this week, very minimal    Status Partially Met      PT LONG TERM GOAL #3   Title Pt will report 50% less leakage when coughing/sneezing    Status On-going      PT LONG TERM GOAL #4   Title Pt will report  she is able to notice 50% improved prolapse symptoms    Status On-going                 Plan - 08/16/20 0937    Clinical Impression Statement Pt has made good progress with decreased pain.  Pt is working out at [employer] and no increased pain currently.  Pt needs cues during exercises to maintain neutral spine and she was educated in engaged core during exercises today.  Pt will benefit from skilled PT in order to return to full functional activities without pain.    PT Treatment/Interventions ADLs/Self Care Home Management;Iontophoresis 3m/ml Dexamethasone;Moist Heat;Traction;Ultrasound;Cryotherapy;Electrical Stimulation;Gait training;Stair training;Functional mobility training;Therapeutic activities;Therapeutic exercise;Balance training;Neuromuscular re-education;Manual techniques;Passive range of motion;Dry needling;Taping;Spinal Manipulations;Joint Manipulations    PT Next Visit Plan re-assess goals; core strength, squat, knack    PT Home Exercise Plan Access Code: [email or web address] toilet techniques and self massage sit on noodle    Consulted and Agree with Plan of Care Patient           Patient will benefit from skilled therapeutic intervention in order to improve the following deficits and impairments:  Pain,Decreased mobility,Increased muscle spasms,Decreased activity tolerance,Decreased strength  Visit Diagnosis: Muscle weakness (generalized)  Cramp and spasm     Problem List Patient Active Problem List   Diagnosis Date Noted  . Adverse food reaction 01/10/2020  . Pollen-food allergy 01/10/2020  . Mild intermittent asthma without complication 036/64/4034 . Allergic conjunctivitis of both eyes 01/10/2020  . Other allergic rhinitis 01/10/2020  . Herpes simplex type 1 infection 06/01/2019  . Morbid obesity due to excess calories ([place] 05/02/2019  . Iron deficiency 05/02/2019  . Obesity (BMI 30-39.9) 08/19/2018  . Pre-diabetes 02/18/2018  . OSA (obstructive sleep apnea)  05/28/2017  . Infection due to trichomonas 11/25/2016  . Tinnitus aurium, 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  . Family history of breast cancer 12/25/2015  . Migraines 09/28/2013  . Visit for preventive health examination 09/10/2013  . GERD (gastroesophageal reflux disease) 03/19/2013  . Seasonal allergies 03/19/2013  . Neck pain, chronic 03/19/2013  . IBS (irritable bowel syndrome) 03/19/2013    JJule Ser PT 08/16/2020, 9:47 AM  [facility] [address] [place] [address]: [phone]  Fax:  [phone] Name: Natalie Yaklin  Vazquez MRN: [id number] Date of Birth: 03/10/1961

## 2020-08-16 NOTE — Patient Instructions (Signed)
[email or web address]

## 2020-08-28 DIAGNOSIS — R519 Headache, unspecified: Secondary | ICD-10-CM | POA: Diagnosis not present

## 2020-08-28 DIAGNOSIS — G43019 Migraine without aura, intractable, without status migrainosus: Secondary | ICD-10-CM | POA: Diagnosis not present

## 2020-09-03 ENCOUNTER — Ambulatory Visit: Payer: Federal, State, Local not specified - PPO | Admitting: Physical Therapy

## 2020-09-03 ENCOUNTER — Encounter: Payer: Self-pay | Admitting: Physical Therapy

## 2020-09-03 ENCOUNTER — Other Ambulatory Visit: Payer: Self-pay

## 2020-09-03 DIAGNOSIS — M6281 Muscle weakness (generalized): Secondary | ICD-10-CM | POA: Diagnosis not present

## 2020-09-03 DIAGNOSIS — R252 Cramp and spasm: Secondary | ICD-10-CM

## 2020-09-03 NOTE — Therapy (Signed)
[facility] [address], [place], [place], [address] Phone: [phone]   Fax:  [phone]  Physical Therapy Treatment  Patient Details  Name: Natalie Vazquez MRN: [id number] Date of Birth: August 02, 1960 Referring Provider (PT): Alyssa Allwardt   Encounter Date: 09/03/2020   PT End of Session - 09/03/20 0818    Visit Number 5    Date for PT Re-Evaluation 10/21/20    Authorization Type [employer]    PT Start Time 0801    PT Stop Time 0840    PT Time Calculation (min) 39 min    Activity Tolerance Patient tolerated treatment well    Behavior During Therapy [facility] for tasks assessed/performed           Past Medical History:  Diagnosis Date  . Allergic rhinitis due to other allergen   . Allergy    seasonal  . Anemia   . Arthritis   . Asthma    mild, intermittent  . Chicken pox   . Cholelithiasis   . CTS (carpal tunnel syndrome)   . Diabetes mellitus without complication ([place])   . Fibroids   . Fibromyalgia   . Frequent urination   . GERD (gastroesophageal reflux disease)   . Headache(784.0)   . Migraine   . PONV (postoperative nausea and vomiting)    difficulty waking up  . Problems related to high-risk sexual behavior    Unprotected sex  . Symptomatic menopausal or female climacteric states     Past Surgical History:  Procedure Laterality Date  . ABDOMINAL HYSTERECTOMY  2008   partial, ovaries remain  . CARPAL TUNNEL RELEASE Right 2015  . CHOLECYSTECTOMY  05/19/2011   Procedure: LAPAROSCOPIC CHOLECYSTECTOMY;  Surgeon: Zenovia Jarred, MD;  Location: [place];  Service: General;  Laterality: N/A;  . LASER ABLATION [place] / VULVAR    . PERCUTANEOUS PINNING  01/22/2012   Procedure: PERCUTANEOUS PINNING EXTREMITY;  Surgeon: Marybelle Killings, MD;  Location: [place];  Service: Orthopedics;  Laterality: Right;  Closed Reduction and Pinning right 5th Metacarpal Fracture  . TUBAL LIGATION    .  WISDOM TOOTH EXTRACTION      There were no vitals filed for this visit.   Subjective Assessment - 09/03/20 0805    Subjective Pt states just sore after sitting a long time and to and from [place] the pain was full blown 10/10.  After 10-15 min pain was gone after getting out the car and pressure on the Rt glute    Patient Stated Goals decrease pain    Currently in Pain? No/denies                             [facility] Adult PT Treatment/Exercise - 09/03/20 0001      Lumbar Exercises: Standing   Functional Squats 15 reps    Functional Squats Limitations mini squat cues to activate pelvic floor    Other Standing Lumbar Exercises hip ext and side step -      Lumbar Exercises: Supine   Dead Bug Limitations UE alt with LE 90-90 3x10      Knee/Hip Exercises: Stretches   Piriformis Stretch Right;Left;2 reps;30 seconds      Knee/Hip Exercises: Aerobic   Nustep L3 seat 11UE12- 6 minPT present for status update      Knee/Hip Exercises: Seated   Sit to Sand 3 sets   1 rep with cues to  keep pelvic floor engaged - unable to do for full ROM                 PT Education - 09/03/20 0844    Education Details [id number] - added mini squat and 90-90 position alt UE to knees    Person(s) Educated Patient    Methods Explanation;Demonstration;Tactile cues;Verbal cues;Handout    Comprehension Verbalized understanding;Returned demonstration            PT Short Term Goals - 07/29/20 0954      PT SHORT TERM GOAL #1   Title Goals to be revisted after pt referral to pelvic floor physical therapy    Status Achieved             PT Long Term Goals - 09/03/20 0806      PT LONG TERM GOAL #1   Title Pt will be ind with advanced HEP    Status On-going      PT LONG TERM GOAL #2   Title Pt will report pain of 1/10 at most when sitting/standing    Baseline can sit for an hour with barely any pain      PT LONG TERM GOAL #3   Title Pt will report 50% less leakage when  coughing/sneezing      PT LONG TERM GOAL #4   Title Pt will report she is able to notice 50% improved prolapse symptoms    Baseline still about the same, comes down towards the end of the day    Status On-going                 Plan - 09/03/20 0848    Clinical Impression Statement Pt making progress with reduced pain and reports no leakage.  Pain still with sitting becomes significant.  Pt still having prolapse that is not currently managed with pessary like it used to be.  Pt needed cues to engage pelvic floor and nuetral spine during squats.  Pt will benefit from exercise progressions and posture strengthening    PT Treatment/Interventions ADLs/Self Care Home Management;Iontophoresis 4mg /ml Dexamethasone;Moist Heat;Traction;Ultrasound;Cryotherapy;Electrical Stimulation;Gait training;Stair training;Functional mobility training;Therapeutic activities;Therapeutic exercise;Balance training;Neuromuscular re-education;Manual techniques;Passive range of motion;Dry needling;Taping;Spinal Manipulations;Joint Manipulations    PT Next Visit Plan review posture with lumbar support for driving and sitting; lunge or sliders with kegel, half kneel with lifting weight kegel    PT Home Exercise Plan Access Code: [email or web address], toilet techniques and self massage sit on noodle    Consulted and Agree with Plan of Care Patient           Patient will benefit from skilled therapeutic intervention in order to improve the following deficits and impairments:  Pain,Decreased mobility,Increased muscle spasms,Decreased activity tolerance,Decreased strength  Visit Diagnosis: Muscle weakness (generalized)  Cramp and spasm     Problem List Patient Active Problem List   Diagnosis Date Noted  . Adverse food reaction 01/10/2020  . Pollen-food allergy 01/10/2020  . Mild intermittent asthma without complication 59/56/3875  . Allergic conjunctivitis of both eyes 01/10/2020  . Other allergic rhinitis 01/10/2020  .  Herpes simplex type 1 infection 06/01/2019  . Morbid obesity due to excess calories ([place]) 05/02/2019  . Iron deficiency 05/02/2019  . Obesity (BMI 30-39.9) 08/19/2018  . Pre-diabetes 02/18/2018  . OSA (obstructive sleep apnea) 05/28/2017  . Infection due to trichomonas 11/25/2016  . Tinnitus aurium, 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  . Family history of breast cancer 12/25/2015  . Migraines 09/28/2013  . Visit for preventive health examination 09/10/2013  .  GERD (gastroesophageal reflux disease) 03/19/2013  . Seasonal allergies 03/19/2013  . Neck pain, chronic 03/19/2013  . IBS (irritable bowel syndrome) 03/19/2013    Jule Ser, PT 09/03/2020, 8:51 AM  [facility] [address] [place], [place], [address] Phone: [phone]   Fax:  [phone]  Name: Natalie Vazquez MRN: [id number] Date of Birth: Dec 19, 1960

## 2020-09-03 NOTE — Patient Instructions (Signed)
Access Code: [email or web address] URL: [email or web address] Date: 09/03/2020 Prepared by: Jari Favre  Exercises Supine Figure 4 Piriformis Stretch - 1 x daily - 7 x weekly - 1 sets - 3 reps - 30 sec hold Supine Piriformis Stretch with Leg Straight - 1 x daily - 7 x weekly - 1 sets - 3 reps - 30 sec hold Seated Piriformis Stretch - 1 x daily - 7 x weekly - 1 sets - 3 reps - 30 hold Seated Piriformis Stretch - 1 x daily - 7 x weekly - 1 sets - 3 reps - 30 sec hold Seated Hamstring Stretch - 1 x daily - 7 x weekly - 1 sets - 3 reps - 30 sec hold Standing Hamstring Stretch with Step - 1 x daily - 7 x weekly - 1 sets - 3 reps - 30 sec hold Bent Knee Fallouts - 1 x daily - 7 x weekly - 3 sets - 10 reps Hooklying Isometric Clamshell - 1 x daily - 7 x weekly - 3 sets - 10 reps Bridge with Hip Abduction and Resistance - 1 x daily - 7 x weekly - 3 sets - 10 reps Side Stepping with Resistance at Thighs - 1 x daily - 7 x weekly - 3 sets - 10 reps Supine 90/90 Abdominal Bracing - 1 x daily - 7 x weekly - 3 sets - 10 reps Mini Squat with Pelvic Floor Contraction - 1 x daily - 7 x weekly - 2 sets - 10 reps

## 2020-09-12 ENCOUNTER — Other Ambulatory Visit: Payer: Self-pay

## 2020-09-12 ENCOUNTER — Ambulatory Visit: Payer: Federal, State, Local not specified - PPO | Attending: Physician Assistant | Admitting: Physical Therapy

## 2020-09-12 ENCOUNTER — Encounter: Payer: Self-pay | Admitting: Physical Therapy

## 2020-09-12 DIAGNOSIS — M6281 Muscle weakness (generalized): Secondary | ICD-10-CM | POA: Diagnosis not present

## 2020-09-12 DIAGNOSIS — R252 Cramp and spasm: Secondary | ICD-10-CM

## 2020-09-12 NOTE — Therapy (Signed)
[facility] [address] [place], [place], [address] Phone: [phone]   Fax:  [phone]  Physical Therapy Treatment  Patient Details  Name: Natalie Vazquez MRN: [id number] Date of Birth: 11-13-1960 Referring Provider (PT): Alyssa Allwardt   Encounter Date: 09/12/2020   PT End of Session - 09/12/20 0928    Visit Number 6    Date for PT Re-Evaluation 10/21/20    Authorization Type [employer] [place]    PT Start Time [phone]   late   PT Stop Time 0930    PT Time Calculation (min) 37 min    Activity Tolerance Patient tolerated treatment well    Behavior During Therapy [facility] for tasks assessed/performed           Past Medical History:  Diagnosis Date  . Allergic rhinitis due to other allergen   . Allergy    seasonal  . Anemia   . Arthritis   . Asthma    mild, intermittent  . Chicken pox   . Cholelithiasis   . CTS (carpal tunnel syndrome)   . Diabetes mellitus without complication ([place])   . Fibroids   . Fibromyalgia   . Frequent urination   . GERD (gastroesophageal reflux disease)   . Headache(784.0)   . Migraine   . PONV (postoperative nausea and vomiting)    difficulty waking up  . Problems related to high-risk sexual behavior    Unprotected sex  . Symptomatic menopausal or female climacteric states     Past Surgical History:  Procedure Laterality Date  . ABDOMINAL HYSTERECTOMY  2008   partial, ovaries remain  . CARPAL TUNNEL RELEASE Right 2015  . CHOLECYSTECTOMY  05/19/2011   Procedure: LAPAROSCOPIC CHOLECYSTECTOMY;  Surgeon: Zenovia Jarred, MD;  Location: [place];  Service: General;  Laterality: N/A;  . LASER ABLATION [place] / VULVAR    . PERCUTANEOUS PINNING  01/22/2012   Procedure: PERCUTANEOUS PINNING EXTREMITY;  Surgeon: Marybelle Killings, MD;  Location: [place];  Service: Orthopedics;  Laterality: Right;  Closed Reduction and Pinning right 5th Metacarpal Fracture  . TUBAL LIGATION     . WISDOM TOOTH EXTRACTION      There were no vitals filed for this visit.   Subjective Assessment - 09/12/20 0930    Subjective Sore from the gym.    Patient Stated Goals decrease pain    Currently in Pain? No/denies                             [facility] Adult PT Treatment/Exercise - 09/12/20 0001      Lumbar Exercises: Supine   Other Supine Lumbar Exercises lying on foam roller: bent knee fallout; alt march;clamshell yellow loop - 15x each      Knee/Hip Exercises: Supine   Bridges Strengthening;Both;10 reps   yellow loop     Knee/Hip Exercises: Sidelying   Clams yellow loop - 15 x each side                    PT Short Term Goals - 07/29/20 0954      PT SHORT TERM GOAL #1   Title Goals to be revisted after pt referral to pelvic floor physical therapy    Status Achieved             PT Long Term Goals - 09/12/20 0856      PT LONG TERM GOAL #1  Title Pt will be ind with advanced HEP    Status Achieved      PT LONG TERM GOAL #2   Title Pt will report pain of 1/10 at most when sitting/standing    Baseline driving still causes pain, but sitting/standing no pain    Status Achieved      PT LONG TERM GOAL #3   Title Pt will report 50% less leakage when coughing/sneezing    Status Achieved      PT LONG TERM GOAL #4   Title Pt will report she is able to notice 50% improved prolapse symptoms    Baseline still about the same, comes down towards the end of the day    Status On-going                 Plan - 09/12/20 0934    Clinical Impression Statement Pt still has mild pain and main issue is when driving she is having increased pain.  Pain is managed at work and she is not having leakage.  Pt is doing well with exercises at the gym.  She still needs some cues for posture during exercises.    PT Treatment/Interventions ADLs/Self Care Home Management;Iontophoresis 4mg /ml Dexamethasone;Moist Heat;Traction;Ultrasound;Cryotherapy;Electrical  Stimulation;Gait training;Stair training;Functional mobility training;Therapeutic activities;Therapeutic exercise;Balance training;Neuromuscular re-education;Manual techniques;Passive range of motion;Dry needling;Taping;Spinal Manipulations;Joint Manipulations    PT Next Visit Plan review posture with lumbar support for driving and sitting; lunge or sliders with kegel, half kneel with lifting weight kegel    PT Home Exercise Plan Access Code: [email or web address], toilet techniques and self massage sit on noodle    Consulted and Agree with Plan of Care Patient           Patient will benefit from skilled therapeutic intervention in order to improve the following deficits and impairments:  Pain,Decreased mobility,Increased muscle spasms,Decreased activity tolerance,Decreased strength  Visit Diagnosis: Muscle weakness (generalized)  Cramp and spasm     Problem List Patient Active Problem List   Diagnosis Date Noted  . Adverse food reaction 01/10/2020  . Pollen-food allergy 01/10/2020  . Mild intermittent asthma without complication 65/68/1275  . Allergic conjunctivitis of both eyes 01/10/2020  . Other allergic rhinitis 01/10/2020  . Herpes simplex type 1 infection 06/01/2019  . Morbid obesity due to excess calories ([place]) 05/02/2019  . Iron deficiency 05/02/2019  . Obesity (BMI 30-39.9) 08/19/2018  . Pre-diabetes 02/18/2018  . OSA (obstructive sleep apnea) 05/28/2017  . Infection due to trichomonas 11/25/2016  . Tinnitus aurium, 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  . Family history of breast cancer 12/25/2015  . Migraines 09/28/2013  . Visit for preventive health examination 09/10/2013  . GERD (gastroesophageal reflux disease) 03/19/2013  . Seasonal allergies 03/19/2013  . Neck pain, chronic 03/19/2013  . IBS (irritable bowel syndrome) 03/19/2013    Jule Ser, PT 09/12/2020, 9:36 AM  [facility] [address] [place], [place],  [address] Phone: [phone]   Fax:  [phone]  Name: EMILLIE CHASEN MRN: [id number] Date of Birth: [birth year]

## 2020-09-17 ENCOUNTER — Ambulatory Visit: Payer: Federal, State, Local not specified - PPO | Admitting: Physical Therapy

## 2020-09-17 ENCOUNTER — Other Ambulatory Visit: Payer: Self-pay

## 2020-09-17 ENCOUNTER — Encounter: Payer: Self-pay | Admitting: Physical Therapy

## 2020-09-17 DIAGNOSIS — M6281 Muscle weakness (generalized): Secondary | ICD-10-CM

## 2020-09-17 DIAGNOSIS — R252 Cramp and spasm: Secondary | ICD-10-CM

## 2020-09-17 NOTE — Therapy (Addendum)
[facility] [address] [place], [place], [address] Phone: [phone]   Fax:  [phone]  Physical Therapy Treatment and Discharge Note  Patient Details  Name: Natalie Vazquez MRN: [id number] Date of Birth: March 13, 1961 Referring Provider (PT): Alyssa Allwardt  PHYSICAL THERAPY DISCHARGE SUMMARY  Visits from Start of Care: 7  Current functional level related to goals / functional outcomes: Unable to assess due to unplanned discharge    Remaining deficits: Unable to assess due to unplanned discharge    Education / Equipment: Unable to assess due to unplanned discharge   Patient agrees to discharge. Patient goals were not met. Patient is being discharged due to not returning since the last visit.  Signing therapist has never treated or seen patient, but is discharging patient on behalf of last treating therapist secondary to long standing open episode that needs closing.  12:55 PM, 04/21/21 Jerene Pitch, DPT Physical Therapy with [facility]  [phone] office   Encounter Date: 09/17/2020   PT End of Session - 09/17/20 0809     Visit Number 7    Date for PT Re-Evaluation 10/21/20    Authorization Type [employer] Cross [place]    PT Start Time [phone]   late   PT Stop Time 0848    PT Time Calculation (min) 39 min    Activity Tolerance Patient tolerated treatment well    Behavior During Therapy [facility] for tasks assessed/performed             Past Medical History:  Diagnosis Date   Allergic rhinitis due to other allergen    Allergy    seasonal   Anemia    Arthritis    Asthma    mild, intermittent   Chicken pox    Cholelithiasis    CTS (carpal tunnel syndrome)    Diabetes mellitus without complication ([facility])    Fibroids    Fibromyalgia    Frequent urination    GERD (gastroesophageal reflux disease)    Headache(784.0)    Migraine    PONV (postoperative nausea and vomiting)     difficulty waking up   Problems related to high-risk sexual behavior    Unprotected sex   Symptomatic menopausal or female climacteric states     Past Surgical History:  Procedure Laterality Date   ABDOMINAL HYSTERECTOMY  2008   partial, ovaries remain   CARPAL TUNNEL RELEASE Right 2015   CHOLECYSTECTOMY  05/19/2011   Procedure: LAPAROSCOPIC CHOLECYSTECTOMY;  Surgeon: Zenovia Jarred, MD;  Location: [place];  Service: General;  Laterality: N/A;   LASER ABLATION [place] / [place]  01/22/2012   Procedure: PERCUTANEOUS PINNING EXTREMITY;  Surgeon: Marybelle Killings, MD;  Location: [place];  Service: Orthopedics;  Laterality: Right;  Closed Reduction and Pinning right 5th Metacarpal Fracture   TUBAL LIGATION     WISDOM TOOTH EXTRACTION      There were no vitals filed for this visit.   Subjective Assessment - 09/17/20 0923     Subjective Pt is doing well, still pain after sitting 45 min in car.  No pain currently.    Currently in Pain? No/denies                               [facility] Adult PT Treatment/Exercise - 09/17/20 0001       Lumbar Exercises: Standing   Lifting From  12";20 reps   sumo squat with 10lb kettle bell   Other Standing Lumbar Exercises half knee lifting      Knee/Hip Exercises: Stretches   Other Knee/Hip Stretches half knee hip flexor and hamstring - 2 x 30 each side    Other Knee/Hip Stretches half pidgeon pose on mat table - 2x 30 sec each side      Knee/Hip Exercises: Aerobic   Nustep L4 x 5 min seat 10/UE 11      Knee/Hip Exercises: Standing   Hip ADduction Limitations 2x10 red band    Abduction Limitations 10x red band      Manual Therapy   Manual therapy comments gluteals bil with addaday            sidelying hip IR 20x each side          PT Short Term Goals - 07/29/20 0954       PT SHORT TERM GOAL #1   Title Goals to be revisted after pt referral to pelvic floor physical therapy     Status Achieved               PT Long Term Goals - 09/17/20 0820       PT LONG TERM GOAL #1   Title Pt will be ind with advanced HEP    Status On-going                   Plan - 09/17/20 0812     Clinical Impression Statement Today's session focused on improved core and gluteal and hip strength for continued improvement in hip/groin pain and to demonstrate functional activities without compensatory movements    PT Treatment/Interventions ADLs/Self Care Home Management;Iontophoresis 64m/ml Dexamethasone;Moist Heat;Traction;Ultrasound;Cryotherapy;Electrical Stimulation;Gait training;Stair training;Functional mobility training;Therapeutic activities;Therapeutic exercise;Balance training;Neuromuscular re-education;Manual techniques;Passive range of motion;Dry needling;Taping;Spinal Manipulations;Joint Manipulations    PT Home Exercise Plan Access Code: [email or web address] toilet techniques and self massage sit on noodle             Patient will benefit from skilled therapeutic intervention in order to improve the following deficits and impairments:  Pain,Decreased mobility,Increased muscle spasms,Decreased activity tolerance,Decreased strength  Visit Diagnosis: Muscle weakness (generalized)  Cramp and spasm     Problem List Patient Active Problem List   Diagnosis Date Noted   Adverse food reaction 01/10/2020   Pollen-food allergy 01/10/2020   Mild intermittent asthma without complication 016/38/4665  Allergic conjunctivitis of both eyes 01/10/2020   Other allergic rhinitis 01/10/2020   Herpes simplex type 1 infection 06/01/2019   Morbid obesity due to excess calories ([place] 05/02/2019   Iron deficiency 05/02/2019   Obesity (BMI 30-39.9) 08/19/2018   Pre-diabetes 02/18/2018   OSA (obstructive sleep apnea) 05/28/2017   Infection due to trichomonas 11/25/2016   Tinnitus aurium, 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   Family history of breast cancer 12/25/2015   Migraines 09/28/2013    Visit for preventive health examination 09/10/2013   GERD (gastroesophageal reflux disease) 03/19/2013   Seasonal allergies 03/19/2013   Neck pain, chronic 03/19/2013   IBS (irritable bowel syndrome) 03/19/2013    JCamillo FlamingDesenglau, PT 09/17/2020, 9:24 AM  [facility] [address] [place] [address]Phone: [phone]  Fax:  [phone] Name: Natalie DEANGELOMRN: [id number] of Birth: [birth year]

## 2020-09-20 DIAGNOSIS — H40023 Open angle with borderline findings, high risk, bilateral: Secondary | ICD-10-CM | POA: Diagnosis not present

## 2020-09-20 DIAGNOSIS — H40033 Anatomical narrow angle, bilateral: Secondary | ICD-10-CM | POA: Diagnosis not present

## 2020-09-24 ENCOUNTER — Encounter: Payer: Federal, State, Local not specified - PPO | Admitting: Physical Therapy

## 2020-10-01 ENCOUNTER — Encounter: Payer: Federal, State, Local not specified - PPO | Admitting: Physical Therapy

## 2020-10-08 ENCOUNTER — Encounter: Payer: Federal, State, Local not specified - PPO | Admitting: Physical Therapy

## 2020-10-15 ENCOUNTER — Ambulatory Visit: Payer: Federal, State, Local not specified - PPO | Admitting: Physical Therapy

## 2020-11-06 DIAGNOSIS — Z6833 Body mass index (BMI) 33.0-33.9, adult: Secondary | ICD-10-CM | POA: Diagnosis not present

## 2020-11-06 DIAGNOSIS — G4733 Obstructive sleep apnea (adult) (pediatric): Secondary | ICD-10-CM | POA: Diagnosis not present

## 2020-11-06 DIAGNOSIS — E559 Vitamin D deficiency, unspecified: Secondary | ICD-10-CM | POA: Diagnosis not present

## 2020-11-06 DIAGNOSIS — E669 Obesity, unspecified: Secondary | ICD-10-CM | POA: Diagnosis not present

## 2020-11-18 DIAGNOSIS — Z6833 Body mass index (BMI) 33.0-33.9, adult: Secondary | ICD-10-CM | POA: Diagnosis not present

## 2020-11-18 DIAGNOSIS — Z713 Dietary counseling and surveillance: Secondary | ICD-10-CM | POA: Diagnosis not present

## 2020-11-18 DIAGNOSIS — E669 Obesity, unspecified: Secondary | ICD-10-CM | POA: Diagnosis not present

## 2020-11-28 DIAGNOSIS — H40023 Open angle with borderline findings, high risk, bilateral: Secondary | ICD-10-CM | POA: Diagnosis not present

## 2020-11-28 DIAGNOSIS — H2513 Age-related nuclear cataract, bilateral: Secondary | ICD-10-CM | POA: Diagnosis not present

## 2020-11-28 DIAGNOSIS — H40033 Anatomical narrow angle, bilateral: Secondary | ICD-10-CM | POA: Diagnosis not present

## 2020-11-28 DIAGNOSIS — H1045 Other chronic allergic conjunctivitis: Secondary | ICD-10-CM | POA: Diagnosis not present

## 2020-12-13 ENCOUNTER — Encounter: Payer: Federal, State, Local not specified - PPO | Admitting: Physician Assistant

## 2020-12-25 ENCOUNTER — Encounter: Payer: Self-pay | Admitting: Physician Assistant

## 2020-12-26 NOTE — Telephone Encounter (Signed)
Thank you for this information, your chart has been updated.  Have a great day!

## 2021-01-01 ENCOUNTER — Encounter: Payer: Self-pay | Admitting: Physician Assistant

## 2021-01-01 ENCOUNTER — Other Ambulatory Visit: Payer: Self-pay

## 2021-01-01 ENCOUNTER — Ambulatory Visit (INDEPENDENT_AMBULATORY_CARE_PROVIDER_SITE_OTHER): Payer: Federal, State, Local not specified - PPO | Admitting: Physician Assistant

## 2021-01-01 VITALS — BP 128/81 | HR 87 | Temp 97.9°F | Ht 66.0 in | Wt 207.2 lb

## 2021-01-01 DIAGNOSIS — Z1231 Encounter for screening mammogram for malignant neoplasm of breast: Secondary | ICD-10-CM

## 2021-01-01 DIAGNOSIS — R7303 Prediabetes: Secondary | ICD-10-CM

## 2021-01-01 DIAGNOSIS — Z0000 Encounter for general adult medical examination without abnormal findings: Secondary | ICD-10-CM | POA: Diagnosis not present

## 2021-01-01 DIAGNOSIS — Z20828 Contact with and (suspected) exposure to other viral communicable diseases: Secondary | ICD-10-CM

## 2021-01-01 DIAGNOSIS — Z13220 Encounter for screening for lipoid disorders: Secondary | ICD-10-CM

## 2021-01-01 LAB — LIPID PANEL
Cholesterol: 147 mg/dL (ref 0–200)
HDL: 59.1 mg/dL (ref >39.00)
LDL Cholesterol: 74 mg/dL (ref 0–99)
NonHDL: 88.24
Total CHOL/HDL Ratio: 2
Triglycerides: 71 mg/dL (ref 0.0–149.0)
VLDL: 14.2 mg/dL (ref 0.0–40.0)

## 2021-01-01 LAB — COMPREHENSIVE METABOLIC PANEL
ALT: 12 U/L (ref 0–35)
AST: 18 U/L (ref 0–37)
Albumin: 4 g/dL (ref 3.5–5.2)
Alkaline Phosphatase: 57 U/L (ref 39–117)
BUN: 7 mg/dL (ref 6–23)
CO2: 29 mEq/L (ref 19–32)
Calcium: 9.5 mg/dL (ref 8.4–10.5)
Chloride: 101 mEq/L (ref 96–112)
Creatinine, Ser: 0.82 mg/dL (ref 0.40–1.20)
GFR: 78.78 mL/min (ref >60.00)
Glucose, Bld: 104 mg/dL — ABNORMAL HIGH (ref 70–99)
Potassium: 4.1 mEq/L (ref 3.5–5.1)
Sodium: 136 mEq/L (ref 135–145)
Total Bilirubin: 0.2 mg/dL (ref 0.2–1.2)
Total Protein: 8.2 g/dL (ref 6.0–8.3)

## 2021-01-01 LAB — CBC WITH DIFFERENTIAL/PLATELET
Basophils Absolute: 0 10*3/uL (ref 0.0–0.1)
Basophils Relative: 0.3 % (ref 0.0–3.0)
Eosinophils Absolute: 0.1 10*3/uL (ref 0.0–0.7)
Eosinophils Relative: 1.9 % (ref 0.0–5.0)
HCT: 39 % (ref 36.0–46.0)
Hemoglobin: 12.6 g/dL (ref 12.0–15.0)
Lymphocytes Relative: 48.6 % — ABNORMAL HIGH (ref 12.0–46.0)
Lymphs Abs: 2.5 10*3/uL (ref 0.7–4.0)
MCHC: 32.3 g/dL (ref 30.0–36.0)
MCV: 79.6 fl (ref 78.0–100.0)
Monocytes Absolute: 0.3 10*3/uL (ref 0.1–1.0)
Monocytes Relative: 6.3 % (ref 3.0–12.0)
Neutro Abs: 2.2 10*3/uL (ref 1.4–7.7)
Neutrophils Relative %: 42.9 % — ABNORMAL LOW (ref 43.0–77.0)
Platelets: 282 10*3/uL (ref 150.0–400.0)
RBC: 4.89 Mil/uL (ref 3.87–5.11)
RDW: 14.5 % (ref 11.5–15.5)
WBC: 5.2 10*3/uL (ref 4.0–10.5)

## 2021-01-01 LAB — HEMOGLOBIN A1C: Hgb A1c MFr Bld: 6.4 % (ref 4.6–6.5)

## 2021-01-01 MED ORDER — OSELTAMIVIR PHOSPHATE 75 MG PO CAPS: 75.0000 mg | ORAL_CAPSULE | Freq: Every day | ORAL | 0 refills | Status: AC

## 2021-01-01 NOTE — Progress Notes (Signed)
Subjective:    Patient ID: Natalie Vazquez, female    DOB: 19-May-1960, 60 y.o.   MRN: [id number]  Chief Complaint  Patient presents with   Annual Exam    HPI Patient is in today for annual exam.  Acute concerns: Granddaughter with influenza this week, lives with her. Pt has not had flu shot yet and worried about getting the flu.   Health maintenance: Substance use: None Immunizations: Due for 2nd shingrix Colonoscopy: UTD Pap: Hx hysterectomy Mammogram: She is due this year.    Past Medical History:  Diagnosis Date   Allergic rhinitis due to other allergen    Allergy    seasonal   Anemia    Arthritis    Asthma    mild, intermittent   Chicken pox    Cholelithiasis    CTS (carpal tunnel syndrome)    Diabetes mellitus without complication (HCC)    Fibroids    Fibromyalgia    Frequent urination    GERD (gastroesophageal reflux disease)    Headache(784.0)    Migraine    PONV (postoperative nausea and vomiting)    difficulty waking up   Problems related to high-risk sexual behavior    Unprotected sex   Symptomatic menopausal or female climacteric states     Past Surgical History:  Procedure Laterality Date   ABDOMINAL HYSTERECTOMY  2008   partial, ovaries remain   CARPAL TUNNEL RELEASE Right 2015   CHOLECYSTECTOMY  05/19/2011   Procedure: LAPAROSCOPIC CHOLECYSTECTOMY;  Surgeon: Zenovia Jarred, MD;  Location: [facility];  Service: General;  Laterality: N/A;   LASER ABLATION [place] / VULVAR     PERCUTANEOUS PINNING  01/22/2012   Procedure: PERCUTANEOUS PINNING EXTREMITY;  Surgeon: Marybelle Killings, MD;  Location: [facility];  Service: Orthopedics;  Laterality: Right;  Closed Reduction and Pinning right 5th Metacarpal Fracture   TUBAL LIGATION     WISDOM TOOTH EXTRACTION      Family History  Problem Relation Age of Onset   Diabetes Mother        Deceased   Hypertension Mother    Anemia Mother    Hyperlipidemia Mother    Arthritis/Rheumatoid Mother     Migraines Mother    CVA Mother    Hypertension Father        Deceased   Stroke Father    Diabetes Father        Legs Amputated   Heart disease Father    Arthritis Father    Cancer Father    Heart attack Maternal Aunt    Diabetes Maternal Uncle    Migraines Maternal Aunt    Asthma Maternal Aunt    Asthma Maternal Uncle    Stomach cancer Sister    Breast cancer Sister    Uterine cancer Sister        or Ovarian   Sleep apnea Brother    COPD Brother    Asthma Sister        x2   Sudden death Brother        SIDS   Hypertension Brother    Diabetes Sister    Asthma Daughter        x2   Allergies Daughter    Sickle cell trait Other        All Children   Atrial fibrillation Daughter    Anxiety disorder Daughter    Allergies Son    Hypertension Sister    Heart disease Sister    Asthma Brother  Social History   Tobacco Use   Smoking status: Never   Smokeless tobacco: Never  Vaping Use   Vaping Use: Never used  Substance Use Topics   Alcohol use: Yes    Comment: rarely    Drug use: No     Allergies  Allergen Reactions   Iodine Anaphylaxis   Shellfish Allergy Anaphylaxis   Latex Rash    Review of Systems  Constitutional:  Negative for activity change, appetite change, fever and unexpected weight change.  HENT:  Negative for congestion.   Eyes:  Negative for visual disturbance.  Respiratory:  Negative for apnea, cough and shortness of breath.   Cardiovascular:  Negative for chest pain, palpitations and leg swelling.  Gastrointestinal:  Negative for abdominal pain, blood in stool, constipation and diarrhea.  Endocrine: Negative for polydipsia, polyphagia and polyuria.  Genitourinary:  Negative for dysuria and pelvic pain.  Musculoskeletal:  Negative for arthralgias.  Skin:  Negative for rash.  Neurological:  Negative for dizziness, weakness and headaches.  Hematological:  Negative for adenopathy. Does not bruise/bleed easily.  Psychiatric/Behavioral:   Negative for sleep disturbance and suicidal ideas. The patient is not nervous/anxious.        Objective:     BP 128/81   Pulse 87   Temp 97.9 F (36.6 C)   Ht 5\' 6"  (1.676 m)   Wt 207 lb 3.2 oz (94 kg)   SpO2 98%   BMI 33.44 kg/m   Wt Readings from Last 3 Encounters:  01/01/21 207 lb 3.2 oz (94 kg)  07/18/20 206 lb 8 oz (93.7 kg)  06/10/20 204 lb (92.5 kg)    BP Readings from Last 3 Encounters:  01/01/21 128/81  07/18/20 134/76  06/10/20 125/81     Physical Exam Vitals and nursing note reviewed. Exam conducted with a chaperone present.  Constitutional:      Appearance: Normal appearance. She is normal weight. She is not toxic-appearing.  HENT:     Head: Normocephalic and atraumatic.     Right Ear: Tympanic membrane, ear canal and external ear normal.     Left Ear: Tympanic membrane, ear canal and external ear normal.     Nose: Nose normal.     Mouth/Throat:     Mouth: Mucous membranes are moist.  Eyes:     Extraocular Movements: Extraocular movements intact.     Conjunctiva/sclera: Conjunctivae normal.     Pupils: Pupils are equal, round, and reactive to light.  Cardiovascular:     Rate and Rhythm: Normal rate and regular rhythm.     Pulses: Normal pulses.     Heart sounds: Normal heart sounds.  Pulmonary:     Effort: Pulmonary effort is normal.     Breath sounds: Normal breath sounds.  Chest:  Breasts:    Right: Normal. No mass, nipple discharge or tenderness.     Left: Normal. No mass, nipple discharge or tenderness.  Abdominal:     General: Abdomen is flat. Bowel sounds are normal.     Palpations: Abdomen is soft.  Genitourinary:    Labia:        Right: No lesion.        Left: No lesion.      Vagina: Normal.  Musculoskeletal:        General: Normal range of motion.     Cervical back: Normal range of motion and neck supple.  Lymphadenopathy:     Upper Body:     Right upper body: No supraclavicular, axillary  or pectoral adenopathy.     Left upper  body: No supraclavicular, axillary or pectoral adenopathy.     Lower Body: No right inguinal adenopathy. No left inguinal adenopathy.  Skin:    General: Skin is warm and dry.  Neurological:     General: No focal deficit present.     Mental Status: She is alert and oriented to person, place, and time.  Psychiatric:        Mood and Affect: Mood normal.        Behavior: Behavior normal.        Thought Content: Thought content normal.        Judgment: Judgment normal.       Assessment & Plan:   Problem List Items Addressed This Visit       Other   Pre-diabetes   Relevant Orders   Comprehensive metabolic panel   Hemoglobin A1c   Other Visit Diagnoses     Encounter for annual physical exam    -  Primary   Relevant Orders   CBC with Differential/Platelet   Comprehensive metabolic panel   Lipid panel   Hemoglobin A1c   MM 3D SCREEN BREAST BILATERAL   Screening for cholesterol level       Relevant Orders   Lipid panel   Breast cancer screening by mammogram       Relevant Orders   MM 3D SCREEN BREAST BILATERAL   Exposure to influenza            Meds ordered this encounter  Medications   oseltamivir (TAMIFLU) 75 MG capsule    Sig: Take 1 capsule (75 mg total) by mouth daily for 10 days. For influenza prevention.    Dispense:  10 capsule    Refill:  0    Age-appropriate screening and counseling performed today. Will check labs and call with results. Preventive measures discussed and printed in AVS for patient. Family hx of breast, stomach, uterine cancers - pt states she had genetic testing done years ago with GYN and everything was negative. She stays UTD with preventive measures. Due for mammogram this year. Pelvic exam normal today with exception of minor bladder prolapse. Encouraged healthy diet and exercise as she is able. Also Rx'd Tamiflu for prevention due to close household contact in 48 hours.    Autry Prust M Khristin Keleher, PA-C

## 2021-01-01 NOTE — Patient Instructions (Addendum)
Good to see you today! You look great!  Please go to the lab for blood work and I will send results through [facility]. Order placed for screening mammogram. Second Shingrix at [facility] - ok to do this in the next month now. Take the Tamiflu once daily for 10 days for flu prevention.   Call if any concerns.

## 2021-01-08 ENCOUNTER — Telehealth: Payer: Self-pay

## 2021-01-08 DIAGNOSIS — R7303 Prediabetes: Secondary | ICD-10-CM | POA: Diagnosis not present

## 2021-01-08 DIAGNOSIS — Z6834 Body mass index (BMI) 34.0-34.9, adult: Secondary | ICD-10-CM | POA: Diagnosis not present

## 2021-01-08 DIAGNOSIS — G4733 Obstructive sleep apnea (adult) (pediatric): Secondary | ICD-10-CM | POA: Diagnosis not present

## 2021-01-08 DIAGNOSIS — E669 Obesity, unspecified: Secondary | ICD-10-CM | POA: Diagnosis not present

## 2021-01-08 NOTE — Telephone Encounter (Signed)
Patient is returning a phone call to [facility] about lab results.

## 2021-01-08 NOTE — Telephone Encounter (Signed)
See result notes. 

## 2021-01-15 ENCOUNTER — Ambulatory Visit: Payer: Federal, State, Local not specified - PPO | Admitting: Allergy

## 2021-01-22 ENCOUNTER — Encounter: Payer: Self-pay | Admitting: Allergy

## 2021-01-22 ENCOUNTER — Ambulatory Visit: Payer: Federal, State, Local not specified - PPO | Admitting: Allergy

## 2021-01-22 ENCOUNTER — Other Ambulatory Visit: Payer: Self-pay

## 2021-01-22 VITALS — BP 126/80 | HR 56 | Temp 97.8°F | Resp 16 | Ht 66.0 in | Wt 207.0 lb

## 2021-01-22 DIAGNOSIS — R519 Headache, unspecified: Secondary | ICD-10-CM | POA: Diagnosis not present

## 2021-01-22 DIAGNOSIS — J4520 Mild intermittent asthma, uncomplicated: Secondary | ICD-10-CM

## 2021-01-22 DIAGNOSIS — J302 Other seasonal allergic rhinitis: Secondary | ICD-10-CM

## 2021-01-22 DIAGNOSIS — T781XXD Other adverse food reactions, not elsewhere classified, subsequent encounter: Secondary | ICD-10-CM

## 2021-01-22 DIAGNOSIS — H1010 Acute atopic conjunctivitis, unspecified eye: Secondary | ICD-10-CM

## 2021-01-22 DIAGNOSIS — J3089 Other allergic rhinitis: Secondary | ICD-10-CM

## 2021-01-22 DIAGNOSIS — H1013 Acute atopic conjunctivitis, bilateral: Secondary | ICD-10-CM

## 2021-01-22 MED ORDER — EPINEPHRINE 0.3 MG/0.3ML IJ SOAJ: 0.3000 mg | INTRAMUSCULAR | 1 refills | Status: DC | PRN

## 2021-01-22 NOTE — Progress Notes (Signed)
Follow Up Note  RE: Natalie Vazquez MRN: [id number] DOB: 13-Jan-1961 Date of Office Visit: 01/22/2021  Referring provider: Delorse Limber Primary care provider: Allwardt, Randa Evens, PA-C  Chief Complaint: Food Intolerance  History of Present Illness: I had the pleasure of seeing Natalie Vazquez for a follow up visit at [facility] on 01/22/2021. She is a 60 y.o. female, who is being followed for adverse food reaction, oral allergy syndrome, allergic rhinoconjunctivitis, asthma. Her previous allergy office visit was on 01/10/2020 with Dr. Maudie Mercury. Today is a regular follow up visit.  Food allergy Currently avoiding shellfish, some fruits, milk. No reactions since the last visit.    Allergic rhino conjunctivitis Uses pataday as needed for itchy eyes and zyrtec prn with good benefit.    Mild intermittent asthma Had cough about 1 month ago but it resolved. Used albuterol with some benefit.  Denies any ER/urgent care visits or prednisone use since the last visit. Patient has some reflux - triggers include tomatoes.   Assessment and Plan: Natalie Vazquez is a 60 y.o. female with: Food allergy Past history - Allergic reactions to shrimp at the age of 46 in the form of throat tightness and itchy throat.  She was evaluated in the ER and received epinephrine with good benefit.  Fish causes vomiting.  Fresh peaches cause perioral pruritus - tolerates processed forms with no issues.  Grapes cause headaches.  Dairy causes abdominal bloating and constipation.  2021 skin testing showed: Positive to shellfish and borderline positive to mollusks. Negative to all other foods.  Interim history - no reactions.  Continue strict avoidance of shellfish and fish. Avoid other foods that are bothersome - grapes, dairy, fresh peaches. For mild symptoms you can take over the counter antihistamines such as Benadryl and monitor symptoms closely. If symptoms worsen or if you have severe symptoms  including breathing issues, throat closure, significant swelling, whole body hives, severe diarrhea and vomiting, lightheadedness then inject epinephrine and seek immediate medical care afterwards. Food action plan in place.   Pollen-food allergy See assessment and plan as above.  Seasonal and perennial allergic rhinoconjunctivitis Past history - Rhinoconjunctivitis symptoms for 20+ years mainly from spring through fall.  Used Zyrtec and Pataday with good benefit.  Declines nasal sprays.  Skin testing over 20 years ago showed positive to ragweed, roaches, dog, cat and dust per patient report.  2021 skin testing showed: Positive to grass, weed, trees, dust mites, cat, cockroach.  Interim history - controlled. Continue environmental control measures as below. Use over the counter antihistamines such as Zyrtec (cetirizine), Claritin (loratadine), Allegra (fexofenadine), or Xyzal (levocetirizine) daily as needed. May switch antihistamines every few months. May use olopatadine eye drops 0.2% once a day as needed for itchy/watery eyes.  Mild intermittent asthma without complication Past history - Chest tightness when she gets overheated or overly excited.  Uses Xopenex with good benefit.  Interim history - cough 1 month ago and used albuterol with good benefit. Today's spirometry was normal. May use albuterol rescue inhaler 2 puffs every 4 to 6 hours as needed for shortness of breath, chest tightness, coughing, and wheezing. May use albuterol rescue inhaler 2 puffs 5 to 15 minutes prior to strenuous physical activities. Monitor frequency of use.   Generalized headaches Increased headaches - no triggers noted. Monitor symptoms. If not improving please follow up with your PCP.   Return in about 1 year (around 01/22/2022).  Meds ordered this encounter  Medications  EPINEPHrine 0.3 mg/0.3 mL IJ SOAJ injection    Sig: Inject 0.3 mg into the muscle as needed for anaphylaxis.    Dispense:  2 each     Refill:  1    May dispense generic/Mylan/Teva brand.    Lab Orders  No laboratory test(s) ordered today    Diagnostics: Spirometry:  Tracings reviewed. Her effort: Good reproducible efforts. FVC: 3.16L FEV1: 2.65L, 114% predicted FEV1/FVC ratio: 84% Interpretation: Spirometry consistent with normal pattern.  Please see scanned spirometry results for details.  Medication List:  Current Outpatient Medications  Medication Sig Dispense Refill   Bee Pollen 1000 MG TABS Take 1 tablet by mouth.     Biotin 1000 MCG tablet Take 1,000 mcg by mouth daily. Three times a week     cetirizine (ZYRTEC ALLERGY) 10 MG tablet Take 1 tablet (10 mg total) by mouth daily. 30 tablet 11   Cholecalciferol (VITAMIN D-3) 25 MCG (1000 UT) CAPS Take 1 capsule (1,000 Units total) by mouth daily. 30 capsule 5   CONTRAVE 8-90 MG TB12 Take 2 tablets by mouth 2 (two) times daily.     cyclobenzaprine (FLEXERIL) 10 MG tablet 1 tablet PO QHS PRN 30 tablet 0   EPINEPHrine 0.3 mg/0.3 mL IJ SOAJ injection Inject 0.3 mg into the muscle as needed for anaphylaxis. 2 each 1   levalbuterol (XOPENEX HFA) 45 MCG/ACT inhaler INHALE 1 TO 2 PUFFS BY MOUTH EVERY 6 HOURS AS NEEDED FOR WHEEZING 15 g 5   NON FORMULARY Take 3 tablets by mouth as needed. Swiss Kriss Herbal Laxative Tab     Olopatadine HCl 0.2 % SOLN Apply 1 drop to eye daily as needed (itchy/watery eyes). 2.5 mL 11   pantoprazole (PROTONIX) 20 MG tablet Take by mouth.     SUMAtriptan (IMITREX) 100 MG tablet Take one tablet by mouth onset of headache, May repeat in 2 hours if headache persists or recurs. 10 tablet 0   Turmeric 500 MG CAPS Take 1 tablet by mouth daily.     vitamin E 180 MG (400 UNITS) capsule Take 1 capsule by mouth daily.     zonisamide (ZONEGRAN) 25 MG capsule Take 1 capsule by mouth daily.  5   No current facility-administered medications for this visit.   Allergies: Allergies  Allergen Reactions   Iodine Anaphylaxis   Shellfish Allergy  Anaphylaxis   Latex Rash   I reviewed her past medical history, social history, family history, and environmental history and no significant changes have been reported from her previous visit.  Review of Systems  Constitutional:  Negative for appetite change, chills, fever and unexpected weight change.  HENT:  Negative for congestion and rhinorrhea.   Eyes:  Negative for itching.  Respiratory:  Negative for cough, chest tightness, shortness of breath and wheezing.   Cardiovascular:  Negative for chest pain.  Gastrointestinal:  Negative for abdominal pain.  Genitourinary:  Negative for difficulty urinating.  Skin:  Negative for rash.  Allergic/Immunologic: Positive for environmental allergies and food allergies.  Neurological:  Positive for headaches.   Objective: BP 126/80   Pulse (!) 56   Temp 97.8 F (36.6 C) (Temporal)   Resp 16   Ht 5\' 6"  (1.676 m)   Wt 207 lb (93.9 kg)   SpO2 100%   BMI 33.41 kg/m  Body mass index is 33.41 kg/m. Physical Exam Vitals and nursing note reviewed.  Constitutional:      Appearance: Normal appearance. She is well-developed.  HENT:  Head: Normocephalic and atraumatic.     Right Ear: External ear normal.     Left Ear: External ear normal.     Nose: Nose normal.     Mouth/Throat:     Mouth: Mucous membranes are moist.     Pharynx: Oropharynx is clear.  Eyes:     Conjunctiva/sclera: Conjunctivae normal.  Cardiovascular:     Rate and Rhythm: Normal rate and regular rhythm.     Heart sounds: Normal heart sounds. No murmur heard.   No friction rub. No gallop.  Pulmonary:     Effort: Pulmonary effort is normal.     Breath sounds: Normal breath sounds. No wheezing, rhonchi or rales.  Musculoskeletal:     Cervical back: Neck supple.  Skin:    General: Skin is warm and dry.     Findings: No rash.  Neurological:     Mental Status: She is alert and oriented to person, place, and time.  Psychiatric:        Behavior: Behavior normal.    Previous notes and tests were reviewed. The plan was reviewed with the patient/family, and all questions/concerned were addressed.  It was my pleasure to see Natalie Vazquez today and participate in her care. Please feel free to contact me with any questions or concerns.  Sincerely,  Rexene Alberts, DO Allergy & Immunology  [facility] office: [place] office: [phone]

## 2021-01-22 NOTE — Patient Instructions (Addendum)
Food:  2021 skin testing showed: Positive to shellfish and borderline positive to mollusks Continue strict avoidance of shellfish and fish. Avoid other foods that are bothersome - grapes, dairy, fresh peaches. For mild symptoms you can take over the counter antihistamines such as Benadryl and monitor symptoms closely. If symptoms worsen or if you have severe symptoms including breathing issues, throat closure, significant swelling, whole body hives, severe diarrhea and vomiting, lightheadedness then inject epinephrine and seek immediate medical care afterwards. Food action plan in place.   Environmental allergies 2021 skin testing showed: Positive to grass, weed, trees, dust mites, cat, cockroach.  Continue environmental control measures as below. Use over the counter antihistamines such as Zyrtec (cetirizine), Claritin (loratadine), Allegra (fexofenadine), or Xyzal (levocetirizine) daily as needed. May switch antihistamines every few months. May use olopatadine eye drops 0.2% once a day as needed for itchy/watery eyes.  Skin: See below for proper skin care.   Asthma:  Normal breathing test today.  May use albuterol rescue inhaler 2 puffs every 4 to 6 hours as needed for shortness of breath, chest tightness, coughing, and wheezing. May use albuterol rescue inhaler 2 puffs 5 to 15 minutes prior to strenuous physical activities. Monitor frequency of use.   Headaches: Monitor symptoms. If not improving please follow up with your PCP.   Follow up in 12 months or sooner if needed.   Reducing Pollen Exposure Pollen seasons: trees (spring), grass (summer) and ragweed/weeds (fall). Keep windows closed in your home and car to lower pollen exposure.  Install air conditioning in the bedroom and throughout the house if possible.  Avoid going out in dry windy days - especially early morning. Pollen counts are highest between 5 - 10 AM and on dry, hot and windy days.  Save outside activities for  late afternoon or after a heavy rain, when pollen levels are lower.  Avoid mowing of grass if you have grass pollen allergy. Be aware that pollen can also be transported indoors on people and pets.  Dry your clothes in an automatic dryer rather than hanging them outside where they might collect pollen.  Rinse hair and eyes before bedtime. Control of House Dust Mite Allergen Dust mite allergens are a common trigger of allergy and asthma symptoms. While they can be found throughout the house, these microscopic creatures thrive in warm, humid environments such as bedding, upholstered furniture and carpeting. Because so much time is spent in the bedroom, it is essential to reduce mite levels there.  Encase pillows, mattresses, and box springs in special allergen-proof fabric covers or airtight, zippered plastic covers.  Bedding should be washed weekly in hot water (130 F) and dried in a hot dryer. Allergen-proof covers are available for comforters and pillows that can't be regularly washed.  Wash the allergy-proof covers every few months. Minimize clutter in the bedroom. Keep pets out of the bedroom.  Keep humidity less than 50% by using a dehumidifier or air conditioning. You can buy a humidity measuring device called a hygrometer to monitor this.  If possible, replace carpets with hardwood, linoleum, or washable area rugs. If that's not possible, vacuum frequently with a vacuum that has a HEPA filter. Remove all upholstered furniture and non-washable window drapes from the bedroom. Remove all non-washable stuffed toys from the bedroom.  Wash stuffed toys weekly. Pet Allergen Avoidance: Contrary to popular opinion, there are no "hypoallergenic" breeds of dogs or cats. That is because people are not allergic to an animal's hair, but to an allergen  found in the animal's saliva, dander (dead skin flakes) or urine. Pet allergy symptoms typically occur within minutes. For some people, symptoms can build up  and become most severe 8 to 12 hours after contact with the animal. People with severe allergies can experience reactions in public places if dander has been transported on the pet owners' clothing. Keeping an animal outdoors is only a partial solution, since homes with pets in the yard still have higher concentrations of animal allergens. Before getting a pet, ask your allergist to determine if you are allergic to animals. If your pet is already considered part of your family, try to minimize contact and keep the pet out of the bedroom and other rooms where you spend a great deal of time. As with dust mites, vacuum carpets often or replace carpet with a hardwood floor, tile or linoleum. High-efficiency particulate air (HEPA) cleaners can reduce allergen levels over time. While dander and saliva are the source of cat and dog allergens, urine is the source of allergens from rabbits, hamsters, mice and [place] pigs; so ask a non-allergic family member to clean the animal's cage. If you have a pet allergy, talk to your allergist about the potential for allergy immunotherapy (allergy shots). This strategy can often provide long-term relief. Cockroach Allergen Avoidance Cockroaches are often found in the homes of densely populated urban areas, schools or commercial buildings, but these creatures can lurk almost anywhere. This does not mean that you have a dirty house or living area. Block all areas where roaches can enter the home. This includes crevices, wall cracks and windows.  Cockroaches need water to survive, so fix and seal all leaky faucets and pipes. Have an exterminator go through the house when your family and pets are gone to eliminate any remaining roaches. Keep food in lidded containers and put pet food dishes away after your pets are done eating. Vacuum and sweep the floor after meals, and take out garbage and recyclables. Use lidded garbage containers in the kitchen. Wash dishes immediately  after use and clean under stoves, refrigerators or toasters where crumbs can accumulate. Wipe off the stove and other kitchen surfaces and cupboards regularly.    Skin care recommendations  Bath time: Always use lukewarm water. AVOID very hot or cold water. Keep bathing time to 5-10 minutes. Do NOT use bubble bath. Use a mild soap and use just enough to wash the dirty areas. Do NOT scrub skin vigorously.  After bathing, pat dry your skin with a towel. Do NOT rub or scrub the skin.  Moisturizers and prescriptions:  ALWAYS apply moisturizers immediately after bathing (within 3 minutes). This helps to lock-in moisture. Use the moisturizer several times a day over the whole body. Good summer moisturizers include: Aveeno, CeraVe, Cetaphil. Good winter moisturizers include: Aquaphor, Vaseline, Cerave, Cetaphil, Eucerin, Vanicream. When using moisturizers along with medications, the moisturizer should be applied about one hour after applying the medication to prevent diluting effect of the medication or moisturize around where you applied the medications. When not using medications, the moisturizer can be continued twice daily as maintenance.  Laundry and clothing: Avoid laundry products with added color or perfumes. Use unscented hypo-allergenic laundry products such as Tide free, Cheer free & gentle, and All free and clear.  If the skin still seems dry or sensitive, you can try double-rinsing the clothes. Avoid tight or scratchy clothing such as wool. Do not use fabric softeners or dyer sheets.

## 2021-01-22 NOTE — Assessment & Plan Note (Signed)
.   See assessment and plan as above. 

## 2021-01-22 NOTE — Assessment & Plan Note (Signed)
Past history - Chest tightness when she gets overheated or overly excited.  Uses Xopenex with good benefit.  Interim history - cough 1 month ago and used albuterol with good benefit.  Today's spirometry was normal.  May use albuterol rescue inhaler 2 puffs every 4 to 6 hours as needed for shortness of breath, chest tightness, coughing, and wheezing. May use albuterol rescue inhaler 2 puffs 5 to 15 minutes prior to strenuous physical activities. Monitor frequency of use.

## 2021-01-22 NOTE — Assessment & Plan Note (Signed)
Past history - Rhinoconjunctivitis symptoms for 20+ years mainly from spring through fall.  Used Zyrtec and Pataday with good benefit.  Declines nasal sprays.  Skin testing over 20 years ago showed positive to ragweed, roaches, dog, cat and dust per patient report.  2021 skin testing showed: Positive to grass, weed, trees, dust mites, cat, cockroach.  Interim history - controlled.  Continue environmental control measures as below. . Use over the counter antihistamines such as Zyrtec (cetirizine), Claritin (loratadine), Allegra (fexofenadine), or Xyzal (levocetirizine) daily as needed. May switch antihistamines every few months.  May use olopatadine eye drops 0.2% once a day as needed for itchy/watery eyes.

## 2021-01-22 NOTE — Assessment & Plan Note (Signed)
Increased headaches - no triggers noted. . Monitor symptoms. If not improving please follow up with your PCP.

## 2021-01-22 NOTE — Assessment & Plan Note (Signed)
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  She was evaluated in the ER and received epinephrine with good benefit.  Fish causes vomiting.  Fresh peaches cause perioral pruritus - tolerates processed forms with no issues.  Grapes cause headaches.  Dairy causes abdominal bloating and constipation.  2021 skin testing showed: Positive to shellfish and borderline positive to mollusks. Negative to all other foods.  Interim history - no reactions.   Continue strict avoidance of shellfish and fish.  Avoid other foods that are bothersome - grapes, dairy, fresh peaches.  For mild symptoms you can take over the counter antihistamines such as Benadryl and monitor symptoms closely. If symptoms worsen or if you have severe symptoms including breathing issues, throat closure, significant swelling, whole body hives, severe diarrhea and vomiting, lightheadedness then inject epinephrine and seek immediate medical care afterwards.  Food action plan in place.

## 2021-01-24 DIAGNOSIS — Z6833 Body mass index (BMI) 33.0-33.9, adult: Secondary | ICD-10-CM | POA: Diagnosis not present

## 2021-01-24 DIAGNOSIS — Z713 Dietary counseling and surveillance: Secondary | ICD-10-CM | POA: Diagnosis not present

## 2021-01-24 DIAGNOSIS — Z6834 Body mass index (BMI) 34.0-34.9, adult: Secondary | ICD-10-CM | POA: Diagnosis not present

## 2021-01-24 DIAGNOSIS — E669 Obesity, unspecified: Secondary | ICD-10-CM | POA: Diagnosis not present

## 2021-01-25 ENCOUNTER — Ambulatory Visit
Admission: RE | Admit: 2021-01-25 | Discharge: 2021-01-25 | Disposition: A | Discharge status: Home / Self Care | Payer: Federal, State, Local not specified - PPO | Source: Ambulatory Visit | Attending: Physician Assistant | Admitting: Physician Assistant

## 2021-01-25 DIAGNOSIS — Z1231 Encounter for screening mammogram for malignant neoplasm of breast: Secondary | ICD-10-CM | POA: Diagnosis not present

## 2021-01-25 DIAGNOSIS — Z0000 Encounter for general adult medical examination without abnormal findings: Secondary | ICD-10-CM

## 2021-02-12 DIAGNOSIS — R519 Headache, unspecified: Secondary | ICD-10-CM | POA: Diagnosis not present

## 2021-02-12 DIAGNOSIS — G43019 Migraine without aura, intractable, without status migrainosus: Secondary | ICD-10-CM | POA: Diagnosis not present

## 2021-02-14 ENCOUNTER — Ambulatory Visit: Payer: Federal, State, Local not specified - PPO

## 2021-02-19 ENCOUNTER — Ambulatory Visit (HOSPITAL_COMMUNITY)
Admission: EM | Admit: 2021-02-19 | Discharge: 2021-02-19 | Disposition: A | Discharge status: Home / Self Care | Payer: Federal, State, Local not specified - PPO

## 2021-02-19 ENCOUNTER — Other Ambulatory Visit: Payer: Self-pay

## 2021-02-19 DIAGNOSIS — E669 Obesity, unspecified: Secondary | ICD-10-CM | POA: Diagnosis not present

## 2021-02-19 DIAGNOSIS — J0100 Acute maxillary sinusitis, unspecified: Secondary | ICD-10-CM | POA: Diagnosis not present

## 2021-02-19 DIAGNOSIS — Z131 Encounter for screening for diabetes mellitus: Secondary | ICD-10-CM | POA: Diagnosis not present

## 2021-03-11 NOTE — Progress Notes (Signed)
Tried calling patient. No answer, unable to leave message, no voicemail.

## 2021-05-07 DIAGNOSIS — J069 Acute upper respiratory infection, unspecified: Secondary | ICD-10-CM | POA: Diagnosis not present

## 2021-05-12 ENCOUNTER — Other Ambulatory Visit: Payer: Self-pay

## 2021-05-12 ENCOUNTER — Ambulatory Visit: Payer: Federal, State, Local not specified - PPO | Admitting: Physician Assistant

## 2021-05-12 ENCOUNTER — Encounter: Payer: Self-pay | Admitting: Physician Assistant

## 2021-05-12 VITALS — BP 127/83 | HR 74 | Temp 98.0°F | Ht 66.0 in | Wt 215.2 lb

## 2021-05-12 DIAGNOSIS — J069 Acute upper respiratory infection, unspecified: Secondary | ICD-10-CM | POA: Diagnosis not present

## 2021-05-12 NOTE — Progress Notes (Signed)
Subjective:    Patient ID: Natalie Vazquez, female    DOB: [birth year], 61 y.o.   MRN: [id number]  Chief Complaint  Patient presents with   Hoarse    HPI Patient is in today for hoarseness.  She went to [facility] last week for URI symptoms. She tested negative for COVID-19. She finished antibiotic (Z-pak) course last night. HA and coughing has resolved. States she is feeling much better today and needs a note for work allowing her to return today.   Past Medical History:  Diagnosis Date   Allergic rhinitis due to other allergen    Allergy    seasonal   Anemia    Arthritis    Asthma    mild, intermittent   Chicken pox    Cholelithiasis    CTS (carpal tunnel syndrome)    Diabetes mellitus without complication (HCC)    Fibroids    Fibromyalgia    Frequent urination    GERD (gastroesophageal reflux disease)    Headache(784.0)    Migraine    PONV (postoperative nausea and vomiting)    difficulty waking up   Problems related to high-risk sexual behavior    Unprotected sex   Symptomatic menopausal or female climacteric states     Past Surgical History:  Procedure Laterality Date   ABDOMINAL HYSTERECTOMY  2008   partial, ovaries remain   CARPAL TUNNEL RELEASE Right 2015   CHOLECYSTECTOMY  05/19/2011   Procedure: LAPAROSCOPIC CHOLECYSTECTOMY;  Surgeon: Zenovia Jarred, MD;  Location: [place];  Service: General;  Laterality: N/A;   LASER ABLATION [place] / VULVAR     PERCUTANEOUS PINNING  01/22/2012   Procedure: PERCUTANEOUS PINNING EXTREMITY;  Surgeon: Marybelle Killings, MD;  Location: [place];  Service: Orthopedics;  Laterality: Right;  Closed Reduction and Pinning right 5th Metacarpal Fracture   TUBAL LIGATION     WISDOM TOOTH EXTRACTION      Family History  Problem Relation Age of Onset   Diabetes Mother        Deceased   Hypertension Mother    Anemia Mother    Hyperlipidemia Mother    Arthritis/Rheumatoid Mother    Migraines Mother    CVA Mother     Hypertension Father        Deceased   Stroke Father    Diabetes Father        Legs Amputated   Heart disease Father    Arthritis Father    Cancer Father    Heart attack Maternal Aunt    Diabetes Maternal Uncle    Migraines Maternal Aunt    Asthma Maternal Aunt    Asthma Maternal Uncle    Stomach cancer Sister    Breast cancer Sister    Uterine cancer Sister        or Ovarian   Sleep apnea Brother    COPD Brother    Asthma Sister        x2   Sudden death Brother        SIDS   Hypertension Brother    Diabetes Sister    Asthma Daughter        x2   Allergies Daughter    Sickle cell trait Other        All Children   Atrial fibrillation Daughter    Anxiety disorder Daughter    Allergies Son    Hypertension Sister    Heart disease Sister    Asthma Brother  Social History   Tobacco Use   Smoking status: Never   Smokeless tobacco: Never  Vaping Use   Vaping Use: Never used  Substance Use Topics   Alcohol use: Yes    Comment: rarely    Drug use: No     Allergies  Allergen Reactions   Iodine Anaphylaxis   Shellfish Allergy Anaphylaxis   Latex Rash    Review of Systems NEGATIVE UNLESS OTHERWISE INDICATED IN HPI      Objective:     BP 127/83    Pulse 74    Temp 98 F (36.7 C)    Ht 5\' 6"  (1.676 m)    Wt 215 lb 3.2 oz (97.6 kg)    SpO2 98%    BMI 34.73 kg/m   Wt Readings from Last 3 Encounters:  05/12/21 215 lb 3.2 oz (97.6 kg)  01/22/21 207 lb (93.9 kg)  01/01/21 207 lb 3.2 oz (94 kg)    BP Readings from Last 3 Encounters:  05/12/21 127/83  01/22/21 126/80  01/01/21 128/81     Physical Exam Vitals and nursing note reviewed.  Constitutional:      Appearance: Normal appearance. She is normal weight. She is not toxic-appearing.  HENT:     Head: Normocephalic and atraumatic.     Right Ear: Tympanic membrane, ear canal and external ear normal.     Left Ear: Tympanic membrane, ear canal and external ear normal.     Nose: Nose normal. No  congestion.     Mouth/Throat:     Mouth: Mucous membranes are moist.  Eyes:     Extraocular Movements: Extraocular movements intact.     Conjunctiva/sclera: Conjunctivae normal.     Pupils: Pupils are equal, round, and reactive to light.  Cardiovascular:     Rate and Rhythm: Normal rate and regular rhythm.     Pulses: Normal pulses.     Heart sounds: Normal heart sounds.  Pulmonary:     Effort: Pulmonary effort is normal.     Breath sounds: Normal breath sounds.  Musculoskeletal:        General: Normal range of motion.     Cervical back: Normal range of motion and neck supple.  Skin:    General: Skin is warm and dry.  Neurological:     General: No focal deficit present.     Mental Status: She is alert and oriented to person, place, and time.  Psychiatric:        Mood and Affect: Mood normal.        Behavior: Behavior normal.        Thought Content: Thought content normal.        Judgment: Judgment normal.       Assessment & Plan:   Problem List Items Addressed This Visit   None Visit Diagnoses     Acute URI    -  Primary       1. Acute URI -Symptoms have resolved aside from some hoarseness -Recommend continued conservative tx at this time - fluids, tea with honey, humidifier -Work note provided, ok to return today -Recheck prn    Milan Clare M Elky Funches, PA-C

## 2021-07-04 DIAGNOSIS — Z1211 Encounter for screening for malignant neoplasm of colon: Secondary | ICD-10-CM | POA: Diagnosis not present

## 2021-07-04 DIAGNOSIS — K219 Gastro-esophageal reflux disease without esophagitis: Secondary | ICD-10-CM | POA: Diagnosis not present

## 2021-08-08 ENCOUNTER — Encounter: Payer: Self-pay | Admitting: Physician Assistant

## 2021-08-08 ENCOUNTER — Ambulatory Visit: Payer: Federal, State, Local not specified - PPO | Admitting: Physician Assistant

## 2021-08-08 VITALS — BP 142/80 | HR 59 | Temp 97.8°F | Ht 66.0 in | Wt 208.2 lb

## 2021-08-08 DIAGNOSIS — R202 Paresthesia of skin: Secondary | ICD-10-CM

## 2021-08-08 DIAGNOSIS — M79602 Pain in left arm: Secondary | ICD-10-CM

## 2021-08-08 DIAGNOSIS — R7303 Prediabetes: Secondary | ICD-10-CM | POA: Diagnosis not present

## 2021-08-08 DIAGNOSIS — M25561 Pain in right knee: Secondary | ICD-10-CM

## 2021-08-08 DIAGNOSIS — M79601 Pain in right arm: Secondary | ICD-10-CM

## 2021-08-08 DIAGNOSIS — M25562 Pain in left knee: Secondary | ICD-10-CM

## 2021-08-08 DIAGNOSIS — G8929 Other chronic pain: Secondary | ICD-10-CM

## 2021-08-08 LAB — HEMOGLOBIN A1C: Hgb A1c MFr Bld: 6.1 % (ref 4.6–6.5)

## 2021-08-08 LAB — COMPREHENSIVE METABOLIC PANEL
ALT: 12 U/L (ref 0–35)
AST: 18 U/L (ref 0–37)
Albumin: 4.3 g/dL (ref 3.5–5.2)
Alkaline Phosphatase: 46 U/L (ref 39–117)
BUN: 10 mg/dL (ref 6–23)
CO2: 23 mEq/L (ref 19–32)
Calcium: 9.4 mg/dL (ref 8.4–10.5)
Chloride: 109 mEq/L (ref 96–112)
Creatinine, Ser: 0.79 mg/dL (ref 0.40–1.20)
GFR: 82.03 mL/min (ref >60.00)
Glucose, Bld: 92 mg/dL (ref 70–99)
Potassium: 4 mEq/L (ref 3.5–5.1)
Sodium: 138 mEq/L (ref 135–145)
Total Bilirubin: 0.4 mg/dL (ref 0.2–1.2)
Total Protein: 7.5 g/dL (ref 6.0–8.3)

## 2021-08-08 LAB — VITAMIN B12: Vitamin B-12: 354 pg/mL (ref 211–911)

## 2021-08-08 MED ORDER — DICLOFENAC SODIUM 3 % EX GEL: CUTANEOUS | 2 refills | Status: DC

## 2021-08-08 NOTE — Progress Notes (Signed)
? ?Subjective:  ? ? Patient ID: Natalie Vazquez, female    DOB: Aug 11, 1960, 61 y.o.   MRN: [id number] ? ?Chief Complaint  ?Patient presents with  ? Tingling  ?  Bilateral hands; 2 months ago. Started off at fingertips. More frequent. Increasing water intake helps.   ? Knee Pain  ?  Right; says injections have helped in the past but still has not subsided.  ? ? ?HPI ?Patient is in today for the following complaints: ? ?Paresthesias: ?Patient states that she has had a tingling feeling in her hands for the last 2 months or so.  Started off at her fingertips and is slowly moving up into her forearm.  Sensation seems to be more frequent.  Increasing her water intake during the day does seem to help. ? ?Bilateral chronic knee pain: ?-Takes tumeric and cinnamon ?-Working on weight loss ?-Used to do water aerobics  ?-Doesn't want injections or surgery yet ? ?Past Medical History:  ?Diagnosis Date  ? Allergic rhinitis due to other allergen   ? Allergy   ? seasonal  ? Anemia   ? Arthritis   ? Asthma   ? mild, intermittent  ? Chicken pox   ? Cholelithiasis   ? CTS (carpal tunnel syndrome)   ? Diabetes mellitus without complication ([place])   ? Fibroids   ? Fibromyalgia   ? Frequent urination   ? GERD (gastroesophageal reflux disease)   ? Headache(784.0)   ? Migraine   ? PONV (postoperative nausea and vomiting)   ? difficulty waking up  ? Problems related to high-risk sexual behavior   ? Unprotected sex  ? Symptomatic menopausal or female climacteric states   ? ? ?Past Surgical History:  ?Procedure Laterality Date  ? ABDOMINAL HYSTERECTOMY  2008  ? partial, ovaries remain  ? CARPAL TUNNEL RELEASE Right 2015  ? CHOLECYSTECTOMY  05/19/2011  ? Procedure: LAPAROSCOPIC CHOLECYSTECTOMY;  Surgeon: Zenovia Jarred, MD;  Location: [place];  Service: General;  Laterality: N/A;  ? LASER ABLATION CONDYLOMA CERVICAL / VULVAR    ? PERCUTANEOUS PINNING  01/22/2012  ? Procedure: PERCUTANEOUS PINNING EXTREMITY;  Surgeon: Marybelle Killings, MD;  Location: [place];  Service: Orthopedics;  Laterality: Right;  Closed Reduction and Pinning right 5th Metacarpal Fracture  ? TUBAL LIGATION    ? WISDOM TOOTH EXTRACTION    ? ? ?Family History  ?Problem Relation Age of Onset  ? Diabetes Mother   ?     Deceased  ? Hypertension Mother   ? Anemia Mother   ? Hyperlipidemia Mother   ? Arthritis/Rheumatoid Mother   ? Migraines Mother   ? CVA Mother   ? Hypertension Father   ?     Deceased  ? Stroke Father   ? Diabetes Father   ?     Legs Amputated  ? Heart disease Father   ? Arthritis Father   ? Cancer Father   ? Heart attack Maternal Aunt   ? Diabetes Maternal Uncle   ? Migraines Maternal Aunt   ? Asthma Maternal Aunt   ? Asthma Maternal Uncle   ? Stomach cancer Sister   ? Breast cancer Sister   ? Uterine cancer Sister   ?     or Ovarian  ? Sleep apnea Brother   ? COPD Brother   ? Asthma Sister   ?     x2  ? Sudden death Brother   ?     SIDS  ? Hypertension Brother   ?  Diabetes Sister   ? Asthma Daughter   ?     x2  ? Allergies Daughter   ? Sickle cell trait Other   ?     All Children  ? Atrial fibrillation Daughter   ? Anxiety disorder Daughter   ? Allergies Son   ? Hypertension Sister   ? Heart disease Sister   ? Asthma Brother   ? ? ?Social History  ? ?Tobacco Use  ? Smoking status: Never  ? Smokeless tobacco: Never  ?Vaping Use  ? Vaping Use: Never used  ?Substance Use Topics  ? Alcohol use: Yes  ?  Comment: rarely   ? Drug use: No  ?  ? ?Allergies  ?Allergen Reactions  ? Iodine Anaphylaxis  ? Shellfish Allergy Anaphylaxis  ? Latex Rash  ? ? ?Review of Systems ?NEGATIVE UNLESS OTHERWISE INDICATED IN HPI ? ? ?   ?Objective:  ?  ? ?BP (!) 142/80   Pulse (!) 59   Temp 97.8 ?F (36.6 ?C) (Temporal)   Ht '5\' 6"'$  (1.676 m)   Wt 208 lb 3.2 oz (94.4 kg)   SpO2 100%   BMI 33.60 kg/m?  ? ?Wt Readings from Last 3 Encounters:  ?08/08/21 208 lb 3.2 oz (94.4 kg)  ?05/12/21 215 lb 3.2 oz (97.6 kg)  ?01/22/21 207 lb (93.9 kg)  ? ? ?BP Readings from Last 3 Encounters:  ?08/08/21 (!) 142/80   ?05/12/21 127/83  ?01/22/21 126/80  ?  ? ?Physical Exam ?Vitals and nursing note reviewed.  ?Constitutional:   ?   Appearance: Normal appearance. She is obese. She is not toxic-appearing.  ?HENT:  ?   Head: Normocephalic and atraumatic.  ?   Right Ear: External ear normal.  ?   Left Ear: External ear normal.  ?   Nose: Nose normal.  ?   Mouth/Throat:  ?   Mouth: Mucous membranes are moist.  ?Eyes:  ?   Extraocular Movements: Extraocular movements intact.  ?   Conjunctiva/sclera: Conjunctivae normal.  ?   Pupils: Pupils are equal, round, and reactive to light.  ?Cardiovascular:  ?   Rate and Rhythm: Normal rate and regular rhythm.  ?   Pulses: Normal pulses.  ?   Heart sounds: Normal heart sounds.  ?Pulmonary:  ?   Effort: Pulmonary effort is normal.  ?   Breath sounds: Normal breath sounds.  ?Musculoskeletal:     ?   General: Normal range of motion.  ?   Cervical back: Normal range of motion and neck supple.  ?Skin: ?   General: Skin is warm and dry.  ?Neurological:  ?   Mental Status: She is alert and oriented to person, place, and time.  ?   Comments: She has pinprick sensation bilateral hands on palmar and dorsal surfaces up into the forearm.  She does note a slight difference in the sensation from the wrist compared to the more proximal forearm. ? ?Grip strength is normal bilateral hands.  Capillary refill normal.  ?Psychiatric:     ?   Mood and Affect: Mood normal.     ?   Behavior: Behavior normal.  ? ? ?   ?Assessment & Plan:  ? ?Problem List Items Addressed This Visit   ? ?  ? Other  ? Pre-diabetes  ? Relevant Orders  ? Hemoglobin A1c (Completed)  ? Comprehensive metabolic panel (Completed)  ? ?Other Visit Diagnoses   ? ? Paresthesia and pain of both upper extremities    -  Primary  ? Relevant Orders  ? Comprehensive metabolic panel (Completed)  ? Vitamin B12 (Completed)  ? Chronic pain of both knees      ? ?  ? ? ? ?Meds ordered this encounter  ?Medications  ? Diclofenac Sodium 3 % GEL  ?  Sig: Apply thin  layer two to three times daily over both areas of knee pain.  ?  Dispense:  100 g  ?  Refill:  2  ?  Order Specific Question:   Supervising Provider  ?  Answer:   Marin Olp [[id number]]  ? ?1. Paresthesia and pain of both upper extremities ?2. Pre-diabetes ?Discussed with patient that her sensation could be secondary to elevated glucose levels.  Plan to recheck labs today and treat accordingly.  Also need to consider possible low B12 etiology.  Otherwise possible idiopathic etiology.  Plan to refer to neurology if labs are not helpful today.  Could consider gabapentin for treatment as well. ? ?3. Chronic pain of both knees ?Discussed with patient options about management of the pain in her knees.  She is working on weight loss.  She has never tried Voltaren gel, I prescribed this for her today.  She can also continue to ice as needed.  Get back into water aerobics.  She will let me know when she is ready to consult with orthopedics. ? ?Plan for follow-up in 3 months or as needed. ? ? ?This note was prepared with assistance of Systems analyst. Occasional wrong-word or sound-a-like substitutions may have occurred due to the inherent limitations of voice recognition software. ? ? ?Delona Clasby M Jyden Kromer, PA-C ?

## 2021-08-11 ENCOUNTER — Other Ambulatory Visit: Payer: Self-pay

## 2021-08-11 DIAGNOSIS — M79601 Pain in right arm: Secondary | ICD-10-CM

## 2021-08-12 ENCOUNTER — Ambulatory Visit: Payer: Federal, State, Local not specified - PPO | Admitting: Physician Assistant

## 2021-08-12 ENCOUNTER — Encounter: Payer: Self-pay | Admitting: Neurology

## 2021-09-05 DIAGNOSIS — Z1211 Encounter for screening for malignant neoplasm of colon: Secondary | ICD-10-CM | POA: Diagnosis not present

## 2021-09-05 DIAGNOSIS — D125 Benign neoplasm of sigmoid colon: Secondary | ICD-10-CM | POA: Diagnosis not present

## 2021-09-05 DIAGNOSIS — J45909 Unspecified asthma, uncomplicated: Secondary | ICD-10-CM | POA: Diagnosis not present

## 2021-09-05 DIAGNOSIS — K648 Other hemorrhoids: Secondary | ICD-10-CM | POA: Diagnosis not present

## 2021-09-05 DIAGNOSIS — K635 Polyp of colon: Secondary | ICD-10-CM | POA: Diagnosis not present

## 2021-09-10 DIAGNOSIS — G43019 Migraine without aura, intractable, without status migrainosus: Secondary | ICD-10-CM | POA: Diagnosis not present

## 2021-09-10 DIAGNOSIS — R519 Headache, unspecified: Secondary | ICD-10-CM | POA: Diagnosis not present

## 2021-11-10 ENCOUNTER — Ambulatory Visit: Payer: Federal, State, Local not specified - PPO | Admitting: Physician Assistant

## 2021-11-10 DIAGNOSIS — Z9889 Other specified postprocedural states: Secondary | ICD-10-CM | POA: Diagnosis not present

## 2021-11-10 DIAGNOSIS — H16143 Punctate keratitis, bilateral: Secondary | ICD-10-CM | POA: Diagnosis not present

## 2021-11-10 DIAGNOSIS — H1045 Other chronic allergic conjunctivitis: Secondary | ICD-10-CM | POA: Diagnosis not present

## 2021-11-10 DIAGNOSIS — H2513 Age-related nuclear cataract, bilateral: Secondary | ICD-10-CM | POA: Diagnosis not present

## 2021-11-11 ENCOUNTER — Ambulatory Visit: Payer: Federal, State, Local not specified - PPO | Admitting: Physician Assistant

## 2021-11-11 ENCOUNTER — Encounter: Payer: Self-pay | Admitting: Physician Assistant

## 2021-11-11 VITALS — BP 133/78 | HR 60 | Temp 98.2°F | Ht 66.0 in | Wt 213.8 lb

## 2021-11-11 DIAGNOSIS — R7303 Prediabetes: Secondary | ICD-10-CM

## 2021-11-11 DIAGNOSIS — R202 Paresthesia of skin: Secondary | ICD-10-CM | POA: Diagnosis not present

## 2021-11-11 DIAGNOSIS — M79601 Pain in right arm: Secondary | ICD-10-CM | POA: Diagnosis not present

## 2021-11-11 DIAGNOSIS — Z23 Encounter for immunization: Secondary | ICD-10-CM | POA: Diagnosis not present

## 2021-11-11 DIAGNOSIS — M79602 Pain in left arm: Secondary | ICD-10-CM

## 2021-11-11 DIAGNOSIS — M25561 Pain in right knee: Secondary | ICD-10-CM | POA: Diagnosis not present

## 2021-11-11 DIAGNOSIS — M25562 Pain in left knee: Secondary | ICD-10-CM

## 2021-11-11 DIAGNOSIS — G8929 Other chronic pain: Secondary | ICD-10-CM

## 2021-11-11 LAB — POCT GLYCOSYLATED HEMOGLOBIN (HGB A1C): Hemoglobin A1C: 5.8 % — AB (ref 4.0–5.6)

## 2021-11-11 NOTE — Progress Notes (Signed)
Subjective:    Patient ID: Natalie Vazquez, female    DOB: 03/11/1961, 61 y.o.   MRN: [id number]  Chief Complaint  Patient presents with   Follow-up    Pt here for 3 mon f/u; pt states everything is going well; pt states left knee still giving issues at times; right knee is not as bad;     HPI Patient is in today for f/up on paresthesias and chronic knee pain.   Tingling in hands are doing better, happening less frequently. Taking a new OTC supplement and thinks this is helping. Taking Berberine, cinnamon, tumeric, olive leaf, melia supreme. No side effects except some extra energy and insomnia. Did not take gabapentin or see neurology; didn't feel it necessary. Still having some pain in knees, L usually worse than R, but says she's been working with her son on physical activity and this helps.     Past Medical History:  Diagnosis Date   Allergic rhinitis due to other allergen    Allergy    seasonal   Anemia    Arthritis    Asthma    mild, intermittent   Chicken pox    Cholelithiasis    CTS (carpal tunnel syndrome)    Diabetes mellitus without complication (HCC)    Fibroids    Fibromyalgia    Frequent urination    GERD (gastroesophageal reflux disease)    Headache(784.0)    Migraine    PONV (postoperative nausea and vomiting)    difficulty waking up   Problems related to high-risk sexual behavior    Unprotected sex   Symptomatic menopausal or female climacteric states     Past Surgical History:  Procedure Laterality Date   ABDOMINAL HYSTERECTOMY  2008   partial, ovaries remain   CARPAL TUNNEL RELEASE Right 2015   CHOLECYSTECTOMY  05/19/2011   Procedure: LAPAROSCOPIC CHOLECYSTECTOMY;  Surgeon: Zenovia Jarred, MD;  Location: [place];  Service: General;  Laterality: N/A;   LASER ABLATION [place] / VULVAR     PERCUTANEOUS PINNING  01/22/2012   Procedure: PERCUTANEOUS PINNING EXTREMITY;  Surgeon: Marybelle Killings, MD;  Location: [place];  Service: Orthopedics;   Laterality: Right;  Closed Reduction and Pinning right 5th Metacarpal Fracture   TUBAL LIGATION     WISDOM TOOTH EXTRACTION      Family History  Problem Relation Age of Onset   Diabetes Mother        Deceased   Hypertension Mother    Anemia Mother    Hyperlipidemia Mother    Arthritis/Rheumatoid Mother    Migraines Mother    CVA Mother    Hypertension Father        Deceased   Stroke Father    Diabetes Father        Legs Amputated   Heart disease Father    Arthritis Father    Cancer Father    Heart attack Maternal Aunt    Diabetes Maternal Uncle    Migraines Maternal Aunt    Asthma Maternal Aunt    Asthma Maternal Uncle    Stomach cancer Sister    Breast cancer Sister    Uterine cancer Sister        or Ovarian   Sleep apnea Brother    COPD Brother    Asthma Sister        x2   Sudden death Brother        SIDS   Hypertension Brother    Diabetes Sister  Asthma Daughter        x2   Allergies Daughter    Sickle cell trait Other        All Children   Atrial fibrillation Daughter    Anxiety disorder Daughter    Allergies Son    Hypertension Sister    Heart disease Sister    Asthma Brother     Social History   Tobacco Use   Smoking status: Never   Smokeless tobacco: Never  Vaping Use   Vaping Use: Never used  Substance Use Topics   Alcohol use: Yes    Comment: rarely    Drug use: No     Allergies  Allergen Reactions   Iodine Anaphylaxis   Shellfish Allergy Anaphylaxis   Latex Rash    Review of Systems NEGATIVE UNLESS OTHERWISE INDICATED IN HPI      Objective:     BP 133/78 (BP Location: Left Arm)   Pulse 60   Temp 98.2 F (36.8 C) (Temporal)   Ht '5\' 6"'$  (1.676 m)   Wt 213 lb 12.8 oz (97 kg)   SpO2 99%   BMI 34.51 kg/m   Wt Readings from Last 3 Encounters:  11/11/21 213 lb 12.8 oz (97 kg)  08/08/21 208 lb 3.2 oz (94.4 kg)  05/12/21 215 lb 3.2 oz (97.6 kg)    BP Readings from Last 3 Encounters:  11/11/21 133/78  08/08/21 (!)  142/80  05/12/21 127/83     Physical Exam Vitals and nursing note reviewed.  Constitutional:      Appearance: Normal appearance. She is obese.  Cardiovascular:     Rate and Rhythm: Normal rate and regular rhythm.     Pulses: Normal pulses.     Heart sounds: Normal heart sounds. No murmur heard. Pulmonary:     Effort: Pulmonary effort is normal. No respiratory distress.     Breath sounds: Normal breath sounds. No wheezing.  Musculoskeletal:     Right lower leg: No edema.     Left lower leg: No edema.     Comments: Demonstrates a full, deep squat while in office with some normal crepitus; no pain, great ROM  Neurological:     General: No focal deficit present.     Mental Status: She is alert and oriented to person, place, and time.  Psychiatric:        Mood and Affect: Mood normal.        Behavior: Behavior normal.        Assessment & Plan:   Problem List Items Addressed This Visit       Other   Pre-diabetes   Relevant Orders   POCT HgB A1C (Completed)   Other Visit Diagnoses     Paresthesia and pain of both upper extremities    -  Primary   Need for prophylactic vaccination and inoculation against varicella       Relevant Orders   Zoster Recombinant (Shingrix ) (Completed)       Plan: -Congratulated patient on weight loss and staying active. -Ha1c is down to 5.8% today, compared to 6.1% three months ago; she's doing great -She will keep up lifestyle changes; she will keep up with her supplements as long as not causing harm or side effects -Shingrix completed today -F/up with ortho prn about her knees, says she's doing good right now   Return in about 6 months (around 05/14/2022) for fasting labs and recheck .     Abisai Coble M Flossie Wexler, PA-C

## 2021-11-11 NOTE — Patient Instructions (Signed)
You're doing amazing!!! Hemoglobin A1c is now 5.8. Keep up great work! Call if any concerns. See you in 6 months for fasting labs and recheck.

## 2021-11-28 ENCOUNTER — Encounter: Payer: Self-pay | Admitting: Physician Assistant

## 2021-12-08 ENCOUNTER — Encounter: Payer: Self-pay | Admitting: Neurology

## 2021-12-08 ENCOUNTER — Ambulatory Visit: Payer: Federal, State, Local not specified - PPO | Admitting: Neurology

## 2021-12-08 DIAGNOSIS — Z029 Encounter for administrative examinations, unspecified: Secondary | ICD-10-CM

## 2022-01-05 ENCOUNTER — Encounter: Payer: Self-pay | Admitting: *Deleted

## 2022-01-16 ENCOUNTER — Ambulatory Visit: Payer: Federal, State, Local not specified - PPO | Admitting: Neurology

## 2022-01-23 ENCOUNTER — Ambulatory Visit: Payer: Federal, State, Local not specified - PPO | Admitting: Neurology

## 2022-01-23 ENCOUNTER — Encounter: Payer: Self-pay | Admitting: Neurology

## 2022-01-23 VITALS — BP 130/76 | HR 66 | Ht 66.75 in | Wt 207.0 lb

## 2022-01-23 DIAGNOSIS — R202 Paresthesia of skin: Secondary | ICD-10-CM | POA: Diagnosis not present

## 2022-01-23 NOTE — Patient Instructions (Signed)
We will order nerve testing of the hands  ELECTROMYOGRAM AND NERVE CONDUCTION STUDIES (EMG/NCS) INSTRUCTIONS  How to Prepare The neurologist conducting the EMG will need to know if you have certain medical conditions. Tell the neurologist and other EMG lab personnel if you: Have a pacemaker or any other electrical medical device Take blood-thinning medications Have hemophilia, a blood-clotting disorder that causes prolonged bleeding Bathing Take a shower or bath shortly before your exam in order to remove oils from your skin. Don't apply lotions or creams before the exam.  What to Expect You'll likely be asked to change into a hospital gown for the procedure and lie down on an examination table. The following explanations can help you understand what will happen during the exam.  Electrodes. The neurologist or a technician places surface electrodes at various locations on your skin depending on where you're experiencing symptoms. Or the neurologist may insert needle electrodes at different sites depending on your symptoms.  Sensations. The electrodes will at times transmit a tiny electrical current that you may feel as a twinge or spasm. The needle electrode may cause discomfort or pain that usually ends shortly after the needle is removed. If you are concerned about discomfort or pain, you may want to talk to the neurologist about taking a short break during the exam.  Instructions. During the needle EMG, the neurologist will assess whether there is any spontaneous electrical activity when the muscle is at rest - activity that isn't present in healthy muscle tissue - and the degree of activity when you slightly contract the muscle.  He or she will give you instructions on resting and contracting a muscle at appropriate times. Depending on what muscles and nerves the neurologist is examining, he or she may ask you to change positions during the exam.  After your EMG You may experience some  temporary, minor bruising where the needle electrode was inserted into your muscle. This bruising should fade within several days. If it persists, contact your primary care doctor.

## 2022-01-23 NOTE — Progress Notes (Signed)
[facility] Clinic Note - Initial Visit   Date: 01/23/2022   Natalie Vazquez MRN: [id number] DOB: 1960/11/11   Dear Lujean Rave, PA-C:  Thank you for your kind referral of Natalie Vazquez for consultation of hand paresthesias. Although her history is well known to you, please allow [place] to reiterate it for the purpose of our medical record. The patient was accompanied to the clinic by self.     Natalie Vazquez is a 61 y.o. right-handed female with GERD, fibromylagia, and prediabetes presenting for evaluation of bilateral hand paresthesias.   IMPRESSION/PLAN: Bilateral hand paresthesias.  Numbness/tingling may be entrapment neuropathy, however, this would not explain cold hands. I will obtain NCS/EMG of the arms to better characterize the nature of her symptoms.  Further recommendations pending results.   ------------------------------------------------------------- History of present illness: Starting earlier in 2023, she complains of cold sensation involving the hands, which occurs daily.  Sometimes, she has numbness/tingling in the hands. She has some weakness in the right hand.  No neck pain or radicular arm pain.  She has history of CTS release which improved her numbness/tingling.  Out-side paper records, electronic medical record, and images have been reviewed where available and summarized as:  Lab Results  Component Value Date   HGBA1C 5.8 (A) 11/11/2021   Lab Results  Component Value Date   VITAMINB12 354 08/08/2021   Lab Results  Component Value Date   TSH 0.65 02/10/2019   Lab Results  Component Value Date   ESRSEDRATE 17 05/30/2020    Past Medical History:  Diagnosis Date   Allergic rhinitis due to other allergen    Allergy    seasonal   Anemia    Arthritis    Asthma    mild, intermittent   Chicken pox    Cholelithiasis    CTS (carpal tunnel syndrome)    Diabetes mellitus without complication (HCC)    Fibroids     Fibromyalgia    Frequent urination    GERD (gastroesophageal reflux disease)    Headache(784.0)    Migraine    PONV (postoperative nausea and vomiting)    difficulty waking up   Problems related to high-risk sexual behavior    Unprotected sex   Symptomatic menopausal or female climacteric states     Past Surgical History:  Procedure Laterality Date   ABDOMINAL HYSTERECTOMY  2008   partial, ovaries remain   CARPAL TUNNEL RELEASE Right 2015   CHOLECYSTECTOMY  05/19/2011   Procedure: LAPAROSCOPIC CHOLECYSTECTOMY;  Surgeon: Zenovia Jarred, MD;  Location: [facility];  Service: General;  Laterality: N/A;   LASER ABLATION [place] / VULVAR     PERCUTANEOUS PINNING  01/22/2012   Procedure: PERCUTANEOUS PINNING EXTREMITY;  Surgeon: Marybelle Killings, MD;  Location: [facility];  Service: Orthopedics;  Laterality: Right;  Closed Reduction and Pinning right 5th Metacarpal Fracture   TUBAL LIGATION     WISDOM TOOTH EXTRACTION       Medications:  Outpatient Encounter Medications as of 01/23/2022  Medication Sig Note   Barberry-Oreg Grape-Goldenseal (BERBERINE COMPLEX PO) Take by mouth.    Bee Pollen 1000 MG TABS Take 1 tablet by mouth.    Biotin 1000 MCG tablet Take 1,000 mcg by mouth daily. Three times a week    cetirizine (ZYRTEC ALLERGY) 10 MG tablet Take 1 tablet (10 mg total) by mouth daily.    Cholecalciferol (VITAMIN D-3) 25 MCG (1000 UT) CAPS Take 1 capsule (1,000 Units total) by mouth  daily.    cyclobenzaprine (FLEXERIL) 10 MG tablet 1 tablet PO QHS PRN    EPINEPHrine 0.3 mg/0.3 mL IJ SOAJ injection Inject 0.3 mg into the muscle as needed for anaphylaxis.    levalbuterol (XOPENEX HFA) 45 MCG/ACT inhaler INHALE 1 TO 2 PUFFS BY MOUTH EVERY 6 HOURS AS NEEDED FOR WHEEZING 02/10/2019: PRN   NON FORMULARY Take 3 tablets by mouth as needed. Swiss Kriss Herbal Laxative Tab    OLIVE LEAF PO Take by mouth.    Olopatadine HCl 0.2 % SOLN Apply 1 drop to eye daily as needed (itchy/watery eyes).     SUMAtriptan (IMITREX) 100 MG tablet Take one tablet by mouth onset of headache, May repeat in 2 hours if headache persists or recurs.    Turmeric 500 MG CAPS Take 1 tablet by mouth daily.    vitamin E 180 MG (400 UNITS) capsule Take 1 capsule by mouth daily.    zonisamide (ZONEGRAN) 25 MG capsule Take 1 capsule by mouth daily. 05/22/2016: PRN for headaches   CONTRAVE 8-90 MG TB12 Take 2 tablets by mouth 2 (two) times daily. (Patient not taking: Reported on 01/23/2022)    Diclofenac Sodium 3 % GEL Apply thin layer two to three times daily over both areas of knee pain. (Patient not taking: Reported on 01/23/2022)    pantoprazole (PROTONIX) 20 MG tablet Take by mouth. (Patient not taking: Reported on 01/23/2022)    No facility-administered encounter medications on file as of 01/23/2022.    Allergies:  Allergies  Allergen Reactions   Iodine Anaphylaxis   Shellfish Allergy Anaphylaxis   Latex Rash    Family History: Family History  Problem Relation Age of Onset   Diabetes Mother        Deceased   Hypertension Mother    Anemia Mother    Hyperlipidemia Mother    Arthritis/Rheumatoid Mother    Migraines Mother    CVA Mother    Hypertension Father        Deceased   Stroke Father    Diabetes Father        Legs Amputated   Heart disease Father    Arthritis Father    Cancer Father    Heart attack Maternal Aunt    Diabetes Maternal Uncle    Migraines Maternal Aunt    Asthma Maternal Aunt    Asthma Maternal Uncle    Stomach cancer Sister    Breast cancer Sister    Uterine cancer Sister        or Ovarian   Sleep apnea Brother    COPD Brother    Asthma Sister        x2   Sudden death Brother        SIDS   Hypertension Brother    Diabetes Sister    Asthma Daughter        x2   Allergies Daughter    Sickle cell trait Other        All Children   Atrial fibrillation Daughter    Anxiety disorder Daughter    Allergies Son    Hypertension Sister    Heart disease Sister     Asthma Brother     Social History: Social History   Tobacco Use   Smoking status: Never   Smokeless tobacco: Never  Vaping Use   Vaping Use: Never used  Substance Use Topics   Alcohol use: Yes    Comment: rarely    Drug use: No   Social History  Social History Narrative   Right handed    Caffeine occasionally   Two story home    Vital Signs:  BP 130/76   Pulse 66   Ht 5' 6.75" (1.695 m)   Wt 207 lb (93.9 kg)   SpO2 100%   BMI 32.66 kg/m   Neurological Exam: MENTAL STATUS including orientation to time, place, person, recent and remote memory, attention span and concentration, language, and fund of knowledge is normal.  Speech is not dysarthric.  CRANIAL NERVES: II:  No visual field defects.    III-IV-VI: Pupils equal round and reactive to light.  Normal conjugate, extra-ocular eye movements in all directions of gaze.  No nystagmus.  No ptosis.   V:  Normal facial sensation.    VII:  Normal facial symmetry and movements.   VIII:  Normal hearing and vestibular function.   IX-X:  Normal palatal movement.   XI:  Normal shoulder shrug and head rotation.   XII:  Normal tongue strength and range of motion, no deviation or fasciculation.  MOTOR:  Motor strength is 5/5 throughout.  No atrophy, fasciculations or abnormal movements.  No pronator drift.   MSRs:                                           Right        Left brachioradialis 2+  2+  biceps 2+  2+  triceps 2+  2+  patellar 2+  2+  ankle jerk 2+  2+    SENSORY:  Normal and symmetric perception of light touch, pinprick, vibration, and proprioception. Tinel's sign is negative at the wrists.   COORDINATION/GAIT: Normal finger-to- nose-finger .  Intact rapid alternating movements bilaterally.  Gait narrow based and stable. Tandem and stressed gait intact.    Thank you for allowing me to participate in patient's care.  If I can answer any additional questions, I would be pleased to do so.     Sincerely,    Harvel Meskill K. Posey Pronto, DO

## 2022-03-12 ENCOUNTER — Ambulatory Visit: Payer: Federal, State, Local not specified - PPO | Admitting: Neurology

## 2022-03-12 DIAGNOSIS — G5622 Lesion of ulnar nerve, left upper limb: Secondary | ICD-10-CM

## 2022-03-12 DIAGNOSIS — R202 Paresthesia of skin: Secondary | ICD-10-CM | POA: Diagnosis not present

## 2022-03-12 NOTE — Procedures (Signed)
[facility]  [place]  [place], [place] [address] Tel: [phone] Fax:  [phone] Test Date:  03/12/2022  Patient: Natalie Vazquez DOB: 12/04/1960 Physician: Arvin Collard Johnn Krasowski,DO  Sex: Female Height: '5\' 6"'$  Ref Phys: Narda Amber, DO  ID#: [id number]   Technician:    Patient Complaints: This is a 61 year old female referred for evaluation of bilateral hand paresthesias.  NCV & EMG Findings: Extensive electrodiagnostic testing of the right upper extremity and additional studies of the left shows:  Right mixed palmar sensory responses show prolonged latency.  Bilateral median, bilateral ulnar, and left mixed palmar sensory responses are within normal limits. Bilateral median and right ulnar motor responses are within normal limits.  Left ulnar motor response shows slowed conduction velocity across the elbow (A Elbow-B Elbow, 42 m/s).   There is no evidence of active or chronic motor axonal loss changes affecting any of the tested muscles.  Motor unit configuration and recruitment pattern is within normal limits.  Impression: Right median neuropathy at or distal to the wrist, consistent with a clinical diagnosis of carpal tunnel syndrome.  Overall, these findings are very mild in degree electrically. Left ulnar neuropathy with slowing across the elbow, demyelinating, mild.   ___________________________ Arvin Collard Natalie Cashwell,DO    Nerve Conduction Studies Anti Sensory Summary Table   Stim Site NR Peak (ms) Norm Peak (ms) O-P Amp (V) Norm O-P Amp  Left Median Anti Sensory (2nd Digit)  33C  Wrist    3.4 <3.6 28.1 >15  Right Median Anti Sensory (2nd Digit)  33C  Wrist    3.4 <3.6 32.9 >15  Left Ulnar Anti Sensory (5th Digit)  33C  Wrist    2.9 <3.1 25.1 >10  Right Ulnar Anti Sensory (5th Digit)  33C  Wrist    3.0 <3.1 25.2 >10   Motor Summary Table   Stim Site NR Onset (ms) Norm Onset (ms) O-P Amp (mV) Norm O-P Amp Site1 Site2 Delta-0 (ms) Dist (cm) Vel  (m/s) Norm Vel (m/s)  Left Median Motor (Abd Poll Brev)  33C  Wrist    3.2 <4.0 12.7 >6 Elbow Wrist 5.5 30.0 55 >50  Elbow    8.7  11.2         Right Median Motor (Abd Poll Brev)  33C  Wrist    3.1 <4.0 16.9 >6 Elbow Wrist 5.2 29.0 56 >50  Elbow    8.3  16.0         Left Ulnar Motor (Abd Dig Minimi)  33C  Wrist    2.6 <3.1 11.1 >7 B Elbow Wrist 3.7 24.0 65 >50  B Elbow    6.3  10.9  A Elbow B Elbow 2.4 10.0 42 >50  A Elbow    8.7  10.9         Right Ulnar Motor (Abd Dig Minimi)  33C  Wrist    1.8 <3.1 11.4 >7 B Elbow Wrist 4.5 24.0 53 >50  B Elbow    6.3  11.3  A Elbow B Elbow 1.7 10.0 59 >50  A Elbow    8.0  11.0          Comparison Summary Table   Stim Site NR Peak (ms) Norm Peak (ms) P-T Amp (V) Site1 Site2 Delta-P (ms) Norm Delta (ms)  Left Median/Ulnar Palm Comparison (Wrist - 8cm)  33C  Median Palm    1.9 <2.2 65.0 Median Palm Ulnar Palm 0.1   Ulnar Palm    1.8 <2.2  8.5      Right Median/Ulnar Palm Comparison (Wrist - 8cm)  33C  Median Palm    2.0 <2.2 34.2 Median Palm Ulnar Palm 0.5   Ulnar Palm    1.5 <2.2 14.0       EMG   Side Muscle Ins Act Fibs Fasc Recrt Dur. Amp. Poly. Activation Comment  Right 1stDorInt Nml Nml Nml Nml Nml Nml Nml Nml N/A  Right Abd Poll Brev Nml Nml Nml Nml Nml Nml Nml Nml N/A  Right PronatorTeres Nml Nml Nml Nml Nml Nml Nml Nml N/A  Right Biceps Nml Nml Nml Nml Nml Nml Nml Nml N/A  Right Triceps Nml Nml Nml Nml Nml Nml Nml Nml N/A  Right Deltoid Nml Nml Nml Nml Nml Nml Nml Nml N/A  Left 1stDorInt Nml Nml Nml Nml Nml Nml Nml Nml N/A  Left PronatorTeres Nml Nml Nml Nml Nml Nml Nml Nml N/A  Left Biceps Nml Nml Nml Nml Nml Nml Nml Nml N/A  Left Triceps Nml Nml Nml Nml Nml Nml Nml Nml N/A  Left Deltoid Nml Nml Nml Nml Nml Nml Nml Nml N/A  Left FlexCarpiUln Nml Nml Nml Nml Nml Nml Nml Nml N/A      Waveforms:

## 2022-03-25 ENCOUNTER — Ambulatory Visit: Payer: Federal, State, Local not specified - PPO | Admitting: Neurology

## 2022-03-30 ENCOUNTER — Encounter: Payer: Self-pay | Admitting: Neurology

## 2022-03-30 ENCOUNTER — Ambulatory Visit: Payer: Federal, State, Local not specified - PPO | Admitting: Neurology

## 2022-03-30 VITALS — BP 110/71 | HR 60 | Ht 66.0 in | Wt 208.6 lb

## 2022-03-30 DIAGNOSIS — G5622 Lesion of ulnar nerve, left upper limb: Secondary | ICD-10-CM

## 2022-03-30 DIAGNOSIS — G5601 Carpal tunnel syndrome, right upper limb: Secondary | ICD-10-CM | POA: Diagnosis not present

## 2022-03-30 NOTE — Progress Notes (Signed)
Follow-up Visit   Date: 03/30/2022    Natalie Vazquez MRN: [id number] DOB: 1961-02-21    Natalie Vazquez is a 61 y.o.  right-handed female with GERD, fibromylagia, and prediabetes returning to the clinic for follow-up of bilateral hand paresthesias.  The patient was accompanied to the clinic by self.  IMPRESSION/PLAN: Right carpal tunnel syndrome, mild, likely residual from prior CTS release Left ulnar neuropathy at the elbow  Both entrapment neuropathies are mild.  At this point, I recommend conservative therapies such as using a right wrist brace and strategies to minimize nerve compression were discussed.    Return to clinic as needed  --------------------------------------------- History of present illness: Starting earlier in 2023, she complains of cold sensation involving the hands, which occurs daily.  Sometimes, she has numbness/tingling in the hands. She has some weakness in the right hand.  No neck pain or radicular arm pain.   She has history of CTS release which improved her numbness/tingling.  UPDATE 03/30/2022:  She is here for follow-up visit.  Her hands are cold only at work, but since she started wearing gloves it has helped her cold sensation.  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  The numbness/tingling in the hands occurs about once per week and can last anywhere from a few seconds to a few hours.  She has mild right hand weakness.  No neck pain or shooting pain.    Medications:  Current Outpatient Medications on File Prior to Visit  Medication Sig Dispense Refill   Barberry-Oreg Grape-Goldenseal (BERBERINE COMPLEX PO) Take by mouth.     Bee Pollen 1000 MG TABS Take 1 tablet by mouth.     Biotin 1000 MCG tablet Take 1,000 mcg by mouth daily. Three times a week     cetirizine (ZYRTEC ALLERGY) 10 MG tablet Take 1 tablet (10 mg total) by mouth daily. 30 tablet 11   Cholecalciferol (VITAMIN D-3) 25 MCG (1000 UT) CAPS Take 1  capsule (1,000 Units total) by mouth daily. 30 capsule 5   CONTRAVE 8-90 MG TB12 Take 2 tablets by mouth 2 (two) times daily.     cyclobenzaprine (FLEXERIL) 10 MG tablet 1 tablet PO QHS PRN 30 tablet 0   Diclofenac Sodium 3 % GEL Apply thin layer two to three times daily over both areas of knee pain. 100 g 2   EPINEPHrine 0.3 mg/0.3 mL IJ SOAJ injection Inject 0.3 mg into the muscle as needed for anaphylaxis. 2 each 1   levalbuterol (XOPENEX HFA) 45 MCG/ACT inhaler INHALE 1 TO 2 PUFFS BY MOUTH EVERY 6 HOURS AS NEEDED FOR WHEEZING 15 g 5   NON FORMULARY Take 3 tablets by mouth as needed. Swiss Kriss Herbal Laxative Tab     NON FORMULARY Golden Thread Suprene 60-50 one time a day     NON FORMULARY Melia supreme 445 mg  one time a day     NON FORMULARY Neuro - mag L-T one a day     OLIVE LEAF PO Take by mouth.     Olopatadine HCl 0.2 % SOLN Apply 1 drop to eye daily as needed (itchy/watery eyes). 2.5 mL 11   pantoprazole (PROTONIX) 20 MG tablet Take by mouth.     SUMAtriptan (IMITREX) 100 MG tablet Take one tablet by mouth onset of headache, May repeat in 2 hours if headache persists or recurs. 10 tablet 0   Turmeric 500 MG CAPS Take 1 tablet by mouth  daily.     vitamin E 180 MG (400 UNITS) capsule Take 1 capsule by mouth daily.     zonisamide (ZONEGRAN) 25 MG capsule Take 1 capsule by mouth daily.  5   No current facility-administered medications on file prior to visit.    Allergies:  Allergies  Allergen Reactions   Iodine Anaphylaxis   Shellfish Allergy Anaphylaxis   Latex Rash    Vital Signs:  BP 110/71   Pulse 60   Ht '5\' 6"'$  (1.676 m)   Wt 208 lb 9.6 oz (94.6 kg)   SpO2 99%   BMI 33.67 kg/m    Neurological Exam: MENTAL STATUS including orientation to time, place, person, recent and remote memory, attention span and concentration, language, and fund of knowledge is normal.  Speech is not dysarthric.  CRANIAL NERVES:  No visual field defects.  Pupils equal round and reactive  to light.  Normal conjugate, extra-ocular eye movements in all directions of gaze.  No ptosis.  Face is symmetric.   MOTOR:  Motor strength is 5/5 in all extremities.  No atrophy, fasciculations or abnormal movements.  No pronator drift.  Tone is normal.    MSRs:  Reflexes are 2+/4 throughout.  SENSORY:  Intact to vibration throughout.  COORDINATION/GAIT:  Normal finger-to- nose-finger.    Gait narrow based and stable.   Data: NCS/EMG of the bilateral arms 03/12/2022: Right median neuropathy at or distal to the wrist, consistent with a clinical diagnosis of carpal tunnel syndrome.  Overall, these findings are very mild in degree electrically. Left ulnar neuropathy with slowing across the elbow, demyelinating, mild.   Thank you for allowing me to participate in patient's care.  If I can answer any additional questions, I would be pleased to do so.    Sincerely,    Darcel Frane K. Posey Pronto, DO

## 2022-05-22 DIAGNOSIS — G43019 Migraine without aura, intractable, without status migrainosus: Secondary | ICD-10-CM | POA: Diagnosis not present

## 2022-06-14 ENCOUNTER — Ambulatory Visit (HOSPITAL_COMMUNITY)
Admission: EM | Admit: 2022-06-14 | Discharge: 2022-06-14 | Disposition: A | Discharge status: Home / Self Care | Payer: Federal, State, Local not specified - PPO | Attending: Emergency Medicine | Admitting: Emergency Medicine

## 2022-06-14 ENCOUNTER — Encounter (HOSPITAL_COMMUNITY): Payer: Self-pay | Admitting: *Deleted

## 2022-06-14 ENCOUNTER — Other Ambulatory Visit: Payer: Self-pay

## 2022-06-14 DIAGNOSIS — M79604 Pain in right leg: Secondary | ICD-10-CM | POA: Diagnosis not present

## 2022-06-14 MED ORDER — IBUPROFEN 800 MG PO TABS: 800.0000 mg | ORAL_TABLET | Freq: Three times a day (TID) | ORAL | 0 refills | Status: DC | PRN

## 2022-06-14 MED ORDER — DOXYCYCLINE HYCLATE 100 MG PO CAPS: 100.0000 mg | ORAL_CAPSULE | Freq: Two times a day (BID) | ORAL | 0 refills | Status: DC

## 2022-06-14 NOTE — Discharge Instructions (Signed)
Symptoms today are most likely a contusion (bruising underneath the skin) versus infection and therefore you will be treated for both  Begin doxycycline every morning and every evening for 7 days to provide coverage for bacteria which may be adding to your symptoms  Begin use of ibuprofen 800 mg every 8 hours consistently for at least 3 days then as needed to help reduce swelling and for pain management related to a contusion, contusions typically will heal with time, may take up to 2 to 3 weeks but pain should persistently improve  You may use ice or heat over the affected area in 10 to 15-minute intervals  May elevate when sitting and lying on pillows to help reduce swelling and for general support  For any concerns regarding healing you may follow-up with your primary doctor or urgent care for reevaluation as needed

## 2022-06-14 NOTE — ED Triage Notes (Signed)
Pt reports pain and redness to Rt lower leg . Sx's started on Friday.

## 2022-06-14 NOTE — ED Provider Notes (Signed)
[facility]    CSN: [id number] Arrival date & time: 06/14/22  1004      History   Chief Complaint Chief Complaint  Patient presents with   Leg Pain    HPI Natalie Vazquez is a 62 y.o. female.   Patient presents for evaluation of erythema, swelling and pain present to the right lower extremity for 2 days.  Symptoms worsening today pain has become more severe.  Denies injury or trauma, numbness or tingling.  Able to bear weight and complete range of motion.  Has not attempted treatment.    Past Medical History:  Diagnosis Date   Allergic rhinitis due to other allergen    Allergy    seasonal   Anemia    Arthritis    Asthma    mild, intermittent   Chicken pox    Cholelithiasis    CTS (carpal tunnel syndrome)    Diabetes mellitus without complication (HCC)    Fibroids    Fibromyalgia    Frequent urination    GERD (gastroesophageal reflux disease)    Headache(784.0)    Migraine    PONV (postoperative nausea and vomiting)    difficulty waking up   Problems related to high-risk sexual behavior    Unprotected sex   Symptomatic menopausal or female climacteric states     Patient Active Problem List   Diagnosis Date Noted   Generalized headaches 01/22/2021   Food allergy 01/10/2020   Pollen-food allergy 01/10/2020   Mild intermittent asthma without complication [id number]   Herpes simplex type 1 infection 06/01/2019   Morbid obesity due to excess calories ([place]) 05/02/2019   Iron deficiency 05/02/2019   Obesity (BMI 30-39.9) 08/19/2018   Pre-diabetes 02/18/2018   OSA (obstructive sleep apnea) 05/28/2017   Infection due to trichomonas 11/25/2016   Tinnitus aurium, 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   Family history of breast cancer 12/25/2015   Migraines 09/28/2013   Visit for preventive health examination 09/10/2013   GERD (gastroesophageal reflux disease) 03/19/2013   Seasonal and perennial allergic rhinoconjunctivitis 03/19/2013   Neck pain, chronic 03/19/2013    IBS (irritable bowel syndrome) 03/19/2013    Past Surgical History:  Procedure Laterality Date   ABDOMINAL HYSTERECTOMY  2008   partial, ovaries remain   CARPAL TUNNEL RELEASE Right 2015   CHOLECYSTECTOMY  05/19/2011   Procedure: LAPAROSCOPIC CHOLECYSTECTOMY;  Surgeon: Zenovia Jarred, MD;  Location: [facility];  Service: General;  Laterality: N/A;   LASER ABLATION CONDYLOMA CERVICAL / VULVAR     PERCUTANEOUS PINNING  01/22/2012   Procedure: PERCUTANEOUS PINNING EXTREMITY;  Surgeon: Marybelle Killings, MD;  Location: [facility];  Service: Orthopedics;  Laterality: Right;  Closed Reduction and Pinning right 5th Metacarpal Fracture   TUBAL LIGATION     WISDOM TOOTH EXTRACTION      OB History   No obstetric history on file.      Home Medications    Prior to Admission medications   Medication Sig Start Date End Date Taking? Authorizing Provider  cetirizine (ZYRTEC ALLERGY) 10 MG tablet Take 1 tablet (10 mg total) by mouth daily. 01/10/20  Yes Garnet Sierras, DO  Cholecalciferol (VITAMIN D-3) 25 MCG (1000 UT) CAPS Take 1 capsule (1,000 Units total) by mouth daily. 06/20/19  Yes Brunetta Jeans, PA-C  cyclobenzaprine (FLEXERIL) 10 MG tablet 1 tablet PO QHS PRN 09/27/18  Yes Brunetta Jeans, PA-C  Olopatadine HCl 0.2 % SOLN Apply 1 drop to eye daily as needed (itchy/watery eyes). 01/10/20  Yes  Garnet Sierras, DO  SUMAtriptan (IMITREX) 100 MG tablet Take one tablet by mouth onset of headache, May repeat in 2 hours if headache persists or recurs. 06/20/19  Yes Brunetta Jeans, PA-C  Barberry-Oreg Grape-Goldenseal (BERBERINE COMPLEX PO) Take by mouth.    [provider]  Bee Pollen 1000 MG TABS Take 1 tablet by mouth.    [provider]  Biotin 1000 MCG tablet Take 1,000 mcg by mouth daily. Three times a week    [provider]  CONTRAVE 8-90 MG TB12 Take 2 tablets by mouth 2 (two) times daily. 02/22/20   [provider]  Diclofenac Sodium 3 % GEL Apply thin layer two to three  times daily over both areas of knee pain. 08/08/21   Allwardt, Randa Evens, PA-C  EPINEPHrine 0.3 mg/0.3 mL IJ SOAJ injection Inject 0.3 mg into the muscle as needed for anaphylaxis. 01/22/21   Garnet Sierras, DO  levalbuterol [facility] HFA) 45 MCG/ACT inhaler INHALE 1 TO 2 PUFFS BY MOUTH EVERY 6 HOURS AS NEEDED FOR WHEEZING 12/29/18   Brunetta Jeans, PA-C  NON FORMULARY Take 3 tablets by mouth as needed. Swiss Kriss Herbal Laxative Tab    [provider]  NON FORMULARY Golden Thread Suprene 60-50 one time a day    [provider]  NON FORMULARY Melia supreme 445 mg  one time a day    [provider]  NON FORMULARY Neuro - mag L-T one a day    [provider]  OLIVE LEAF PO Take by mouth.    [provider]  pantoprazole (PROTONIX) 20 MG tablet Take by mouth. 09/12/19   [provider]  Turmeric 500 MG CAPS Take 1 tablet by mouth daily.    [provider]  vitamin E 180 MG (400 UNITS) capsule Take 1 capsule by mouth daily.    [provider]  zonisamide (ZONEGRAN) 25 MG capsule Take 1 capsule by mouth daily. 03/06/16   [provider]    Family History Family History  Problem Relation Age of Onset   Diabetes Mother        Deceased   Hypertension Mother    Anemia Mother    Hyperlipidemia Mother    Arthritis/Rheumatoid Mother    Migraines Mother    CVA Mother    Hypertension Father        Deceased   Stroke Father    Diabetes Father        Legs Amputated   Heart disease Father    Arthritis Father    Cancer Father    Heart attack Maternal Aunt    Diabetes Maternal Uncle    Migraines Maternal Aunt    Asthma Maternal Aunt    Asthma Maternal Uncle    Stomach cancer Sister    Breast cancer Sister    Uterine cancer Sister        or Ovarian   Sleep apnea Brother    COPD Brother    Asthma Sister        x2   Sudden death Brother        SIDS   Hypertension Brother    Diabetes Sister    Asthma Daughter         x2   Allergies Daughter    Sickle cell trait Other        All Children   Atrial fibrillation Daughter    Anxiety disorder Daughter    Allergies Son  Hypertension Sister    Heart disease Sister    Asthma Brother     Social History Social History   Tobacco Use   Smoking status: Never   Smokeless tobacco: Never  Vaping Use   Vaping Use: Never used  Substance Use Topics   Alcohol use: Yes    Comment: rarely    Drug use: No     Allergies   Iodine, Shellfish allergy, and Latex   Review of Systems Review of Systems   Physical Exam Triage Vital Signs ED Triage Vitals  Enc Vitals Group     BP 06/14/22 1025 110/77     Pulse Rate 06/14/22 1025 85     Resp 06/14/22 1025 16     Temp 06/14/22 1025 97.9 F (36.6 C)     Temp src --      SpO2 06/14/22 1025 96 %     Weight --      Height --      Head Circumference --      Peak Flow --      Pain Score 06/14/22 1021 4     Pain Loc --      Pain Edu? --      Excl. in [place]? --    No data found.  Updated Vital Signs BP 110/77   Pulse 85   Temp 97.9 F (36.6 C)   Resp 16   SpO2 96%   Visual Acuity Right Eye Distance:   Left Eye Distance:   Bilateral Distance:    Right Eye Near:   Left Eye Near:    Bilateral Near:     Physical Exam Constitutional:      Appearance: Normal appearance.  Eyes:     Extraocular Movements: Extraocular movements intact.  Pulmonary:     Effort: Pulmonary effort is normal.  Musculoskeletal:     Comments: Mild erythema, warmth to the skin and tenderness is present over the lateral aspect of the right lower extremity without ecchymosis or injury to the skin, 2+ dorsalis pedis pulse and brachial pulse, able to bear weight  Neurological:     Mental Status: She is alert and oriented to person, place, and time. Mental status is at baseline.      UC Treatments / Results  Labs (all labs ordered are listed, but only abnormal results are displayed) Labs Reviewed - No data to  display  EKG   Radiology No results found.  Procedures Procedures (including critical care time)  Medications Ordered in UC Medications - No data to display  Initial Impression / Assessment and Plan / UC Course  I have reviewed the triage vital signs and the nursing notes.  Pertinent labs & imaging results that were available during my care of the patient were reviewed by me and considered in my medical decision making (see chart for details).  Right leg pain  Etiology is infection versus contusion, discussed with patient, will prophylactically treat for both, doxycycline and ibuprofen 800 mg sent to pharmacy, discussed supportive measures through ice, heat and elevation, given precautions that if symptoms persist past use of medication or if she has any recurrent concerns regarding healing to follow-up for reevaluation with primary doctor or urgent care as needed Final Clinical Impressions(s) / UC Diagnoses   Final diagnoses:  None   Discharge Instructions   None    ED Prescriptions   None    PDMP not reviewed this encounter.   Hans Eden, NP 06/14/22 1051

## 2022-07-02 ENCOUNTER — Ambulatory Visit: Payer: Federal, State, Local not specified - PPO | Admitting: Podiatry

## 2022-07-02 ENCOUNTER — Encounter: Payer: Self-pay | Admitting: Podiatry

## 2022-07-02 DIAGNOSIS — L600 Ingrowing nail: Secondary | ICD-10-CM | POA: Diagnosis not present

## 2022-07-02 NOTE — Progress Notes (Signed)
Subjective:   Patient ID: Natalie Vazquez, female   DOB: 62 y.o.   MRN: [id number]   HPI Patient presents with painful ingrown toenail left big toe for a fairly long time worse over the last month.  States that it comes and goes but can be very sore with no redness or drainage that she sees   ROS      Objective:  Physical Exam  Neurovascular status intact muscle strength was found to be adequate range of motion within normal limits with patient noted to have incurvation of the left hallux medial border that is very painful when pressed no active drainage no redness associated with it.       Assessment:  Significant ingrown toenail deformity left hallux medial border with pain     Plan:  H&P reviewed recommended correction of deformity explained procedure risk and patient wants surgery.  I explained procedure and allowed her to read consent form after review and today I infiltrated the left hallux 60 mg Xylocaine Marcaine mixture sterile prep done using sterile instrumentation remove the medial border exposed matrix applied phenol 3 applications 30 seconds followed by alcohol lavage sterile dressing gave instructions on soaks and to wear dressing 24 hours take it off earlier if throbbing were to occur and encouraged her to call with questions concerns which may arise

## 2022-07-02 NOTE — Patient Instructions (Signed)

## 2022-07-06 ENCOUNTER — Telehealth: Payer: Self-pay | Admitting: Podiatry

## 2022-07-06 ENCOUNTER — Other Ambulatory Visit (INDEPENDENT_AMBULATORY_CARE_PROVIDER_SITE_OTHER): Payer: Federal, State, Local not specified - PPO

## 2022-07-06 ENCOUNTER — Ambulatory Visit: Payer: Federal, State, Local not specified - PPO | Admitting: Orthopedic Surgery

## 2022-07-06 ENCOUNTER — Encounter: Payer: Self-pay | Admitting: Orthopedic Surgery

## 2022-07-06 DIAGNOSIS — M25562 Pain in left knee: Secondary | ICD-10-CM | POA: Diagnosis not present

## 2022-07-06 DIAGNOSIS — G8929 Other chronic pain: Secondary | ICD-10-CM

## 2022-07-06 DIAGNOSIS — M25532 Pain in left wrist: Secondary | ICD-10-CM

## 2022-07-06 NOTE — Progress Notes (Signed)
Office Visit Note   Patient: Natalie Vazquez           Date of Birth: [birth year]           MRN: [id number] Visit Date: 07/06/2022              Requested by: Allwardt, Randa Evens, PA-C [place],  [place] [address] PCP: Fredirick Lathe, PA-C  Chief Complaint  Patient presents with   Right Upper Arm - Pain   Left Knee - Pain      HPI: Patient is a 62 year old woman who presents complaining of left knee pain that she states is getting worse also circumferential left wrist pain.  She denies any radicular symptoms from the wrist into the fingers no numbness in the fingers.  Patient states she has tried doing strengthening exercises for her knees.  Assessment & Plan: Visit Diagnoses:  1. Chronic pain of left knee   2. Pain in left wrist     Plan: Recommended Aleve 2 p.o. twice daily for the arthritic symptoms as well as continuing strengthening.  Follow-Up Instructions: Return if symptoms worsen or fail to improve.   Ortho Exam  Patient is alert, oriented, no adenopathy, well-dressed, normal affect, normal respiratory effort. Examination of the left wrist there is no tenderness to palpation of the scaphoid scapholunate or TFCC.  The first dorsal extensor compartment is not tender.  Patient has no median nerve entrapment symptoms.  Flexion of the wrist does not reproduce any pain palpation over the transverse carpal ligament does not reproduce pain.  Examination the left knee there is no effusion there is crepitation with range of motion she does have some tenderness to palpation of the medial joint line collaterals and cruciates are stable.  Imaging: XR Knee 1-2 Views Left  Result Date: 07/06/2022 2 view radiographs of the left knee shows congruent alignment equal to the right knee.  There is no subcondylar cysts or sclerosis.  No periarticular bony spurs.  XR Wrist 2 Views Left  Result Date: 07/06/2022 2 view radiographs of the left wrist shows no bony  abnormalities no scapholunate interval widening.  No ulnar positive variance.  No images are attached to the encounter.  Labs: Lab Results  Component Value Date   HGBA1C 5.8 (A) 11/11/2021   HGBA1C 6.1 08/08/2021   HGBA1C 6.4 01/01/2021   ESRSEDRATE 17 05/30/2020   ESRSEDRATE 1 09/07/2013   LABURIC 5.0 05/30/2020   LABORGA CORYNEBACTERIUM SPECIES 01/09/2016     Lab Results  Component Value Date   ALBUMIN 4.3 08/08/2021   ALBUMIN 4.0 01/01/2021   ALBUMIN 4.2 05/30/2020    No results found for: "MG" Lab Results  Component Value Date   VD25OH 19.84 (L) 05/28/2017    No results found for: "PREALBUMIN"    Latest Ref Rng & Units 01/01/2021   10:18 AM 05/30/2020   11:38 AM 02/10/2019    8:43 AM  CBC EXTENDED  WBC 4.0 - 10.5 K/uL 5.2  4.5  5.2   RBC 3.87 - 5.11 Mil/uL 4.89  4.93  4.74   Hemoglobin 12.0 - 15.0 g/dL 12.6  12.8  11.9   HCT 36.0 - 46.0 % 39.0  39.4  36.9   Platelets 150.0 - 400.0 K/uL 282.0  217.0  229.0   NEUT# 1.4 - 7.7 K/uL 2.2  1.9  2.3   Lymph# 0.7 - 4.0 K/uL 2.5  2.2  2.2      There is no height or  weight on file to calculate BMI.  Orders:  Orders Placed This Encounter  Procedures   XR Knee 1-2 Views Left   XR Wrist 2 Views Left   No orders of the defined types were placed in this encounter.    Procedures: No procedures performed  Clinical Data: No additional findings.  ROS:  All other systems negative, except as noted in the HPI. Review of Systems  Objective: Vital Signs: There were no vitals taken for this visit.  Specialty Comments:  No specialty comments available.  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   Family history of breast cancer 12/25/2015   Migraines 09/28/2013   Visit for preventive health examination 09/10/2013   GERD (gastroesophageal reflux disease) 03/19/2013   Seasonal and perennial allergic rhinoconjunctivitis 03/19/2013   Neck pain, chronic 03/19/2013   IBS (irritable bowel syndrome) 03/19/2013   Past Medical History:  Diagnosis Date   Allergic rhinitis due to other allergen    Allergy    seasonal   Anemia    Arthritis    Asthma    mild, intermittent   Chicken pox    Cholelithiasis    CTS (carpal tunnel syndrome)    Diabetes mellitus without complication (HCC)    Fibroids    Fibromyalgia    Frequent urination    GERD (gastroesophageal reflux disease)    Headache(784.0)    Migraine    PONV (postoperative nausea and vomiting)    difficulty waking up   Problems related to high-risk sexual behavior    Unprotected sex   Symptomatic menopausal or female climacteric states     Family History  Problem Relation Age of Onset   Diabetes Mother        Deceased   Hypertension Mother    Anemia Mother    Hyperlipidemia Mother    Arthritis/Rheumatoid Mother    Migraines Mother    CVA Mother    Hypertension Father        Deceased   Stroke Father    Diabetes Father        Legs Amputated   Heart disease Father    Arthritis Father    Cancer Father    Heart attack Maternal Aunt    Diabetes Maternal Uncle    Migraines Maternal Aunt    Asthma Maternal Aunt    Asthma Maternal Uncle    Stomach cancer Sister    Breast cancer Sister    Uterine cancer Sister        or Ovarian   Sleep apnea Brother    COPD Brother    Asthma Sister        x2   Sudden death Brother        SIDS   Hypertension Brother    Diabetes Sister    Asthma Daughter        x2   Allergies Daughter    Sickle cell  trait Other        All Children   Atrial fibrillation Daughter    Anxiety disorder  Daughter    Allergies Son    Hypertension Sister    Heart disease Sister    Asthma Brother     Past Surgical History:  Procedure Laterality Date   ABDOMINAL HYSTERECTOMY  2008   partial, ovaries remain   CARPAL TUNNEL RELEASE Right 2015   CHOLECYSTECTOMY  05/19/2011   Procedure: LAPAROSCOPIC CHOLECYSTECTOMY;  Surgeon: Zenovia Jarred, MD;  Location: [facility];  Service: General;  Laterality: N/A;   LASER ABLATION [place] / VULVAR     PERCUTANEOUS PINNING  01/22/2012   Procedure: PERCUTANEOUS PINNING EXTREMITY;  Surgeon: Marybelle Killings, MD;  Location: [facility];  Service: Orthopedics;  Laterality: Right;  Closed Reduction and Pinning right 5th Metacarpal Fracture   TUBAL LIGATION     WISDOM TOOTH EXTRACTION     Social History   Occupational History   Not on file  Tobacco Use   Smoking status: Never   Smokeless tobacco: Never  Vaping Use   Vaping Use: Never used  Substance and Sexual Activity   Alcohol use: Yes    Comment: rarely    Drug use: No   Sexual activity: Never    Birth control/protection: None, Abstinence, Surgical

## 2022-07-06 NOTE — Telephone Encounter (Signed)
Patient left message on voicemail that she needs a boot or surgical shoes, she just had nail removed and her foot is extremely swollen.  Please advise

## 2022-07-08 NOTE — Telephone Encounter (Signed)
Attempted to contact the patient,no answer and no vm set up.

## 2022-07-08 NOTE — Telephone Encounter (Signed)
Surgical she should be fine

## 2022-11-08 ENCOUNTER — Emergency Department (HOSPITAL_BASED_OUTPATIENT_CLINIC_OR_DEPARTMENT_OTHER): Payer: Federal, State, Local not specified - PPO

## 2022-11-08 ENCOUNTER — Emergency Department (HOSPITAL_BASED_OUTPATIENT_CLINIC_OR_DEPARTMENT_OTHER)
Admission: EM | Admit: 2022-11-08 | Discharge: 2022-11-08 | Disposition: A | Discharge status: Home / Self Care | Payer: Federal, State, Local not specified - PPO | Attending: Emergency Medicine | Admitting: Emergency Medicine

## 2022-11-08 ENCOUNTER — Encounter (HOSPITAL_BASED_OUTPATIENT_CLINIC_OR_DEPARTMENT_OTHER): Payer: Self-pay | Admitting: Emergency Medicine

## 2022-11-08 ENCOUNTER — Other Ambulatory Visit: Payer: Self-pay

## 2022-11-08 DIAGNOSIS — Z91040 Latex allergy status: Secondary | ICD-10-CM | POA: Diagnosis not present

## 2022-11-08 DIAGNOSIS — W28XXXA Contact with powered lawn mower, initial encounter: Secondary | ICD-10-CM | POA: Diagnosis not present

## 2022-11-08 DIAGNOSIS — J4521 Mild intermittent asthma with (acute) exacerbation: Secondary | ICD-10-CM | POA: Insufficient documentation

## 2022-11-08 DIAGNOSIS — Z23 Encounter for immunization: Secondary | ICD-10-CM | POA: Diagnosis not present

## 2022-11-08 DIAGNOSIS — S6991XA Unspecified injury of right wrist, hand and finger(s), initial encounter: Secondary | ICD-10-CM | POA: Diagnosis not present

## 2022-11-08 DIAGNOSIS — E119 Type 2 diabetes mellitus without complications: Secondary | ICD-10-CM | POA: Diagnosis not present

## 2022-11-08 DIAGNOSIS — S61212A Laceration without foreign body of right middle finger without damage to nail, initial encounter: Secondary | ICD-10-CM | POA: Diagnosis not present

## 2022-11-08 MED ORDER — TETANUS-DIPHTH-ACELL PERTUSSIS 5-2.5-18.5 LF-MCG/0.5 IM SUSY
0.5000 mL | PREFILLED_SYRINGE | Freq: Once | INTRAMUSCULAR | Status: AC
  Administered 2022-11-08: 0.5 mL via INTRAMUSCULAR
  Filled 2022-11-08: qty 0.5

## 2022-11-08 MED ORDER — ACETAMINOPHEN 325 MG PO TABS
650.0000 mg | ORAL_TABLET | Freq: Once | ORAL | Status: AC
  Administered 2022-11-08: 650 mg via ORAL
  Filled 2022-11-08: qty 2

## 2022-11-08 MED ORDER — CEPHALEXIN 500 MG PO CAPS: 500.0000 mg | ORAL_CAPSULE | Freq: Four times a day (QID) | ORAL | 0 refills | Status: DC

## 2022-11-08 NOTE — ED Notes (Addendum)
Avulsion of distal tip below nail of R middle finger, no nail involvement, no active bleeding, bleeding controlled with light sterile gauze dressing. Per chart, last Tdap 2014, due in December

## 2022-11-08 NOTE — Discharge Instructions (Addendum)
Please take your antibiotics as prescribed. Use bacitracin ointment to prevent infection. Take tylenol/ibuprofen for pain. I recommend close follow-up with PCP for reevaluation.  Please do not hesitate to return to emergency department if worrisome signs symptoms we discussed become apparent.

## 2022-11-08 NOTE — ED Provider Notes (Signed)
[place] [facility] Provider Note   CSN: [id number] Arrival date & time: 11/08/22  1531     History  Chief Complaint  Patient presents with   Finger Injury    Natalie Vazquez is a 62 y.o. female history of diabetes presents today for evaluation of finger laceration.  Patient states the skin on the tip of her left finger was cut off while running a lawnmower around 11 today.  Bleeding is controlled in triage.  Patient denies any loss of sensation to her right hand.  Last tetanus was in 2014.  HPI    Past Medical History:  Diagnosis Date   Allergic rhinitis due to other allergen    Allergy    seasonal   Anemia    Arthritis    Asthma    mild, intermittent   Chicken pox    Cholelithiasis    CTS (carpal tunnel syndrome)    Diabetes mellitus without complication (HCC)    Fibroids    Fibromyalgia    Frequent urination    GERD (gastroesophageal reflux disease)    Headache(784.0)    Migraine    PONV (postoperative nausea and vomiting)    difficulty waking up   Problems related to high-risk sexual behavior    Unprotected sex   Symptomatic menopausal or female climacteric states    Past Surgical History:  Procedure Laterality Date   ABDOMINAL HYSTERECTOMY  2008   partial, ovaries remain   CARPAL TUNNEL RELEASE Right 2015   CHOLECYSTECTOMY  05/19/2011   Procedure: LAPAROSCOPIC CHOLECYSTECTOMY;  Surgeon: Liz Malady, MD;  Location: [facility] OR;  Service: General;  Laterality: N/A;   LASER ABLATION CONDYLOMA CERVICAL / VULVAR     PERCUTANEOUS PINNING  01/22/2012   Procedure: PERCUTANEOUS PINNING EXTREMITY;  Surgeon: Eldred Manges, MD;  Location: [facility] OR;  Service: Orthopedics;  Laterality: Right;  Closed Reduction and Pinning right 5th Metacarpal Fracture   TUBAL LIGATION     WISDOM TOOTH EXTRACTION       Home Medications Prior to Admission medications   Medication Sig Start Date End Date Taking? Authorizing Provider  Barberry-Oreg  Grape-Goldenseal (BERBERINE COMPLEX PO) Take by mouth.    [provider]  Bee Pollen 1000 MG TABS Take 1 tablet by mouth.    [provider]  Biotin 1000 MCG tablet Take 1,000 mcg by mouth daily. Three times a week    [provider]  cetirizine (ZYRTEC ALLERGY) 10 MG tablet Take 1 tablet (10 mg total) by mouth daily. 01/10/20   Ellamae Sia, DO  Cholecalciferol (VITAMIN D-3) 25 MCG (1000 UT) CAPS Take 1 capsule (1,000 Units total) by mouth daily. 06/20/19   Waldon Merl, PA-C  CONTRAVE 8-90 MG TB12 Take 2 tablets by mouth 2 (two) times daily. 02/22/20   [provider]  cyclobenzaprine (FLEXERIL) 10 MG tablet 1 tablet PO QHS PRN 09/27/18   Waldon Merl, PA-C  Diclofenac Sodium 3 % GEL Apply thin layer two to three times daily over both areas of knee pain. 08/08/21   Allwardt, Crist Infante, PA-C  doxycycline (VIBRAMYCIN) 100 MG capsule Take 1 capsule (100 mg total) by mouth 2 (two) times daily. 06/14/22   White, Elita Boone, NP  EPINEPHrine 0.3 mg/0.3 mL IJ SOAJ injection Inject 0.3 mg into the muscle as needed for anaphylaxis. 01/22/21   Ellamae Sia, DO  ibuprofen (ADVIL) 800 MG tablet Take 1 tablet (800 mg total) by mouth every 8 (  eight) hours as needed. 06/14/22   Valinda Hoar, NP  levalbuterol (XOPENEX HFA) 45 MCG/ACT inhaler INHALE 1 TO 2 PUFFS BY MOUTH EVERY 6 HOURS AS NEEDED FOR WHEEZING 12/29/18   Waldon Merl, PA-C  NON FORMULARY Take 3 tablets by mouth as needed. Swiss Kriss Herbal Laxative Tab    [provider]  NON FORMULARY Golden Thread Suprene 60-50 one time a day    [provider]  NON FORMULARY Melia supreme 445 mg  one time a day    [provider]  NON FORMULARY Neuro - mag L-T one a day    [provider]  OLIVE LEAF PO Take by mouth.    [provider]  Olopatadine HCl 0.2 % SOLN Apply 1 drop to eye daily as needed (itchy/watery eyes). 01/10/20   Ellamae Sia, DO  pantoprazole (PROTONIX) 20 MG  tablet Take by mouth. 09/12/19   [provider]  SUMAtriptan (IMITREX) 100 MG tablet Take one tablet by mouth onset of headache, May repeat in 2 hours if headache persists or recurs. 06/20/19   Waldon Merl, PA-C  Turmeric 500 MG CAPS Take 1 tablet by mouth daily.    [provider]  vitamin E 180 MG (400 UNITS) capsule Take 1 capsule by mouth daily.    [provider]  zonisamide (ZONEGRAN) 25 MG capsule Take 1 capsule by mouth daily. 03/06/16   [provider]      Allergies    Iodine, Shellfish allergy, and Latex    Review of Systems   Review of Systems Negative except as per HPI.  Physical Exam Updated Vital Signs BP 116/71   Pulse (!) 57   Temp 98.5 F (36.9 C) (Oral)   Resp 18   Ht 5' 6.75" (1.695 m)   Wt 93.6 kg   SpO2 100%   BMI 32.57 kg/m  Physical Exam Vitals and nursing note reviewed.  Constitutional:      Appearance: Normal appearance.  HENT:     Head: Normocephalic and atraumatic.     Mouth/Throat:     Mouth: Mucous membranes are moist.  Eyes:     General: No scleral icterus. Cardiovascular:     Rate and Rhythm: Normal rate and regular rhythm.     Pulses: Normal pulses.     Heart sounds: Normal heart sounds.  Pulmonary:     Effort: Pulmonary effort is normal.     Breath sounds: Normal breath sounds.  Abdominal:     General: Abdomen is flat.     Palpations: Abdomen is soft.     Tenderness: There is no abdominal tenderness.  Musculoskeletal:        General: No deformity.     Comments: Avulsion of skin and some soft tissue at that tip of the right middle finger.  No nail bed involvement.  No active bleeding.  Skin:    General: Skin is warm.     Findings: No rash.  Neurological:     General: No focal deficit present.     Mental Status: She is alert.  Psychiatric:        Mood and Affect: Mood normal.     ED Results / Procedures / Treatments   Labs (all labs ordered are listed, but only abnormal results are  displayed) Labs Reviewed - No data to display  EKG None  Radiology DG Finger Middle Right  Result Date: 11/08/2022 CLINICAL DATA:  Soft tissue injury. EXAM: RIGHT MIDDLE FINGER  3V COMPARISON:  None Available. FINDINGS: Soft tissue irregularity along the distal aspect of the finger, third digit. Overlapping bandage. No radiopaque foreign body. No fracture or dislocation. Preserved joint spaces and bone mineralization. IMPRESSION: Distal tip soft tissue abnormality of the third digit. No acute underlying osseous abnormality. Electronically Signed   By: Karen Kays M.D.   On: 11/08/2022 16:28    Procedures Procedures    Medications Ordered in ED Medications  Tdap (BOOSTRIX) injection 0.5 mL (has no administration in time range)  acetaminophen (TYLENOL) tablet 650 mg (has no administration in time range)    ED Course/ Medical Decision Making/ A&P                             Medical Decision Making Amount and/or Complexity of Data Reviewed Radiology: ordered.  Risk OTC drugs. Prescription drug management.   This patient presents to the ED for finger laceration, this involves an extensive number of treatment options, and is a complaint that carries with a high risk of complications and morbidity.  The differential diagnosis includes skin abrasion, laceration, fracture, dislocation.  This is not an exhaustive list.  Imaging studies: I ordered imaging studies, personally reviewed, interpreted imaging and agree with the radiologist's interpretations. The results include: X-ray of the right middle finger show no fracture or dislocation.  Problem list/ ED course/ Critical interventions/ Medical management: HPI: See above Vital signs within normal range and stable throughout visit. Laboratory/imaging studies significant for: See above. On physical examination, patient is afebrile and appears in no acute distress.  There was avulsion of skin and some soft tissue at the tip of the right  middle finger.  No nailbed involvement.  No active bleeding.  Wound is irrigated copiously with about 50 cc of antiseptic solution.  Bacitracin applied and wound dressing performed by nursing staff.  Bleeding is controlled.  Tetanus shot given.  Due to history of diabetes, will send Rx of Keflex. Wound care instruction given.  Advised patient to take Tylenol and ibuprofen for pain, take the wound dry and clean follow-up with primary care physician as needed.  Strict return precaution discussed. I have reviewed the patient home medicines and have made adjustments as needed.  Cardiac monitoring/EKG: The patient was maintained on a cardiac monitor.  I personally reviewed and interpreted the cardiac monitor which showed an underlying rhythm of: sinus rhythm.  Additional history obtained: External records from outside source obtained and reviewed including: Chart review including previous notes, labs, imaging.  Consultations obtained:  Disposition Continued outpatient therapy. Follow-up with PCP recommended for reevaluation of symptoms. Treatment plan discussed with patient.  Pt acknowledged understanding was agreeable to the plan. Worrisome signs and symptoms were discussed with patient, and patient acknowledged understanding to return to the ED if they noticed these signs and symptoms. Patient was stable upon discharge.   This chart was dictated using voice recognition software.  Despite best efforts to proofread,  errors can occur which can change the documentation meaning.          Final Clinical Impression(s) / ED Diagnoses Final diagnoses:  Laceration of right middle finger without foreign body, nail damage status unspecified, initial encounter    Rx / DC Orders ED Discharge Orders          Ordered    cephALEXin (KEFLEX) 500 MG capsule  4 times daily        11/08/22 1725  Jeanelle Malling, PA 11/08/22 1731    Ernie Avena, MD 11/08/22 [phone]

## 2022-11-08 NOTE — ED Notes (Signed)
EDPA into room 

## 2022-11-08 NOTE — ED Triage Notes (Signed)
Pt with RT middle finger injury; tip was cut off by running lawnmower around 1100

## 2022-11-20 ENCOUNTER — Ambulatory Visit: Payer: Federal, State, Local not specified - PPO | Admitting: Physician Assistant

## 2022-11-23 ENCOUNTER — Encounter: Payer: Self-pay | Admitting: Physician Assistant

## 2022-11-23 ENCOUNTER — Encounter (HOSPITAL_BASED_OUTPATIENT_CLINIC_OR_DEPARTMENT_OTHER): Payer: Self-pay

## 2022-11-23 ENCOUNTER — Emergency Department (HOSPITAL_BASED_OUTPATIENT_CLINIC_OR_DEPARTMENT_OTHER)
Admission: EM | Admit: 2022-11-23 | Discharge: 2022-11-23 | Disposition: A | Discharge status: Home / Self Care | Payer: Federal, State, Local not specified - PPO

## 2022-11-23 ENCOUNTER — Other Ambulatory Visit: Payer: Self-pay

## 2022-11-23 ENCOUNTER — Emergency Department (HOSPITAL_BASED_OUTPATIENT_CLINIC_OR_DEPARTMENT_OTHER): Payer: Federal, State, Local not specified - PPO

## 2022-11-23 DIAGNOSIS — Z4801 Encounter for change or removal of surgical wound dressing: Secondary | ICD-10-CM | POA: Diagnosis not present

## 2022-11-23 DIAGNOSIS — Z4800 Encounter for change or removal of nonsurgical wound dressing: Secondary | ICD-10-CM | POA: Insufficient documentation

## 2022-11-23 DIAGNOSIS — E119 Type 2 diabetes mellitus without complications: Secondary | ICD-10-CM | POA: Diagnosis not present

## 2022-11-23 DIAGNOSIS — Z91040 Latex allergy status: Secondary | ICD-10-CM | POA: Diagnosis not present

## 2022-11-23 DIAGNOSIS — Z5189 Encounter for other specified aftercare: Secondary | ICD-10-CM

## 2022-11-23 DIAGNOSIS — Z0389 Encounter for observation for other suspected diseases and conditions ruled out: Secondary | ICD-10-CM | POA: Diagnosis not present

## 2022-11-23 NOTE — ED Triage Notes (Signed)
Pt reports:  Right hand wound Sent by PCP Right middle finger

## 2022-11-23 NOTE — ED Notes (Signed)
ED Provider at bedside. 

## 2022-11-23 NOTE — ED Notes (Signed)
Patient transported to X-ray 

## 2022-11-23 NOTE — ED Provider Notes (Signed)
[place] [facility] Provider Note   CSN: [id number] Arrival date & time: 11/23/22  1319     History  Chief Complaint  Patient presents with   Wound Check    Natalie Vazquez is a 62 y.o. female, history of diabetes, who presents to the ED secondary for wound evaluation.  She states about 2 weeks ago, she had an issue with a lawnmower, when the tip of her anger got chopped off, will when she was running the lawnmower.  She notes that she went to the ER, to be seen, received a tetanus shot, and has had no problems.  The area has been healing, and thickening, and now is turning slightly black.  She denies any coolness, pain that is out of proportion to the site, or foul-smelling drainage.  She completed the Keflex as instructed.  She went to her PCP today, and was told to come to the ER to see if her finger is dying.    Home Medications Prior to Admission medications   Medication Sig Start Date End Date Taking? Authorizing Provider  Barberry-Oreg Grape-Goldenseal (BERBERINE COMPLEX PO) Take by mouth.    [provider]  Bee Pollen 1000 MG TABS Take 1 tablet by mouth.    [provider]  Biotin 1000 MCG tablet Take 1,000 mcg by mouth daily. Three times a week    [provider]  cephALEXin (KEFLEX) 500 MG capsule Take 1 capsule (500 mg total) by mouth 4 (four) times daily. 11/08/22   Jeanelle Malling, PA  cetirizine (ZYRTEC ALLERGY) 10 MG tablet Take 1 tablet (10 mg total) by mouth daily. 01/10/20   Ellamae Sia, DO  Cholecalciferol (VITAMIN D-3) 25 MCG (1000 UT) CAPS Take 1 capsule (1,000 Units total) by mouth daily. 06/20/19   Waldon Merl, PA-C  CONTRAVE 8-90 MG TB12 Take 2 tablets by mouth 2 (two) times daily. 02/22/20   [provider]  cyclobenzaprine (FLEXERIL) 10 MG tablet 1 tablet PO QHS PRN 09/27/18   Waldon Merl, PA-C  Diclofenac Sodium 3 % GEL Apply thin layer two to three times daily over both areas of knee  pain. 08/08/21   Allwardt, Crist Infante, PA-C  doxycycline (VIBRAMYCIN) 100 MG capsule Take 1 capsule (100 mg total) by mouth 2 (two) times daily. 06/14/22   White, Elita Boone, NP  EPINEPHrine 0.3 mg/0.3 mL IJ SOAJ injection Inject 0.3 mg into the muscle as needed for anaphylaxis. 01/22/21   Ellamae Sia, DO  ibuprofen (ADVIL) 800 MG tablet Take 1 tablet (800 mg total) by mouth every 8 (eight) hours as needed. 06/14/22   Valinda Hoar, NP  levalbuterol (XOPENEX HFA) 45 MCG/ACT inhaler INHALE 1 TO 2 PUFFS BY MOUTH EVERY 6 HOURS AS NEEDED FOR WHEEZING 12/29/18   Waldon Merl, PA-C  NON FORMULARY Take 3 tablets by mouth as needed. Swiss Kriss Herbal Laxative Tab    [provider]  NON FORMULARY Golden Thread Suprene 60-50 one time a day    [provider]  NON FORMULARY Melia supreme 445 mg  one time a day    [provider]  NON FORMULARY Neuro - mag L-T one a day    [provider]  OLIVE LEAF PO Take by mouth.    [provider]  Olopatadine HCl 0.2 % SOLN Apply 1 drop to eye daily as needed (itchy/watery eyes). 01/10/20   Ellamae Sia, DO  pantoprazole (PROTONIX) 20 MG  tablet Take by mouth. 09/12/19   [provider]  SUMAtriptan (IMITREX) 100 MG tablet Take one tablet by mouth onset of headache, May repeat in 2 hours if headache persists or recurs. 06/20/19   Waldon Merl, PA-C  Turmeric 500 MG CAPS Take 1 tablet by mouth daily.    [provider]  vitamin E 180 MG (400 UNITS) capsule Take 1 capsule by mouth daily.    [provider]  zonisamide (ZONEGRAN) 25 MG capsule Take 1 capsule by mouth daily. 03/06/16   [provider]      Allergies    Iodine, Shellfish allergy, and Latex    Review of Systems   Review of Systems  Constitutional:  Negative for fever.  Skin:  Positive for wound.    Physical Exam Updated Vital Signs BP 118/83 (BP Location: Left Arm)   Pulse 73   Temp 98.1 F (36.7 C) (Oral)   Resp  18   Ht 5\' 6"  (1.676 m)   Wt 93.4 kg   SpO2 97%   BMI 33.25 kg/m  Physical Exam Vitals and nursing note reviewed.  Constitutional:      General: She is not in acute distress.    Appearance: She is well-developed.  HENT:     Head: Normocephalic and atraumatic.  Eyes:     General:        Right eye: No discharge.        Left eye: No discharge.     Conjunctiva/sclera: Conjunctivae normal.  Pulmonary:     Effort: No respiratory distress.  Musculoskeletal:     Comments: Range of motion of all fingers intact.  Skin:    Comments: Patient has healing scab that is dark brown and light brown on the tip of the finger.  It is hardened, and keratinized.  No purulent drainage.  She has good sensation to this, and capillary refill less than 2 seconds.  No exquisite pain.   Neurological:     Mental Status: She is alert.     Comments: Clear speech.   Psychiatric:        Behavior: Behavior normal.        Thought Content: Thought content normal.     ED Results / Procedures / Treatments   Labs (all labs ordered are listed, but only abnormal results are displayed) Labs Reviewed - No data to display  EKG None  Radiology DG Finger Middle Right  Result Date: 11/23/2022 CLINICAL DATA:  Wound evaluation, check bony structure. Lawn mower injury 11/07/2021 (presumably 11/08/2022). EXAM: RIGHT MIDDLE FINGER 2+V COMPARISON:  Radiographs 11/08/2022 FINDINGS: Distal soft tissue irregularity of the long finger consistent with previous laceration, similar to prior radiographs. No foreign body, soft tissue emphysema or bone destruction to suggest osteomyelitis. There is no evidence of acute fracture or dislocation. The joint spaces appear preserved. IMPRESSION: No acute osseous findings or evidence of osteomyelitis. Stable distal soft tissue irregularity consistent with previous laceration. Electronically Signed   By: Carey Bullocks M.D.   On: 11/23/2022 15:04    Procedures Procedures    Medications  Ordered in ED Medications - No data to display  ED Course/ Medical Decision Making/ A&P                                 Medical Decision Making Patient is a 62 year old female, here for wound, on her right middle finger, after lawnmower injury about 2  weeks ago.  It appears like it is healing, and there is keratin present, or/scab, see picture below.  She has good capillary refill, neurologically intact.  No sensory issues.  Range of motion is within normal limits as well.  She is not tender to this area.  I believe that this is normal wound healing, however he we will obtain x-ray, to further evaluate for possible bone damage or gas.  Amount and/or Complexity of Data Reviewed Radiology: ordered.    Details: No evidence of osteomyelitis, no gas Discussion of management or test interpretation with external provider(s): Discussed with patient, x-ray reassuring.  I believe this likely represents normal wound healing, as her capillary refill is intact, her finger is not cold, and sensation is intact.  I advised her to follow-up with the wound center for further evaluation, and return precautions were emphasized.  I emphasized that if the area starts streaking down her finger, or there is worsening black area, return to the ER immediately.  If feels like keratin, or a scab, thus I think it is likely representing that.      Final Clinical Impression(s) / ED Diagnoses Final diagnoses:  Visit for wound check    Rx / DC Orders ED Discharge Orders     None         , Harley Alto, PA 11/23/22 1532    Coral Spikes, DO 11/24/22 [phone]

## 2022-11-23 NOTE — Discharge Instructions (Signed)
Your wound is healing at this time.  If the black area starts streaking down your fingers, you have worsening pain, or coolness of your fingers please return to the ER.  Please follow-up with the wound care center, as instructed, and your PCP.  Your x-ray was reassuring today

## 2022-11-25 ENCOUNTER — Ambulatory Visit: Payer: Federal, State, Local not specified - PPO | Admitting: Physician Assistant

## 2022-12-08 ENCOUNTER — Other Ambulatory Visit: Payer: Self-pay | Admitting: Physician Assistant

## 2022-12-08 ENCOUNTER — Telehealth: Payer: Self-pay | Admitting: Physician Assistant

## 2022-12-08 ENCOUNTER — Ambulatory Visit: Payer: Federal, State, Local not specified - PPO | Admitting: Physician Assistant

## 2022-12-08 VITALS — BP 118/70 | HR 73 | Temp 97.8°F | Ht 66.0 in | Wt 210.4 lb

## 2022-12-08 DIAGNOSIS — Z0000 Encounter for general adult medical examination without abnormal findings: Secondary | ICD-10-CM

## 2022-12-08 DIAGNOSIS — B3731 Acute candidiasis of vulva and vagina: Secondary | ICD-10-CM

## 2022-12-08 DIAGNOSIS — N8110 Cystocele, unspecified: Secondary | ICD-10-CM | POA: Diagnosis not present

## 2022-12-08 DIAGNOSIS — R7303 Prediabetes: Secondary | ICD-10-CM

## 2022-12-08 MED ORDER — FLUCONAZOLE 150 MG PO TABS
150.0000 mg | ORAL_TABLET | Freq: Every day | ORAL | 0 refills | Status: DC
Start: 2022-12-08 — End: 2023-05-17

## 2022-12-08 NOTE — Telephone Encounter (Signed)
Pt was advised to fu in 4 weeks for CPE. No availability until Dec. Pt needs to come back in for labs in 4 weeks. Please place lab order for diabetes

## 2022-12-08 NOTE — Progress Notes (Signed)
Subjective:    Patient ID: Natalie Vazquez, female    DOB: May 05, 1960, 62 y.o.   MRN: [id number]  Chief Complaint  Patient presents with   bladder issues    Pt states the bladder is coming around the pessori. causing yeast infections and very uncomfortable     HPI Patient is in today for bladder concerns. Last few years, but just "dealing with it." Getting worse and worse. Now to the point, the pessary is not holding bladder in any longer. Coughing = bladder and pessary coming out. Dr. Tonny Bollman was last GYN, hasn't seen them in awhile - fitted for pessary there and hasn't been rechecked.  Hx abdominal hysterectomy 2008 - fibroids.   Urinating - feels the urine trickling over the bladder that has fallen. Wiping, it's there. Sex life not existent because of the prolapse. No new urinary symptoms.  No signs of rectal prolapse per patient, but does have hemorrhoids.    Past Medical History:  Diagnosis Date   Allergic rhinitis due to other allergen    Allergy    seasonal   Anemia    Arthritis    Asthma    mild, intermittent   Chicken pox    Cholelithiasis    CTS (carpal tunnel syndrome)    Diabetes mellitus without complication (HCC)    Fibroids    Fibromyalgia    Frequent urination    GERD (gastroesophageal reflux disease)    Headache(784.0)    Migraine    PONV (postoperative nausea and vomiting)    difficulty waking up   Problems related to high-risk sexual behavior    Unprotected sex   Symptomatic menopausal or female climacteric states     Past Surgical History:  Procedure Laterality Date   ABDOMINAL HYSTERECTOMY  2008   partial, ovaries remain   CARPAL TUNNEL RELEASE Right 2015   CHOLECYSTECTOMY  05/19/2011   Procedure: LAPAROSCOPIC CHOLECYSTECTOMY;  Surgeon: Liz Malady, MD;  Location: [facility] OR;  Service: General;  Laterality: N/A;   LASER ABLATION CONDYLOMA CERVICAL / VULVAR     PERCUTANEOUS PINNING  01/22/2012   Procedure: PERCUTANEOUS PINNING EXTREMITY;   Surgeon: Eldred Manges, MD;  Location: [facility] OR;  Service: Orthopedics;  Laterality: Right;  Closed Reduction and Pinning right 5th Metacarpal Fracture   TUBAL LIGATION     WISDOM TOOTH EXTRACTION      Family History  Problem Relation Age of Onset   Diabetes Mother        Deceased   Hypertension Mother    Anemia Mother    Hyperlipidemia Mother    Arthritis/Rheumatoid Mother    Migraines Mother    CVA Mother    Hypertension Father        Deceased   Stroke Father    Diabetes Father        Legs Amputated   Heart disease Father    Arthritis Father    Cancer Father    Heart attack Maternal Aunt    Diabetes Maternal Uncle    Migraines Maternal Aunt    Asthma Maternal Aunt    Asthma Maternal Uncle    Stomach cancer Sister    Breast cancer Sister    Uterine cancer Sister        or Ovarian   Sleep apnea Brother    COPD Brother    Asthma Sister        x2   Sudden death Brother        SIDS  Hypertension Brother    Diabetes Sister    Asthma Daughter        x2   Allergies Daughter    Sickle cell trait Other        All Children   Atrial fibrillation Daughter    Anxiety disorder Daughter    Allergies Son    Hypertension Sister    Heart disease Sister    Asthma Brother     Social History   Tobacco Use   Smoking status: Never   Smokeless tobacco: Never  Vaping Use   Vaping status: Never Used  Substance Use Topics   Alcohol use: Yes    Comment: rarely    Drug use: No     Allergies  Allergen Reactions   Iodine Anaphylaxis   Shellfish Allergy Anaphylaxis   Latex Rash    Review of Systems NEGATIVE UNLESS OTHERWISE INDICATED IN HPI      Objective:     BP 118/70 (BP Location: Left Arm, Patient Position: Sitting, Cuff Size: Normal)   Pulse 73   Temp 97.8 F (36.6 C) (Temporal)   Ht 5\' 6"  (1.676 m)   Wt 210 lb 6.4 oz (95.4 kg)   SpO2 97%   BMI 33.96 kg/m   Wt Readings from Last 3 Encounters:  12/08/22 210 lb 6.4 oz (95.4 kg)  11/23/22 206 lb (93.4 kg)   11/08/22 206 lb 6.4 oz (93.6 kg)    BP Readings from Last 3 Encounters:  12/08/22 118/70  11/23/22 118/83  11/08/22 116/71     Physical Exam Vitals and nursing note reviewed.  Constitutional:      Appearance: Normal appearance.  Abdominal:     General: Abdomen is flat. Bowel sounds are normal.     Palpations: Abdomen is soft.     Tenderness: There is no right CVA tenderness or left CVA tenderness.  Genitourinary:    General: Normal vulva.     Vagina: Normal. No vaginal discharge, erythema or tenderness.     Uterus: Absent.      Rectum: Normal.     Comments: Bladder prolapse noted, pessary in vagina, not helping with prolapse  Neurological:     Mental Status: She is alert.        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 Repeat dose in 72 hours if still symptomatic.  Dispense: 6 tablet; Refill: 0   No acute symptoms provide like symptoms.  Bladder prolapse is noted on pelvic exam.  She does not currently have a vaginal yeast infection, stating that she just completed a course of Monistat and is feeling better.  I did give her Diflucan tablets to have in the future in case of recurrent yeast infections, which sounds like it is a problem for her.  I went ahead with a referral to gynecology to see [name], who said she can provide care in the interim while waiting for urogynecology, which we know is a long wait at this time.  I also placed a referral to urogynecology.  Continue regular care with the pessary for now.    Return in about 4 weeks (around 01/05/2023) for CPE, with fasting labs .   [name], PA-C

## 2022-12-10 ENCOUNTER — Other Ambulatory Visit: Payer: Self-pay | Admitting: Physician Assistant

## 2022-12-10 DIAGNOSIS — Z1231 Encounter for screening mammogram for malignant neoplasm of breast: Secondary | ICD-10-CM

## 2022-12-16 ENCOUNTER — Ambulatory Visit
Admission: RE | Admit: 2022-12-16 | Discharge: 2022-12-16 | Disposition: A | Discharge status: Home / Self Care | Payer: Federal, State, Local not specified - PPO | Source: Ambulatory Visit

## 2022-12-16 DIAGNOSIS — Z1231 Encounter for screening mammogram for malignant neoplasm of breast: Secondary | ICD-10-CM

## 2022-12-23 DIAGNOSIS — T63481A Toxic effect of venom of other arthropod, accidental (unintentional), initial encounter: Secondary | ICD-10-CM | POA: Diagnosis not present

## 2022-12-25 ENCOUNTER — Ambulatory Visit: Payer: Federal, State, Local not specified - PPO | Admitting: Physician Assistant

## 2023-01-04 ENCOUNTER — Other Ambulatory Visit (INDEPENDENT_AMBULATORY_CARE_PROVIDER_SITE_OTHER): Payer: Federal, State, Local not specified - PPO

## 2023-01-04 DIAGNOSIS — R7303 Prediabetes: Secondary | ICD-10-CM | POA: Diagnosis not present

## 2023-01-04 DIAGNOSIS — Z0000 Encounter for general adult medical examination without abnormal findings: Secondary | ICD-10-CM

## 2023-01-04 DIAGNOSIS — Z13220 Encounter for screening for lipoid disorders: Secondary | ICD-10-CM | POA: Diagnosis not present

## 2023-01-04 LAB — HEMOGLOBIN A1C: Hgb A1c MFr Bld: 6.1 % (ref 4.6–6.5)

## 2023-01-04 LAB — CBC WITH DIFFERENTIAL/PLATELET
Basophils Absolute: 0 10*3/uL (ref 0.0–0.1)
Basophils Relative: 0.2 % (ref 0.0–3.0)
Eosinophils Absolute: 0.1 10*3/uL (ref 0.0–0.7)
Eosinophils Relative: 1.3 % (ref 0.0–5.0)
HCT: 38.6 % (ref 36.0–46.0)
Hemoglobin: 12.4 g/dL (ref 12.0–15.0)
Lymphocytes Relative: 54.2 % — ABNORMAL HIGH (ref 12.0–46.0)
Lymphs Abs: 2.3 10*3/uL (ref 0.7–4.0)
MCHC: 32 g/dL (ref 30.0–36.0)
MCV: 80.5 fl (ref 78.0–100.0)
Monocytes Absolute: 0.3 10*3/uL (ref 0.1–1.0)
Monocytes Relative: 8.3 % (ref 3.0–12.0)
Neutro Abs: 1.5 10*3/uL (ref 1.4–7.7)
Neutrophils Relative %: 36 % — ABNORMAL LOW (ref 43.0–77.0)
Platelets: 216 10*3/uL (ref 150.0–400.0)
RBC: 4.8 Mil/uL (ref 3.87–5.11)
RDW: 14.6 % (ref 11.5–15.5)
WBC: 4.2 10*3/uL (ref 4.0–10.5)

## 2023-01-04 LAB — COMPREHENSIVE METABOLIC PANEL
ALT: 13 U/L (ref 0–35)
AST: 19 U/L (ref 0–37)
Albumin: 4.2 g/dL (ref 3.5–5.2)
Alkaline Phosphatase: 50 U/L (ref 39–117)
BUN: 9 mg/dL (ref 6–23)
CO2: 26 mEq/L (ref 19–32)
Calcium: 9.4 mg/dL (ref 8.4–10.5)
Chloride: 102 mEq/L (ref 96–112)
Creatinine, Ser: 0.7 mg/dL (ref 0.40–1.20)
GFR: 93.92 mL/min (ref >60.00)
Glucose, Bld: 107 mg/dL — ABNORMAL HIGH (ref 70–99)
Potassium: 2.9 mEq/L — ABNORMAL LOW (ref 3.5–5.1)
Sodium: 136 mEq/L (ref 135–145)
Total Bilirubin: 0.3 mg/dL (ref 0.2–1.2)
Total Protein: 7.6 g/dL (ref 6.0–8.3)

## 2023-01-04 LAB — LIPID PANEL
Cholesterol: 141 mg/dL (ref 0–200)
HDL: 58.1 mg/dL (ref >39.00)
LDL Cholesterol: 68 mg/dL (ref 0–99)
NonHDL: 83.14
Total CHOL/HDL Ratio: 2
Triglycerides: 76 mg/dL (ref 0.0–149.0)
VLDL: 15.2 mg/dL (ref 0.0–40.0)

## 2023-01-04 LAB — TSH: TSH: 1.02 u[IU]/mL (ref 0.35–5.50)

## 2023-01-06 ENCOUNTER — Other Ambulatory Visit: Payer: Self-pay

## 2023-01-06 DIAGNOSIS — E876 Hypokalemia: Secondary | ICD-10-CM

## 2023-01-08 ENCOUNTER — Encounter: Payer: Self-pay | Admitting: Obstetrics and Gynecology

## 2023-01-08 ENCOUNTER — Ambulatory Visit: Payer: Federal, State, Local not specified - PPO | Admitting: Physician Assistant

## 2023-01-08 ENCOUNTER — Ambulatory Visit: Payer: Federal, State, Local not specified - PPO | Admitting: Obstetrics and Gynecology

## 2023-01-08 VITALS — BP 131/84 | HR 64 | Ht 66.54 in | Wt 207.0 lb

## 2023-01-08 DIAGNOSIS — R350 Frequency of micturition: Secondary | ICD-10-CM | POA: Diagnosis not present

## 2023-01-08 DIAGNOSIS — N8110 Cystocele, unspecified: Secondary | ICD-10-CM

## 2023-01-08 LAB — POCT URINALYSIS DIPSTICK
Bilirubin, UA: NEGATIVE
Blood, UA: NEGATIVE
Glucose, UA: NEGATIVE — AB
Ketones, UA: NEGATIVE
Leukocytes, UA: NEGATIVE
Nitrite, UA: NEGATIVE
Protein, UA: NEGATIVE
Spec Grav, UA: >=1.03 — AB (ref 1.010–1.025)
Urobilinogen, UA: 0.2 U/dL
pH, UA: 7.5 (ref 5.0–8.0)

## 2023-01-08 NOTE — Progress Notes (Signed)
New Patient Evaluation and Consultation  Referring Provider: Allwardt, Crist Infante, PA-C PCP: Bary Leriche, PA-C Date of Service: 01/08/2023  SUBJECTIVE Chief Complaint: New Patient (Initial Visit) (Natalie Vazquez is a 62 y.o. female here fro a consult for prolapse.)  History of Present Illness: Natalie Vazquez is a 62 y.o. Black or African-American female seen in consultation at the request of PA Alyssa Allwardt for evaluation of prolapse.    Review of records significant for: Has a pessary for bladder prolpase but no longer holding.   Urinary Symptoms: Does not leak urine.   Day time voids "several" sometimes has urgency, but other times ok.  Nocturia: 1-2 times per night to void. Voiding dysfunction:  empties bladder well.  Patient does not use a catheter to empty bladder.  When urinating, patient feels the need to urinate multiple times in a row and to push on her belly or vagina to empty bladder   UTIs:  0  UTI's in the last year.   Denies history of blood in urine and kidney or bladder stones No results found for the last 90 days.   Pelvic Organ Prolapse Symptoms:                  Patient Admits to a feeling of a bulge the vaginal area. It has been present for 2 years.  Patient Admits to seeing a bulge.  This bulge is bothersome. Had a pessary but no longer working well for her.   Bowel Symptom: Bowel movements: daily or every other day Stool consistency: hard Straining: yes.  Splinting: yes.  Incomplete evacuation: yes.  Patient Denies accidental bowel leakage / fecal incontinence Bowel regimen: fiber and stool softener, SwissKriss laxative  HM Colonoscopy          Colonoscopy (Every 10 Years) Next due on 09/06/2031    09/05/2021  [facility] COLONOSCOPY   Only the first 1 history entries have been loaded, but more history exists.            Sexual Function Sexually active: no.  Sexual orientation: Straight Pain with sex: No  Pelvic Pain Denies pelvic  pain  Past Medical History:  Past Medical History:  Diagnosis Date   Allergic rhinitis due to other allergen    Allergy    seasonal   Anemia    Arthritis    Asthma    mild, intermittent   Chicken pox    Cholelithiasis    CTS (carpal tunnel syndrome)    Frequent urination    GERD (gastroesophageal reflux disease)    Headache(784.0)    Migraine    PONV (postoperative nausea and vomiting)    difficulty waking up   prediabetes    Problems related to high-risk sexual behavior    Unprotected sex   Symptomatic menopausal or female climacteric states      Past Surgical History:   Past Surgical History:  Procedure Laterality Date   CARPAL TUNNEL RELEASE Right 04/13/2013   CHOLECYSTECTOMY  05/19/2011   Procedure: LAPAROSCOPIC CHOLECYSTECTOMY;  Surgeon: Liz Malady, MD;  Location: [facility] OR;  Service: General;  Laterality: N/A;   LAPAROSCOPIC HYSTERECTOMY  04/13/2006   partial, ovaries remain   LASER ABLATION CONDYLOMA CERVICAL / VULVAR     PERCUTANEOUS PINNING  01/22/2012   Procedure: PERCUTANEOUS PINNING EXTREMITY;  Surgeon: Eldred Manges, MD;  Location: [facility] OR;  Service: Orthopedics;  Laterality: Right;  Closed Reduction and Pinning right 5th Metacarpal Fracture   TUBAL LIGATION  WISDOM TOOTH EXTRACTION       Past OB/GYN History: OB History  Gravida Para Term Preterm AB Living  5 4 4   1 5   SAB IAB Ectopic Multiple Live Births  1       5    # Outcome Date GA Lbr Len/2nd Weight Sex Type Anes PTL Lv  5 Term      Vag-Spont   LIV  4 Term      Vag-Spont   LIV  3 Term      Vag-Spont   LIV  2 Term      Vag-Spont   LIV  1 SAB             [id number] S/p hysterectomy HM PAP     This patient has no relevant Health Maintenance data.       Medications: Patient has a current medication list which includes the following prescription(s): barberry-oreg grape-goldenseal, bee pollen, biotin, cephalexin, cetirizine, vitamin d-3, contrave, cyclobenzaprine, diclofenac sodium,  doxycycline, epinephrine, fluconazole, ibuprofen, levalbuterol, NON FORMULARY, NON FORMULARY, NON FORMULARY, NON FORMULARY, olive leaf, olopatadine hcl, sumatriptan, turmeric, vitamin e, and zonisamide.   Allergies: Patient is allergic to iodine, shellfish allergy, and latex.   Social History:  Social History   Tobacco Use   Smoking status: Never   Smokeless tobacco: Never  Vaping Use   Vaping status: Never Used  Substance Use Topics   Alcohol use: Yes    Comment: rarely    Drug use: No    Relationship status: single Patient lives with daughters and granddaughters.   Patient is employed at [employer] Regular exercise: No History of abuse: No  Family History:   Family History  Problem Relation Age of Onset   Diabetes Mother        Deceased   Hypertension Mother    Anemia Mother    Hyperlipidemia Mother    Arthritis/Rheumatoid Mother    Migraines Mother    CVA Mother    Hypertension Father        Deceased   Stroke Father    Diabetes Father        Legs Amputated   Heart disease Father    Arthritis Father    Cancer Father    Heart attack Maternal Aunt    Diabetes Maternal Uncle    Migraines Maternal Aunt    Asthma Maternal Aunt    Asthma Maternal Uncle    Stomach cancer Sister    Breast cancer Sister    Uterine cancer Sister        or Ovarian   Sleep apnea Brother    COPD Brother    Asthma Sister        x2   Sudden death Brother        SIDS   Hypertension Brother    Diabetes Sister    Asthma Daughter        x2   Allergies Daughter    Sickle cell trait Other        All Children   Atrial fibrillation Daughter    Anxiety disorder Daughter    Allergies Son    Hypertension Sister    Heart disease Sister    Asthma Brother      Review of Systems: Review of Systems  Constitutional:  Negative for fever, malaise/fatigue and weight loss.  Respiratory:  Positive for cough. Negative for shortness of breath and wheezing.   Cardiovascular:  Negative for chest  pain, palpitations and leg swelling.  Gastrointestinal:  Negative for abdominal pain and blood in stool.  Genitourinary:  Negative for dysuria.  Musculoskeletal:  Positive for myalgias.  Skin:  Negative for rash.  Neurological:  Positive for dizziness and headaches.  Endo/Heme/Allergies:  Does not bruise/bleed easily.  Psychiatric/Behavioral:  Negative for depression. The patient is not nervous/anxious.      OBJECTIVE Physical Exam: Vitals:   01/08/23 1404  BP: 131/84  Pulse: 64  Weight: 207 lb (93.9 kg)  Height: 5' 6.54" (1.69 m)    Physical Exam Constitutional:      General: She is not in acute distress. Pulmonary:     Effort: Pulmonary effort is normal.  Abdominal:     General: There is no distension.     Palpations: Abdomen is soft.     Tenderness: There is no abdominal tenderness. There is no rebound.  Musculoskeletal:        General: No swelling. Normal range of motion.  Skin:    General: Skin is warm and dry.     Findings: No rash.  Neurological:     Mental Status: She is alert and oriented to person, place, and time.  Psychiatric:        Mood and Affect: Mood normal.        Behavior: Behavior normal.      GU / Detailed Urogynecologic Evaluation:  Pelvic Exam: Normal external female genitalia; Bartholin's and Skene's glands normal in appearance; urethral meatus normal in appearance, no urethral masses or discharge.   CST: negative  s/p hysterectomy: Speculum exam reveals normal vaginal mucosa with  atrophy and normal vaginal cuff.  Adnexa no mass, fullness, tenderness.    Pelvic floor strength I/V  Pelvic floor musculature: Right levator non-tender, Right obturator non-tender, Left levator non-tender, Left obturator non-tender  POP-Q:   POP-Q  1                                            Aa   1                                           Ba  -8                                              C   4                                            Gh  4.5                                             Pb  10                                            tvl   -2.5  Ap  -2.5                                            Bp                                                 D      Rectal Exam:  Normal external rectum  Post-Void Residual (PVR) by Bladder Scan: In order to evaluate bladder emptying, we discussed obtaining a postvoid residual and patient agreed to this procedure.  Procedure: The ultrasound unit was placed on the patient's abdomen in the suprapubic region after the patient had voided.      Laboratory Results: Lab Results  Component Value Date   COLORU Yellow 01/08/2023   CLARITYU Clear 01/08/2023   GLUCOSEUR Negative (A) 01/08/2023   BILIRUBINUR Negative 01/08/2023   KETONESU Negative 01/08/2023   SPECGRAV >=1.030 (A) 01/08/2023   RBCUR Negative 01/08/2023   PHUR 7.5 01/08/2023   PROTEINUR Negative 01/08/2023   UROBILINOGEN 0.2 01/08/2023   LEUKOCYTESUR Negative 01/08/2023    Lab Results  Component Value Date   CREATININE 0.70 01/04/2023   CREATININE 0.79 08/08/2021   CREATININE 0.82 01/01/2021    Lab Results  Component Value Date   HGBA1C 6.1 01/04/2023    Lab Results  Component Value Date   HGB 12.4 01/04/2023     ASSESSMENT AND PLAN Ms. Mahone is a 62 y.o. with:  1. Prolapse of anterior vaginal wall   2. Urinary frequency    Stage II anterior, Stage I posterior, Stage I apical prolapse - For treatment of pelvic organ prolapse, we discussed options for management including expectant management, conservative management, and surgical management, such as Kegels, a pessary, pelvic floor physical therapy, and specific surgical procedures. - She is interested in surgery. Recommend anterior repair with possible sacrospinous fixation, but appears to have good apical support. Handout provided for her to review.   2. Urinary frequency - overall not bothered - will  have her return for simple CMG to assess for occult incontinence prior to surgery  Marguerita Beards, MD

## 2023-01-08 NOTE — Patient Instructions (Signed)
You have a stage 2 (out of 4) prolapse.  We discussed the fact that it is not life threatening but there are several treatment options. For treatment of pelvic organ prolapse, we discussed options for management including expectant management, conservative management, and surgical management, such as Kegels, a pessary, pelvic floor physical therapy, and specific surgical procedures.

## 2023-01-19 ENCOUNTER — Other Ambulatory Visit (INDEPENDENT_AMBULATORY_CARE_PROVIDER_SITE_OTHER): Payer: Federal, State, Local not specified - PPO

## 2023-01-19 DIAGNOSIS — E876 Hypokalemia: Secondary | ICD-10-CM | POA: Diagnosis not present

## 2023-01-19 LAB — POTASSIUM: Potassium: 3.8 meq/L (ref 3.5–5.1)

## 2023-01-25 ENCOUNTER — Encounter: Payer: Self-pay | Admitting: Obstetrics and Gynecology

## 2023-01-25 ENCOUNTER — Ambulatory Visit: Payer: Federal, State, Local not specified - PPO | Admitting: Obstetrics and Gynecology

## 2023-01-25 VITALS — BP 132/81 | HR 76

## 2023-01-25 DIAGNOSIS — N993 Prolapse of vaginal vault after hysterectomy: Secondary | ICD-10-CM

## 2023-01-25 NOTE — Patient Instructions (Addendum)
Recommend starting fiber supplement- benefiber, metamucil- OR stool softener (colace) in addition to the laxative to help with straining.   Plan for surgery: Anterior repair, possible sacrospinous fixation, cystoscopy

## 2023-01-25 NOTE — Progress Notes (Addendum)
[facility] Return Visit  SUBJECTIVE  History of Present Illness: Natalie Vazquez is a 62 y.o. female seen in follow-up for prolapse. She is interested in proceeding with surgery.   Will plan for a simple CMG today. She denies leakage with cough/ sneeze.    Past Medical History: Patient  has a past medical history of Allergic rhinitis due to other allergen, Allergy, Anemia, Arthritis, Asthma, Chicken pox, Cholelithiasis, CTS (carpal tunnel syndrome), Frequent urination, GERD (gastroesophageal reflux disease), Headache(784.0), Migraine, PONV (postoperative nausea and vomiting), prediabetes, Problems related to high-risk sexual behavior, and Symptomatic menopausal or female climacteric states.   Past Surgical History: She  has a past surgical history that includes Tubal ligation; Laparoscopic hysterectomy (04/13/2006); Laser ablation condyloma cervical / vulvar; Cholecystectomy (05/19/2011); Percutaneous pinning (01/22/2012); Wisdom tooth extraction; and Carpal tunnel release (Right, 04/13/2013).   Medications: She has a current medication list which includes the following prescription(s): barberry-oreg grape-goldenseal, bee pollen, biotin, cephalexin, cetirizine, vitamin d-3, contrave, cyclobenzaprine, diclofenac sodium, doxycycline, epinephrine, fluconazole, ibuprofen, levalbuterol, NON FORMULARY, NON FORMULARY, NON FORMULARY, NON FORMULARY, olive leaf, olopatadine hcl, sumatriptan, turmeric, vitamin e, and zonisamide.   Allergies: Patient is allergic to iodine, shellfish allergy, and latex.   Social History: Patient  reports that she has never smoked. She has never used smokeless tobacco. She reports current alcohol use. She reports that she does not use drugs.      OBJECTIVE     Physical Exam: Vitals:   01/25/23 0935  BP: 132/81  Pulse: 76   Gen: No apparent distress, A&O x 3.  Detailed Urogynecologic Evaluation:  Deferred. Prior exam showed:  POP-Q (01/08/23)    1                                            Aa   1                                           Ba   -8                                              C    4                                            Gh   4.5                                            Pb   10                                            tvl    -2.5  Ap   -2.5                                            Bp                                                  D      Verbal consent was obtained to perform simple CMG procedure:   Prolapse was reduced using 2 large cotton swabs. Urethra was prepped with betadine and a 77F catheter was placed and bladder was drained completely. The bladder was then backfilled with sterile water by gravity.  First sensation: First Desire: Strong Desire: Capacity: Cough stress test was negative. Valsalva stress test was negative.  This was performed in both sitting and standing positions. Catheter was replaced to empty bladder.   Interpretation: CMG showed normal sensation, and normal cystometric capacity. Findings negative for stress incontinence, negative for detrusor overactivity.       ASSESSMENT AND PLAN    Natalie Vazquez is a 62 y.o. 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 The planned surgical procedures were discussed along with the surgical risks outlined below, which were also provided on a detailed handout. Additional treatment options including expectant management, conservative management, medical management were discussed where appropriate.  We reviewed the benefits and risks of each treatment option.   General Surgical Risks: For all procedures,  there are risks of bleeding, infection, damage to surrounding organs including but not limited to bowel, bladder, blood vessels, ureters and nerves, and need for further surgery if an injury were to occur. These risks are all low with minimally invasive surgery.   There are risks of numbness and weakness at any body site or buttock/rectal pain.  It is possible that baseline pain can be worsened by surgery, either with or without mesh. If surgery is vaginal, there is also a low risk of possible conversion to laparoscopy or open abdominal incision where indicated. Very rare risks include blood transfusion, blood clot, heart attack, pneumonia, or death.   There is also a risk of short-term postoperative urinary retention with need to use a catheter. About half of patients need to go home from surgery with a catheter, which is then later removed in the office. The risk of long-term need for a catheter is very low. There is also a risk of worsening of overactive bladder.   Prolapse (with or without mesh): Risk factors for surgical failure  include things that put pressure on your pelvis and the surgical repair, including obesity, chronic cough, and heavy lifting or straining (including lifting children or adults, straining on the toilet, or lifting heavy objects such as furniture or anything weighing >25 lbs. Risks of recurrence is 20-30% with vaginal native tissue repair and a less than 10% with sacrocolpopexy with mesh.    - For preop Visit:  She is required to have a visit within 30 days of her surgery.   - Medical clearance: not required  - Anticoagulant use:  No - Medicaid Hysterectomy form: No - Accepts blood transfusion: Yes - Expected length of stay: outpatient  Request sent for surgery scheduling.   Marguerita Beards, MD

## 2023-02-06 ENCOUNTER — Encounter: Payer: Self-pay | Admitting: Obstetrics and Gynecology

## 2023-02-18 ENCOUNTER — Other Ambulatory Visit: Payer: Self-pay | Admitting: Obstetrics and Gynecology

## 2023-02-18 DIAGNOSIS — N993 Prolapse of vaginal vault after hysterectomy: Secondary | ICD-10-CM

## 2023-03-19 ENCOUNTER — Encounter: Payer: Federal, State, Local not specified - PPO | Admitting: Physician Assistant

## 2023-04-20 ENCOUNTER — Telehealth (HOSPITAL_BASED_OUTPATIENT_CLINIC_OR_DEPARTMENT_OTHER): Payer: Self-pay | Admitting: Obstetrics & Gynecology

## 2023-04-20 NOTE — Telephone Encounter (Signed)
 Pt left voicemail on nurse line at [facility] requesting Dr. Florian Buff call her back.  Secure chat message sent to Urogyn office to communicate with pt.  Pt did not ask questions about surgical scheduling.

## 2023-04-20 NOTE — Telephone Encounter (Signed)
 Attemped to return patient call

## 2023-04-21 NOTE — Telephone Encounter (Signed)
 Called pt but she was on her way to work and could not talk. Will try again tomorrow.

## 2023-04-23 NOTE — Telephone Encounter (Signed)
 Attempted to call 2x. Call did not go through.

## 2023-04-23 NOTE — Telephone Encounter (Signed)
 Called patient and discussed surgery options. She was concerned about lifting after surgery. We reviewed that based on her exam, she is unlikely to need an apical suspension. Therefore, would recommend vaginal procedure (anterior repair, sacrospinous fixation). We discussed success rates of this procedure vs procedure robotically with mesh. After any procedure, cannot guarantee 100% success. Also discussed lifting after surgery and long term- needs to be careful with lifting heavy objects.   Will proceed with surgery as planned.

## 2023-04-27 IMAGING — MG MM DIGITAL SCREENING BILAT W/ TOMO AND CAD
8 series · 8 of 24 positions shown · non-contrast
Comparison: Previous exam(s).

CLINICAL DATA: Screening.

EXAM:
DIGITAL SCREENING BILATERAL MAMMOGRAM WITH TOMOSYNTHESIS AND CAD
TECHNIQUE: Bilateral screening digital craniocaudal and mediolateral oblique
mammograms were obtained. Bilateral screening digital breast
tomosynthesis was performed. The images were evaluated with
computer-aided detection.

[L MLO synth-2D]
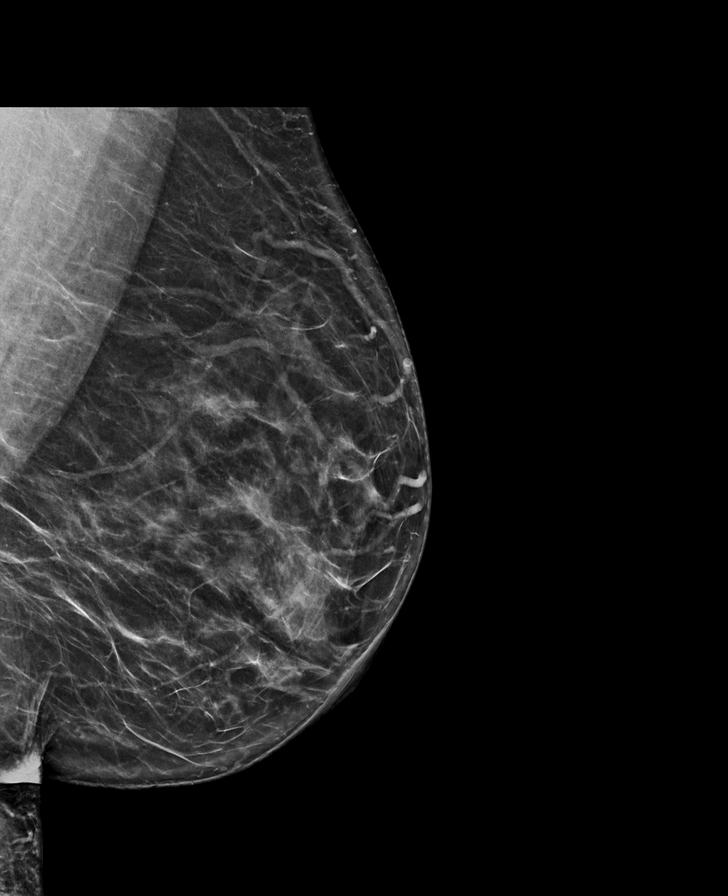

[R CC synth-2D]
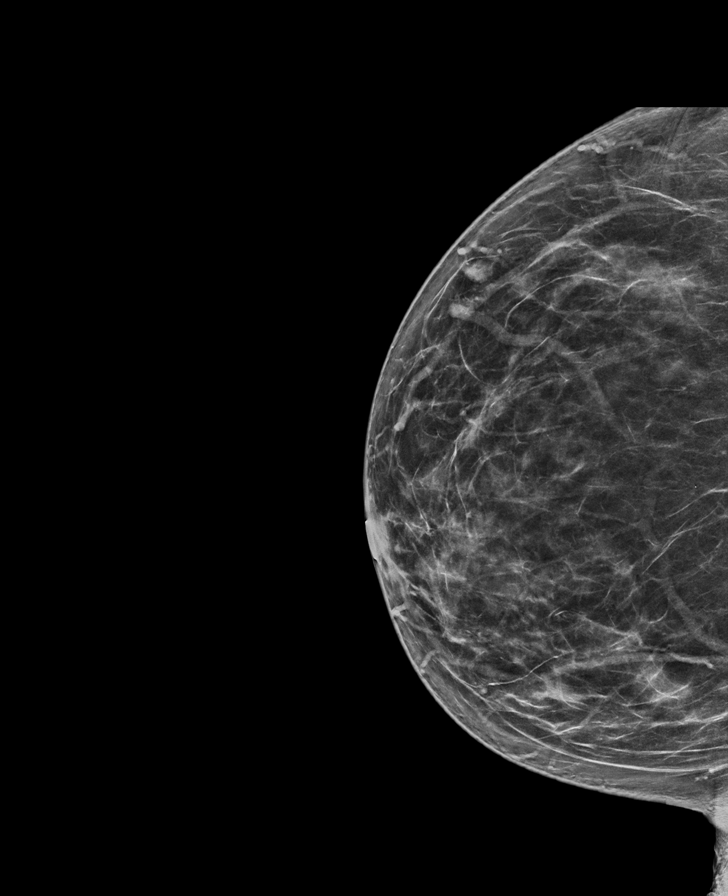

[R MLO synth-2D]
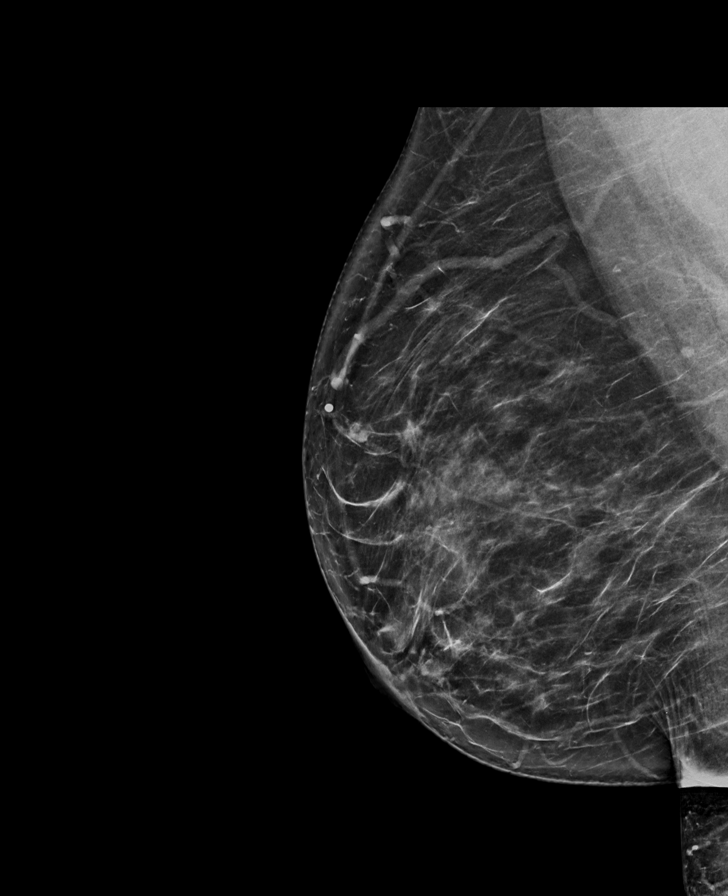

[L CC synth-2D]
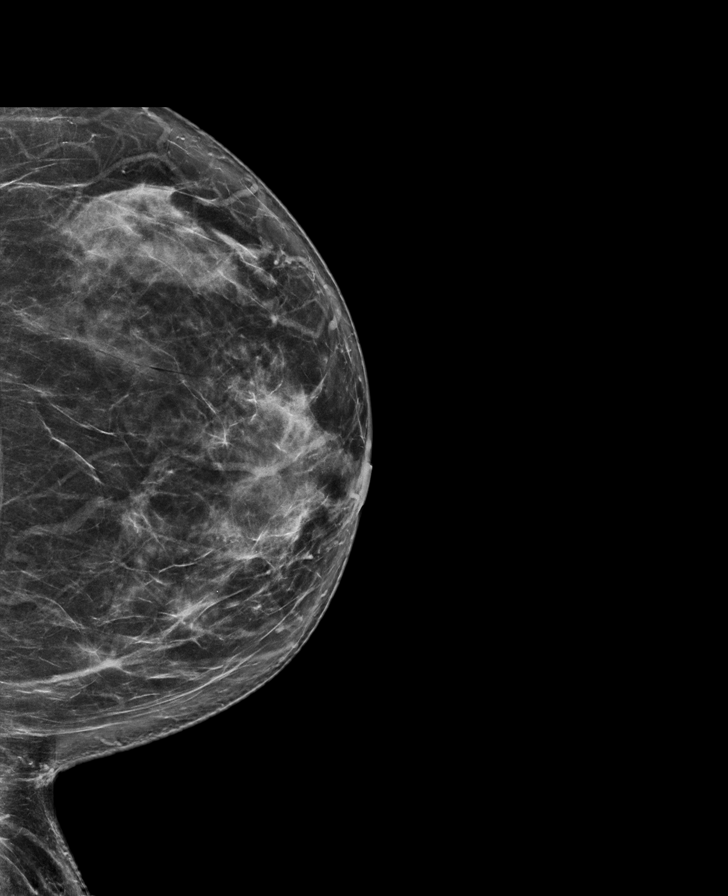

[R CC tomo · tomo slice 41/81.0]
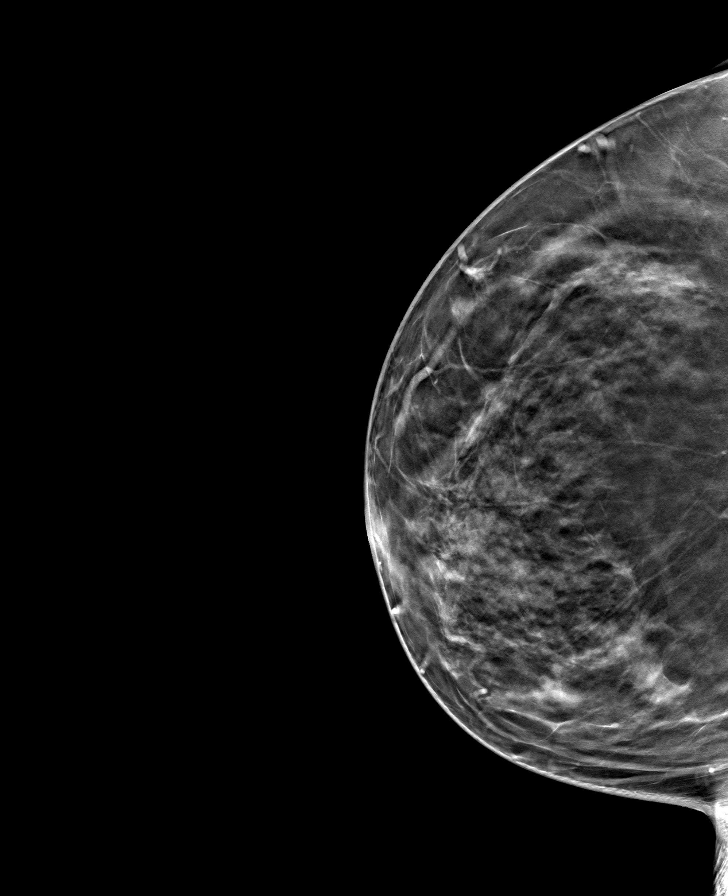

[R MLO tomo · tomo slice 45/88.0]
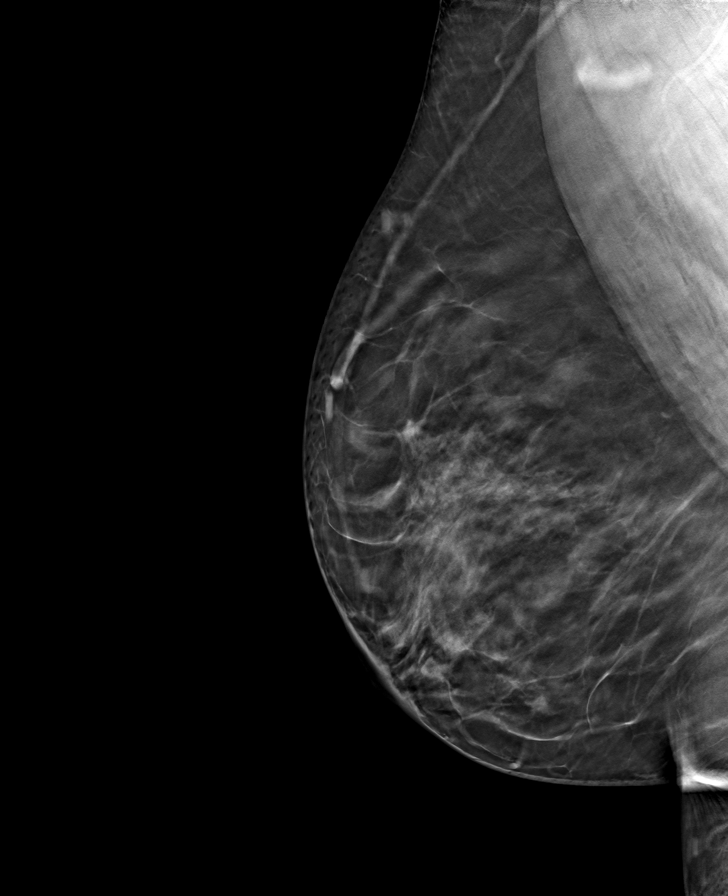

[L CC tomo · tomo slice 43/85.0]
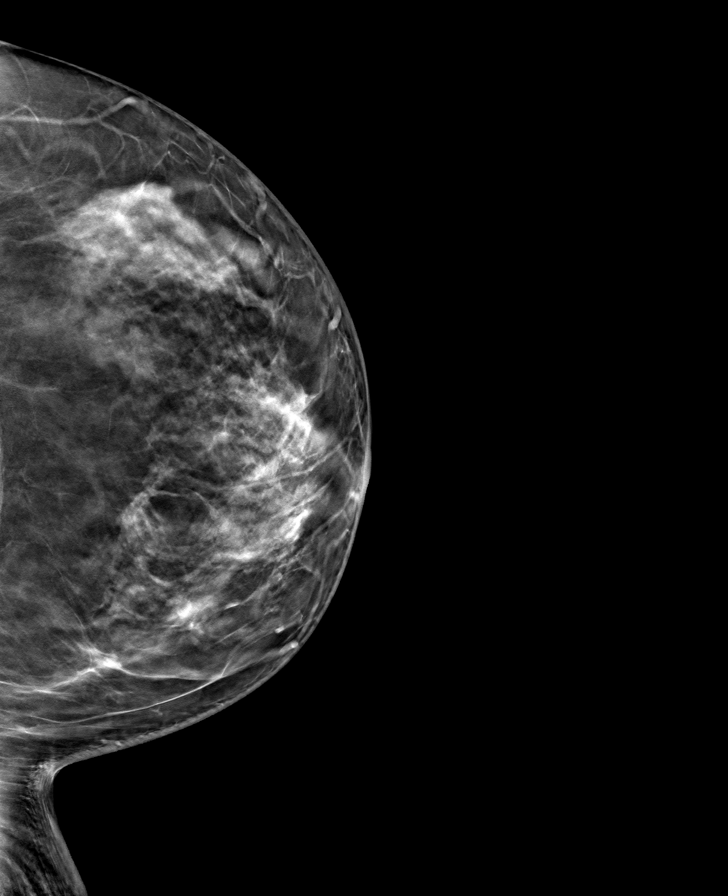

[L MLO tomo · tomo slice 42/83.0]
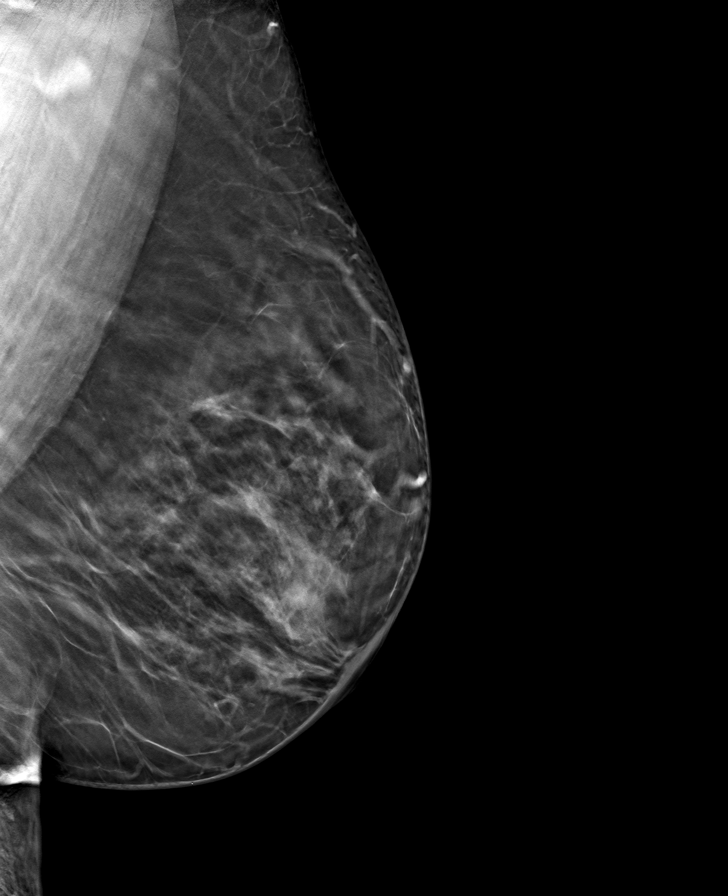

[8 of 24 positions shown; findings below may reference images not displayed]

ACR Breast Density Category c: The breast tissue is heterogeneously
dense, which may obscure small masses.
FINDINGS: There are no findings suspicious for malignancy.
IMPRESSION: No mammographic evidence of malignancy. A result letter of this
screening mammogram will be mailed directly to the patient.

RECOMMENDATION:
Screening mammogram in one year. (Code:Q3-W-BC3)

BI-RADS CATEGORY  1: Negative.

## 2023-04-27 NOTE — Telephone Encounter (Signed)
Pt has been contacted and scheduled

## 2023-05-14 DIAGNOSIS — G43019 Migraine without aura, intractable, without status migrainosus: Secondary | ICD-10-CM | POA: Diagnosis not present

## 2023-05-17 ENCOUNTER — Encounter: Payer: Federal, State, Local not specified - PPO | Admitting: Physician Assistant

## 2023-05-17 ENCOUNTER — Encounter: Payer: Self-pay | Admitting: Physician Assistant

## 2023-05-17 ENCOUNTER — Ambulatory Visit (INDEPENDENT_AMBULATORY_CARE_PROVIDER_SITE_OTHER): Payer: Federal, State, Local not specified - PPO | Admitting: Physician Assistant

## 2023-05-17 VITALS — BP 134/74 | HR 72 | Temp 97.2°F | Ht 66.0 in | Wt 213.2 lb

## 2023-05-17 DIAGNOSIS — Z131 Encounter for screening for diabetes mellitus: Secondary | ICD-10-CM | POA: Diagnosis not present

## 2023-05-17 DIAGNOSIS — Z91013 Allergy to seafood: Secondary | ICD-10-CM | POA: Diagnosis not present

## 2023-05-17 DIAGNOSIS — Z0000 Encounter for general adult medical examination without abnormal findings: Secondary | ICD-10-CM

## 2023-05-17 DIAGNOSIS — Z13220 Encounter for screening for lipoid disorders: Secondary | ICD-10-CM | POA: Diagnosis not present

## 2023-05-17 LAB — COMPREHENSIVE METABOLIC PANEL
ALT: 13 U/L (ref 0–35)
AST: 16 U/L (ref 0–37)
Albumin: 4 g/dL (ref 3.5–5.2)
Alkaline Phosphatase: 47 U/L (ref 39–117)
BUN: 9 mg/dL (ref 6–23)
CO2: 24 meq/L (ref 19–32)
Calcium: 8.9 mg/dL (ref 8.4–10.5)
Chloride: 105 meq/L (ref 96–112)
Creatinine, Ser: 0.7 mg/dL (ref 0.40–1.20)
GFR: 93.67 mL/min (ref >60.00)
Glucose, Bld: 95 mg/dL (ref 70–99)
Potassium: 3.4 meq/L — ABNORMAL LOW (ref 3.5–5.1)
Sodium: 137 meq/L (ref 135–145)
Total Bilirubin: 0.3 mg/dL (ref 0.2–1.2)
Total Protein: 7.3 g/dL (ref 6.0–8.3)

## 2023-05-17 LAB — HEMOGLOBIN A1C: Hgb A1c MFr Bld: 6.3 % (ref 4.6–6.5)

## 2023-05-17 LAB — LIPID PANEL
Cholesterol: 139 mg/dL (ref 0–200)
HDL: 61.5 mg/dL (ref >39.00)
LDL Cholesterol: 66 mg/dL (ref 0–99)
NonHDL: 77.03
Total CHOL/HDL Ratio: 2
Triglycerides: 56 mg/dL (ref 0.0–149.0)
VLDL: 11.2 mg/dL (ref 0.0–40.0)

## 2023-05-17 LAB — CBC WITH DIFFERENTIAL/PLATELET
Basophils Absolute: 0 10*3/uL (ref 0.0–0.1)
Basophils Relative: 0.5 % (ref 0.0–3.0)
Eosinophils Absolute: 0.1 10*3/uL (ref 0.0–0.7)
Eosinophils Relative: 2 % (ref 0.0–5.0)
HCT: 38.4 % (ref 36.0–46.0)
Hemoglobin: 12.4 g/dL (ref 12.0–15.0)
Lymphocytes Relative: 49.4 % — ABNORMAL HIGH (ref 12.0–46.0)
Lymphs Abs: 2.6 10*3/uL (ref 0.7–4.0)
MCHC: 32.3 g/dL (ref 30.0–36.0)
MCV: 80.8 fL (ref 78.0–100.0)
Monocytes Absolute: 0.4 10*3/uL (ref 0.1–1.0)
Monocytes Relative: 7 % (ref 3.0–12.0)
Neutro Abs: 2.2 10*3/uL (ref 1.4–7.7)
Neutrophils Relative %: 41.1 % — ABNORMAL LOW (ref 43.0–77.0)
Platelets: 212 10*3/uL (ref 150.0–400.0)
RBC: 4.74 Mil/uL (ref 3.87–5.11)
RDW: 14.7 % (ref 11.5–15.5)
WBC: 5.3 10*3/uL (ref 4.0–10.5)

## 2023-05-17 LAB — TSH: TSH: 1.8 u[IU]/mL (ref 0.35–5.50)

## 2023-05-17 MED ORDER — EPINEPHRINE 0.3 MG/0.3ML IJ SOAJ: 0.3000 mg | INTRAMUSCULAR | 1 refills | Status: AC | PRN

## 2023-05-17 MED ORDER — LEVALBUTEROL TARTRATE 45 MCG/ACT IN AERO: INHALATION_SPRAY | RESPIRATORY_TRACT | 5 refills | Status: AC

## 2023-05-17 NOTE — Progress Notes (Signed)
Patient ID: Natalie Vazquez, female    DOB: 05-18-1960, 63 y.o.   MRN: [id number]   Assessment & Plan:  Annual physical exam -     CBC with Differential/Platelet -     Comprehensive metabolic panel -     Hemoglobin A1c -     Lipid panel -     TSH  Shellfish allergy -     EPINEPHrine; Inject 0.3 mg into the muscle as needed for anaphylaxis.  Dispense: 2 each; Refill: 1  Other orders -     Levalbuterol Tartrate; INHALE 1 TO 2 PUFFS BY MOUTH EVERY 6 HOURS AS NEEDED FOR WHEEZING  Dispense: 15 g; Refill: 5    Age-appropriate screening and counseling performed today. Will check labs and call with results. Preventive measures discussed and printed in AVS for patient.   Patient Counseling: [x]   Nutrition: Stressed importance of moderation in sodium/caffeine intake, saturated fat and cholesterol, caloric balance, sufficient intake of fresh fruits, vegetables, and fiber.  [x]   Stressed the importance of regular exercise.   []   Substance Abuse: Discussed cessation/primary prevention of tobacco, alcohol, or other drug use; driving or other dangerous activities under the influence; availability of treatment for abuse.   [x]   Injury prevention: Discussed safety belts, safety helmets, smoke detector, smoking near bedding or upholstery.   []   Sexuality: Discussed sexually transmitted diseases, partner selection, use of condoms, avoidance of unintended pregnancy  and contraceptive alternatives.   [x]   Dental health: Discussed importance of regular tooth brushing, flossing, and dental visits.  [x]   Health maintenance and immunizations reviewed. Please refer to Health maintenance section.        Return in about 1 year (around 05/16/2024) for physical.    Subjective:    Chief Complaint  Patient presents with   Annual Exam    Pt in office for annual CPE and fasting labs;     HPI Patient is in today for annual exam. Family members at home are sick with the flu, but she's feeling well  currently. Walks regularly with her dog, on her feet a lot at work. Would like to get under 200 lb she says, but does well for awhile and then gains back. Contrave helped for some time.  She is UTD with health maintenance items. No other concerns today.   Past Medical History:  Diagnosis Date   Allergic rhinitis due to other allergen    Allergy    seasonal   Anemia    Arthritis    Asthma    mild, intermittent   Chicken pox    Cholelithiasis    CTS (carpal tunnel syndrome)    Frequent urination    GERD (gastroesophageal reflux disease)    Headache(784.0)    Migraine    PONV (postoperative nausea and vomiting)    difficulty waking up   prediabetes    Problems related to high-risk sexual behavior    Unprotected sex   Symptomatic menopausal or female climacteric states     Past Surgical History:  Procedure Laterality Date   CARPAL TUNNEL RELEASE Right 04/13/2013   CHOLECYSTECTOMY  05/19/2011   Procedure: LAPAROSCOPIC CHOLECYSTECTOMY;  Surgeon: Liz Malady, MD;  Location: [facility] OR;  Service: General;  Laterality: N/A;   LAPAROSCOPIC HYSTERECTOMY  04/13/2006   partial, ovaries remain   LASER ABLATION CONDYLOMA CERVICAL / VULVAR     PERCUTANEOUS PINNING  01/22/2012   Procedure: PERCUTANEOUS PINNING EXTREMITY;  Surgeon: Eldred Manges, MD;  Location: [facility];  Service: Orthopedics;  Laterality: Right;  Closed Reduction and Pinning right 5th Metacarpal Fracture   TUBAL LIGATION     WISDOM TOOTH EXTRACTION      Family History  Problem Relation Age of Onset   Diabetes Mother        Deceased   Hypertension Mother    Anemia Mother    Hyperlipidemia Mother    Arthritis/Rheumatoid Mother    Migraines Mother    CVA Mother    Hypertension Father        Deceased   Stroke Father    Diabetes Father        Legs Amputated   Heart disease Father    Arthritis Father    Cancer Father    Heart attack Maternal Aunt    Diabetes Maternal Uncle    Migraines Maternal Aunt    Asthma  Maternal Aunt    Asthma Maternal Uncle    Stomach cancer Sister    Breast cancer Sister    Uterine cancer Sister        or Ovarian   Sleep apnea Brother    COPD Brother    Asthma Sister        x2   Sudden death Brother        SIDS   Hypertension Brother    Diabetes Sister    Asthma Daughter        x2   Allergies Daughter    Sickle cell trait Other        All Children   Atrial fibrillation Daughter    Anxiety disorder Daughter    Allergies Son    Hypertension Sister    Heart disease Sister    Asthma Brother     Social History   Tobacco Use   Smoking status: Never   Smokeless tobacco: Never  Vaping Use   Vaping status: Never Used  Substance Use Topics   Alcohol use: Yes    Comment: rarely    Drug use: No     Allergies  Allergen Reactions   Iodine Anaphylaxis   Shellfish Allergy Anaphylaxis   Latex Rash    Review of Systems NEGATIVE UNLESS OTHERWISE INDICATED IN HPI      Objective:     BP 134/74 (BP Location: Left Arm, Patient Position: Sitting, Cuff Size: Normal)   Pulse 72   Temp (!) 97.2 F (36.2 C) (Temporal)   Ht 5\' 6"  (1.676 m)   Wt 213 lb 3.2 oz (96.7 kg)   SpO2 97%   BMI 34.41 kg/m   Wt Readings from Last 3 Encounters:  05/17/23 213 lb 3.2 oz (96.7 kg)  01/08/23 207 lb (93.9 kg)  12/08/22 210 lb 6.4 oz (95.4 kg)    BP Readings from Last 3 Encounters:  05/17/23 134/74  01/25/23 132/81  01/08/23 131/84     Physical Exam Vitals and nursing note reviewed.  Constitutional:      Appearance: Normal appearance. She is obese. She is not toxic-appearing.  HENT:     Head: Normocephalic and atraumatic.     Right Ear: Tympanic membrane, ear canal and external ear normal.     Left Ear: Tympanic membrane, ear canal and external ear normal.     Nose: Nose normal.     Mouth/Throat:     Mouth: Mucous membranes are moist.  Eyes:     Extraocular Movements: Extraocular movements intact.     Conjunctiva/sclera: Conjunctivae normal.     Pupils:  Pupils are equal, round, and  reactive to light.  Cardiovascular:     Rate and Rhythm: Normal rate and regular rhythm.     Pulses: Normal pulses.     Heart sounds: Normal heart sounds.  Pulmonary:     Effort: Pulmonary effort is normal.     Breath sounds: Normal breath sounds.  Abdominal:     General: Abdomen is flat. Bowel sounds are normal.     Palpations: Abdomen is soft.  Musculoskeletal:        General: Normal range of motion.     Cervical back: Normal range of motion and neck supple.  Skin:    General: Skin is warm and dry.  Neurological:     General: No focal deficit present.     Mental Status: She is alert and oriented to person, place, and time.  Psychiatric:        Mood and Affect: Mood normal.        Behavior: Behavior normal.        Thought Content: Thought content normal.        Judgment: Judgment normal.      Leylany Nored M Morena Mckissack, PA-C

## 2023-05-17 NOTE — Patient Instructions (Signed)
Keep up good work, Building control surveyor!

## 2023-05-21 ENCOUNTER — Encounter: Payer: Self-pay | Admitting: Obstetrics and Gynecology

## 2023-05-21 ENCOUNTER — Ambulatory Visit (INDEPENDENT_AMBULATORY_CARE_PROVIDER_SITE_OTHER): Payer: Federal, State, Local not specified - PPO | Admitting: Obstetrics and Gynecology

## 2023-05-21 VITALS — BP 117/76 | HR 71 | Ht 65.75 in | Wt 212.2 lb

## 2023-05-21 DIAGNOSIS — N993 Prolapse of vaginal vault after hysterectomy: Secondary | ICD-10-CM

## 2023-05-21 DIAGNOSIS — Z01818 Encounter for other preprocedural examination: Secondary | ICD-10-CM

## 2023-05-21 DIAGNOSIS — N8110 Cystocele, unspecified: Secondary | ICD-10-CM

## 2023-05-21 MED ORDER — IBUPROFEN 600 MG PO TABS: 600.0000 mg | ORAL_TABLET | Freq: Four times a day (QID) | ORAL | 0 refills | Status: AC | PRN

## 2023-05-21 MED ORDER — OXYCODONE HCL 5 MG PO TABS
5.0000 mg | ORAL_TABLET | ORAL | 0 refills | Status: DC | PRN
Start: 2023-05-21 — End: 2023-11-24

## 2023-05-21 MED ORDER — ONDANSETRON HCL 4 MG PO TABS: 4.0000 mg | ORAL_TABLET | Freq: Three times a day (TID) | ORAL | 0 refills | Status: DC | PRN

## 2023-05-21 MED ORDER — ACETAMINOPHEN 500 MG PO TABS: 500.0000 mg | ORAL_TABLET | Freq: Four times a day (QID) | ORAL | 0 refills | Status: AC | PRN

## 2023-05-21 NOTE — Progress Notes (Signed)
  Urogynecology Pre-Operative H&P  Subjective Chief Complaint: Natalie Vazquez presents for a preoperative encounter.   History of Present Illness: Natalie Vazquez is a 63 y.o. female who presents for preoperative visit.  She is scheduled to undergo Exam under anesthesia, anterior repair, possible sacrospinous fixation, cystoscopy  on 06/07/23.  Her symptoms include pelvic organ prolapse, and she was was found to have Stage II anterior, Stage I posterior, Stage I apical prolapse.   Simple CMG showed: CMG showed normal sensation, and normal cystometric capacity. Findings negative for stress incontinence, negative for detrusor overactivity.   Past Medical History:  Diagnosis Date   Allergic rhinitis due to other allergen    Allergy    seasonal   Anemia    Arthritis    Asthma    mild, intermittent   Chicken pox    Cholelithiasis    CTS (carpal tunnel syndrome)    Frequent urination    GERD (gastroesophageal reflux disease)    Headache(784.0)    Migraine    PONV (postoperative nausea and vomiting)    difficulty waking up   prediabetes    Problems related to high-risk sexual behavior    Unprotected sex   Symptomatic menopausal or female climacteric states      Past Surgical History:  Procedure Laterality Date   CARPAL TUNNEL RELEASE Right 04/13/2013   CHOLECYSTECTOMY  05/19/2011   Procedure: LAPAROSCOPIC CHOLECYSTECTOMY;  Surgeon: Dann FORBES Hummer, MD;  Location: [facility];  Service: General;  Laterality: N/A;   LAPAROSCOPIC HYSTERECTOMY  04/13/2006   partial, ovaries remain   LASER ABLATION CONDYLOMA CERVICAL / VULVAR     PERCUTANEOUS PINNING  01/22/2012   Procedure: PERCUTANEOUS PINNING EXTREMITY;  Surgeon: Oneil JAYSON Herald, MD;  Location: [facility];  Service: Orthopedics;  Laterality: Right;  Closed Reduction and Pinning right 5th Metacarpal Fracture   TUBAL LIGATION     WISDOM TOOTH EXTRACTION      is allergic to iodine, shellfish allergy, and latex.   Family History   Problem Relation Age of Onset   Diabetes Mother        Deceased   Hypertension Mother    Anemia Mother    Hyperlipidemia Mother    Arthritis/Rheumatoid Mother    Migraines Mother    CVA Mother    Hypertension Father        Deceased   Stroke Father    Diabetes Father        Legs Amputated   Heart disease Father    Arthritis Father    Cancer Father    Stomach cancer Sister    Breast cancer Sister    Uterine cancer Sister        or Ovarian   Asthma Sister        x2   Diabetes Sister    Hypertension Sister    Heart disease Sister    Sleep apnea Brother    COPD Brother    Sudden death Brother        SIDS   Hypertension Brother    Asthma Brother    Asthma Daughter        x2   Allergies Daughter    Atrial fibrillation Daughter    Anxiety disorder Daughter    Allergies Son    Heart attack Maternal Aunt    Migraines Maternal Aunt    Asthma Maternal Aunt    Diabetes Maternal Uncle    Asthma Maternal Uncle    Sickle cell trait Other  All Children   Bladder Cancer Neg Hx     Social History   Tobacco Use   Smoking status: Never   Smokeless tobacco: Never  Vaping Use   Vaping status: Never Used  Substance Use Topics   Alcohol use: Yes    Comment: rarely    Drug use: No     Review of Systems was negative for a full 10 system review except as noted in the History of Present Illness.   Current Outpatient Medications:    Barberry-Oreg Grape-Goldenseal (BERBERINE COMPLEX PO), Take by mouth., Disp: , Rfl:    Bee Pollen 1000 MG TABS, Take 1 tablet by mouth., Disp: , Rfl:    Biotin 1000 MCG tablet, Take 1,000 mcg by mouth daily. Three times a week, Disp: , Rfl:    cetirizine  (ZYRTEC  ALLERGY) 10 MG tablet, Take 1 tablet (10 mg total) by mouth daily., Disp: 30 tablet, Rfl: 11   Cholecalciferol (VITAMIN D -3) 25 MCG (1000 UT) CAPS, Take 1 capsule (1,000 Units total) by mouth daily., Disp: 30 capsule, Rfl: 5   cyclobenzaprine  (FLEXERIL ) 10 MG tablet, 1 tablet PO  QHS PRN, Disp: 30 tablet, Rfl: 0   EPINEPHrine  0.3 mg/0.3 mL IJ SOAJ injection, Inject 0.3 mg into the muscle as needed for anaphylaxis., Disp: 2 each, Rfl: 1   ibuprofen  (ADVIL ) 800 MG tablet, Take 1 tablet (800 mg total) by mouth every 8 (eight) hours as needed., Disp: 45 tablet, Rfl: 0   levalbuterol  (XOPENEX  HFA) 45 MCG/ACT inhaler, INHALE 1 TO 2 PUFFS BY MOUTH EVERY 6 HOURS AS NEEDED FOR WHEEZING, Disp: 15 g, Rfl: 5   lubiprostone  (AMITIZA ) 8 MCG capsule, Take 8 mcg by mouth daily with breakfast., Disp: , Rfl:    NON FORMULARY, Take 3 tablets by mouth as needed. Swiss Kriss Herbal Laxative Tab, Disp: , Rfl:    NON FORMULARY, Golden Thread Suprene 60-50 one time a day, Disp: , Rfl:    NON FORMULARY, Melia supreme 445 mg  one time a day, Disp: , Rfl:    NON FORMULARY, Neuro - mag L-T one a day, Disp: , Rfl:    OLIVE LEAF PO, Take by mouth., Disp: , Rfl:    Olopatadine  HCl 0.2 % SOLN, Apply 1 drop to eye daily as needed (itchy/watery eyes)., Disp: 2.5 mL, Rfl: 11   SUMAtriptan  (IMITREX ) 100 MG tablet, Take one tablet by mouth onset of headache, May repeat in 2 hours if headache persists or recurs., Disp: 10 tablet, Rfl: 0   Turmeric 500 MG CAPS, Take 1 tablet by mouth daily., Disp: , Rfl:    vitamin E 180 MG (400 UNITS) capsule, Take 1 capsule by mouth daily., Disp: , Rfl:    zonisamide  (ZONEGRAN ) 25 MG capsule, Take 1 capsule by mouth daily., Disp: , Rfl: 5   Objective Vitals:   05/21/23 0850  BP: 117/76  Pulse: 71    Gen: NAD CV: S1 S2 RRR Lungs: Clear to auscultation bilaterally Abd: soft, nontender   Previous Pelvic Exam showed: POP-Q (01/08/23)   1                                            Aa   1  Ba   -8                                              C    4                                            Gh   4.5                                            Pb   10                                            tvl    -2.5                                             Ap   -2.5                                            Bp                                                  D         Assessment/ Plan  Assessment: The patient is a 63 y.o. year old scheduled to undergo Exam under anesthesia, anterior repair, possible sacrospinous fixation, cystoscopy . Verbal consent was obtained for these procedures.  Plan: General Surgical Consent: The patient has previously been counseled on alternative treatments, and the decision by the patient and provider was to proceed with the procedure listed above.  For all procedures, there are risks of bleeding, infection, damage to surrounding organs including but not limited to bowel, bladder, blood vessels, ureters and nerves, and need for further surgery if an injury were to occur. These risks are all low with minimally invasive surgery.   There are risks of numbness and weakness at any body site or buttock/rectal pain.  It is possible that baseline pain can be worsened by surgery, either with or without mesh. If surgery is vaginal, there is also a low risk of possible conversion to laparoscopy or open abdominal incision where indicated. Very rare risks include blood transfusion, blood clot, heart attack, pneumonia, or death.   There is also a risk of short-term postoperative urinary retention with need to use a catheter. About half of patients need to go home from surgery with a catheter, which is then later removed in the office. The risk of long-term need for a catheter is very low. There is also a risk of worsening of overactive bladder.   Prolapse (with or without mesh): Risk factors for surgical failure  include things that put pressure on  your pelvis and the surgical repair, including obesity, chronic cough, and heavy lifting or straining (including lifting children or adults, straining on the toilet, or lifting heavy objects such as furniture or anything weighing >25 lbs. Risks of  recurrence is 20-30% with vaginal native tissue repair and a less than 10% with sacrocolpopexy with mesh.    We discussed consent for blood products. Risks for blood transfusion include allergic reactions, other reactions that can affect different body organs and managed accordingly, transmission of infectious diseases such as HIV or Hepatitis. However, the blood is screened. Patient consents for blood products.  Pre-operative instructions:  She was instructed to not take Aspirin/NSAIDs x 7days prior to surgery. Also instructed to hold Fish oil, Ginger, and garlic supplements. Antibiotic prophylaxis was ordered as indicated.  Catheter use: Patient will go home with foley if needed after post-operative voiding trial.  Post-operative instructions:  She was provided with specific post-operative instructions, including precautions and signs/symptoms for which we would recommend contacting us , in addition to daytime and after-hours contact phone numbers. This was provided on a handout.   Post-operative medications: Prescriptions for motrin , tylenol , Zofran , and oxycodone  were sent to her pharmacy. Discussed using ibuprofen  and tylenol  on a schedule to limit use of narcotics.   Laboratory testing:  We will check labs: AS requested by anesthesia.   Preoperative clearance:  She does not require surgical clearance.    Post-operative follow-up:  A post-operative appointment will be made for 6 weeks from the date of surgery. If she needs a post-operative nurse visit for a voiding trial, that will be set up after she leaves the hospital.    Patient will call the clinic or use MyChart should anything change or any new issues arise.   Jaydn Moscato G Modesta Sammons, NP

## 2023-05-21 NOTE — H&P (Signed)
 [facility] H&P  Subjective Chief Complaint: Natalie Vazquez presents for a preoperative encounter.   History of Present Illness: Natalie Vazquez is a 63 y.o. female who presents for preoperative visit.  She is scheduled to undergo Exam under anesthesia, anterior repair, possible sacrospinous fixation, cystoscopy  on 06/07/23.  Her symptoms include pelvic organ prolapse, and she was was found to have Stage II anterior, Stage I posterior, Stage I apical prolapse.   Simple CMG showed: CMG showed normal sensation, and normal cystometric capacity. Findings negative for stress incontinence, negative for detrusor overactivity.   Past Medical History:  Diagnosis Date   Allergic rhinitis due to other allergen    Allergy    seasonal   Anemia    Arthritis    Asthma    mild, intermittent   Chicken pox    Cholelithiasis    CTS (carpal tunnel syndrome)    Frequent urination    GERD (gastroesophageal reflux disease)    Headache(784.0)    Migraine    PONV (postoperative nausea and vomiting)    difficulty waking up   prediabetes    Problems related to high-risk sexual behavior    Unprotected sex   Symptomatic menopausal or female climacteric states      Past Surgical History:  Procedure Laterality Date   CARPAL TUNNEL RELEASE Right 04/13/2013   CHOLECYSTECTOMY  05/19/2011   Procedure: LAPAROSCOPIC CHOLECYSTECTOMY;  Surgeon: Dann FORBES Hummer, MD;  Location: [facility];  Service: General;  Laterality: N/A;   LAPAROSCOPIC HYSTERECTOMY  04/13/2006   partial, ovaries remain   LASER ABLATION CONDYLOMA CERVICAL / VULVAR     PERCUTANEOUS PINNING  01/22/2012   Procedure: PERCUTANEOUS PINNING EXTREMITY;  Surgeon: Oneil JAYSON Herald, MD;  Location: [facility];  Service: Orthopedics;  Laterality: Right;  Closed Reduction and Pinning right 5th Metacarpal Fracture   TUBAL LIGATION     WISDOM TOOTH EXTRACTION      is allergic to iodine, shellfish allergy, and latex.   Family History  Problem  Relation Age of Onset   Diabetes Mother        Deceased   Hypertension Mother    Anemia Mother    Hyperlipidemia Mother    Arthritis/Rheumatoid Mother    Migraines Mother    CVA Mother    Hypertension Father        Deceased   Stroke Father    Diabetes Father        Legs Amputated   Heart disease Father    Arthritis Father    Cancer Father    Stomach cancer Sister    Breast cancer Sister    Uterine cancer Sister        or Ovarian   Asthma Sister        x2   Diabetes Sister    Hypertension Sister    Heart disease Sister    Sleep apnea Brother    COPD Brother    Sudden death Brother        SIDS   Hypertension Brother    Asthma Brother    Asthma Daughter        x2   Allergies Daughter    Atrial fibrillation Daughter    Anxiety disorder Daughter    Allergies Son    Heart attack Maternal Aunt    Migraines Maternal Aunt    Asthma Maternal Aunt    Diabetes Maternal Uncle    Asthma Maternal Uncle    Sickle cell trait Other  All Children   Bladder Cancer Neg Hx     Social History   Tobacco Use   Smoking status: Never   Smokeless tobacco: Never  Vaping Use   Vaping status: Never Used  Substance Use Topics   Alcohol use: Yes    Comment: rarely    Drug use: No     Review of Systems was negative for a full 10 system review except as noted in the History of Present Illness.  No current facility-administered medications for this encounter.  Current Outpatient Medications:    acetaminophen  (TYLENOL ) 500 MG tablet, Take 1 tablet (500 mg total) by mouth every 6 (six) hours as needed (pain)., Disp: 30 tablet, Rfl: 0   Barberry-Oreg Grape-Goldenseal (BERBERINE COMPLEX PO), Take by mouth., Disp: , Rfl:    Bee Pollen 1000 MG TABS, Take 1 tablet by mouth., Disp: , Rfl:    Biotin 1000 MCG tablet, Take 1,000 mcg by mouth daily. Three times a week, Disp: , Rfl:    cetirizine  (ZYRTEC  ALLERGY) 10 MG tablet, Take 1 tablet (10 mg total) by mouth daily., Disp: 30 tablet,  Rfl: 11   Cholecalciferol (VITAMIN D -3) 25 MCG (1000 UT) CAPS, Take 1 capsule (1,000 Units total) by mouth daily., Disp: 30 capsule, Rfl: 5   cyclobenzaprine  (FLEXERIL ) 10 MG tablet, 1 tablet PO QHS PRN, Disp: 30 tablet, Rfl: 0   EPINEPHrine  0.3 mg/0.3 mL IJ SOAJ injection, Inject 0.3 mg into the muscle as needed for anaphylaxis., Disp: 2 each, Rfl: 1   ibuprofen  (ADVIL ) 600 MG tablet, Take 1 tablet (600 mg total) by mouth every 6 (six) hours as needed., Disp: 30 tablet, Rfl: 0   levalbuterol  (XOPENEX  HFA) 45 MCG/ACT inhaler, INHALE 1 TO 2 PUFFS BY MOUTH EVERY 6 HOURS AS NEEDED FOR WHEEZING, Disp: 15 g, Rfl: 5   lubiprostone  (AMITIZA ) 8 MCG capsule, Take 8 mcg by mouth daily with breakfast., Disp: , Rfl:    NON FORMULARY, Take 3 tablets by mouth as needed. Swiss Kriss Herbal Laxative Tab, Disp: , Rfl:    NON FORMULARY, Golden Thread Suprene 60-50 one time a day, Disp: , Rfl:    NON FORMULARY, Melia supreme 445 mg  one time a day, Disp: , Rfl:    NON FORMULARY, Neuro - mag L-T one a day, Disp: , Rfl:    OLIVE LEAF PO, Take by mouth., Disp: , Rfl:    Olopatadine  HCl 0.2 % SOLN, Apply 1 drop to eye daily as needed (itchy/watery eyes)., Disp: 2.5 mL, Rfl: 11   ondansetron  (ZOFRAN ) 4 MG tablet, Take 1 tablet (4 mg total) by mouth every 8 (eight) hours as needed for nausea or vomiting., Disp: 10 tablet, Rfl: 0   oxyCODONE  (OXY IR/ROXICODONE ) 5 MG immediate release tablet, Take 1 tablet (5 mg total) by mouth every 4 (four) hours as needed for severe pain (pain score 7-10)., Disp: 15 tablet, Rfl: 0   SUMAtriptan  (IMITREX ) 100 MG tablet, Take one tablet by mouth onset of headache, May repeat in 2 hours if headache persists or recurs., Disp: 10 tablet, Rfl: 0   Turmeric 500 MG CAPS, Take 1 tablet by mouth daily., Disp: , Rfl:    vitamin E 180 MG (400 UNITS) capsule, Take 1 capsule by mouth daily., Disp: , Rfl:    zonisamide  (ZONEGRAN ) 25 MG capsule, Take 1 capsule by mouth daily., Disp: , Rfl: 5    Objective There were no vitals filed for this visit.   Gen: NAD CV: S1 S2 RRR  Lungs: Clear to auscultation bilaterally Abd: soft, nontender   Previous Pelvic Exam showed: POP-Q (01/08/23)   1                                            Aa   1                                           Ba   -8                                              C    4                                            Gh   4.5                                            Pb   10                                            tvl    -2.5                                            Ap   -2.5                                            Bp                                                  D         Assessment/ Plan  Assessment: The patient is a 63 y.o. year old scheduled to undergo Exam under anesthesia, anterior repair, possible sacrospinous fixation, cystoscopy . Verbal consent was obtained for these procedures.

## 2023-05-30 ENCOUNTER — Encounter: Payer: Self-pay | Admitting: Obstetrics and Gynecology

## 2023-06-02 ENCOUNTER — Encounter (HOSPITAL_COMMUNITY): Payer: Self-pay | Admitting: Obstetrics and Gynecology

## 2023-06-02 NOTE — Progress Notes (Signed)
Spoke w/ via phone for pre-op interview--- Natalie Vazquez needs dos----CBG and EKG        Vazquez results------ Current labs in EPic CBC, CMP and A1C all dated 05/17/23. COVID test -----patient states asymptomatic no test needed Arrive at -------1330 NPO after MN NO Solid Food.  Clear liquids from MN until---1230 Med rec completed Medications to take morning of surgery ----- Xopenex, Imitrex and Zonegran PRN Diabetic medication ----- Patient instructed no nail polish to be worn day of surgery Patient instructed to bring photo id and insurance card day of surgery Patient aware to have Driver (ride ) / caregiver    for 24 hours after surgery - Driver- Natalie Vazquez and caregiver Natalie Vazquez Patient Special Instructions ----- Pre-Op special Instructions ----- Patient verbalized understanding of instructions that were given at this phone interview. Patient denies chest pain, sob, fever, cough at the interview.

## 2023-06-03 ENCOUNTER — Encounter (INDEPENDENT_AMBULATORY_CARE_PROVIDER_SITE_OTHER): Payer: Self-pay

## 2023-06-03 NOTE — Progress Notes (Signed)
Natalie Vazquez is a 63 y.o. female called w/o success.  FMLA paper were received by the UROGYN dept from [place] [employer] Pt notified there will be a 10-14 day turn around for forms to be ready and  she will be contacted as soon as the from are ready and have been faxed.  Pre-op date: 05/21/23 Surgery date: 06/08/23 Post op date:07/27/2023

## 2023-06-03 NOTE — Telephone Encounter (Signed)
Attempted to contact patient. LMVTRC

## 2023-06-07 ENCOUNTER — Ambulatory Visit (HOSPITAL_COMMUNITY): Payer: Federal, State, Local not specified - PPO | Admitting: Anesthesiology

## 2023-06-07 ENCOUNTER — Ambulatory Visit (HOSPITAL_COMMUNITY)
Admission: RE | Admit: 2023-06-07 | Discharge: 2023-06-07 | Disposition: A | Discharge status: Home / Self Care | Payer: Federal, State, Local not specified - PPO | Attending: Obstetrics and Gynecology | Admitting: Obstetrics and Gynecology

## 2023-06-07 ENCOUNTER — Encounter (HOSPITAL_COMMUNITY): Payer: Self-pay | Admitting: Obstetrics and Gynecology

## 2023-06-07 ENCOUNTER — Encounter: Payer: Self-pay | Admitting: Obstetrics and Gynecology

## 2023-06-07 ENCOUNTER — Other Ambulatory Visit: Payer: Self-pay

## 2023-06-07 ENCOUNTER — Encounter (HOSPITAL_COMMUNITY)
Admission: RE | Disposition: A | Discharge status: Home / Self Care | Payer: Self-pay | Source: Home / Self Care | Attending: Obstetrics and Gynecology

## 2023-06-07 DIAGNOSIS — G4730 Sleep apnea, unspecified: Secondary | ICD-10-CM | POA: Diagnosis not present

## 2023-06-07 DIAGNOSIS — N8110 Cystocele, unspecified: Secondary | ICD-10-CM | POA: Diagnosis not present

## 2023-06-07 DIAGNOSIS — E119 Type 2 diabetes mellitus without complications: Secondary | ICD-10-CM | POA: Insufficient documentation

## 2023-06-07 DIAGNOSIS — N993 Prolapse of vaginal vault after hysterectomy: Secondary | ICD-10-CM | POA: Diagnosis not present

## 2023-06-07 DIAGNOSIS — R7303 Prediabetes: Secondary | ICD-10-CM

## 2023-06-07 HISTORY — PX: CYSTOSCOPY: SHX5120

## 2023-06-07 HISTORY — DX: Sleep apnea, unspecified: G47.30

## 2023-06-07 HISTORY — PX: ANTERIOR AND POSTERIOR REPAIR WITH SACROSPINOUS FIXATION: SHX6536

## 2023-06-07 LAB — GLUCOSE, CAPILLARY
Glucose-Capillary: 76 mg/dL (ref 70–99)
Glucose-Capillary: 117 mg/dL — ABNORMAL HIGH (ref 70–99)

## 2023-06-07 SURGERY — ANTERIOR AND POSTERIOR REPAIR WITH SACROSPINOUS FIXATION
Anesthesia: General

## 2023-06-07 MED ORDER — LIDOCAINE-EPINEPHRINE 1 %-1:100000 IJ SOLN
INTRAMUSCULAR | Status: AC
  Filled 2023-06-07: qty 2

## 2023-06-07 MED ORDER — SUGAMMADEX SODIUM 200 MG/2ML IV SOLN
INTRAVENOUS | Status: DC | PRN
  Administered 2023-06-07: 200 mg via INTRAVENOUS

## 2023-06-07 MED ORDER — DEXAMETHASONE SODIUM PHOSPHATE 10 MG/ML IJ SOLN
INTRAMUSCULAR | Status: DC | PRN
  Administered 2023-06-07: 10 mg via INTRAVENOUS

## 2023-06-07 MED ORDER — ACETAMINOPHEN 10 MG/ML IV SOLN: 1000.0000 mg | Freq: Once | INTRAVENOUS | Status: DC | PRN

## 2023-06-07 MED ORDER — ONDANSETRON HCL 4 MG/2ML IJ SOLN
INTRAMUSCULAR | Status: AC
  Filled 2023-06-07: qty 2

## 2023-06-07 MED ORDER — ONDANSETRON HCL 4 MG/2ML IJ SOLN
INTRAMUSCULAR | Status: DC | PRN
  Administered 2023-06-07: 4 mg via INTRAVENOUS

## 2023-06-07 MED ORDER — FENTANYL CITRATE (PF) 250 MCG/5ML IJ SOLN
INTRAMUSCULAR | Status: DC | PRN
  Administered 2023-06-07: 150 ug via INTRAVENOUS
  Administered 2023-06-07 (×2): 50 ug via INTRAVENOUS

## 2023-06-07 MED ORDER — DEXAMETHASONE SODIUM PHOSPHATE 10 MG/ML IJ SOLN
INTRAMUSCULAR | Status: AC
  Filled 2023-06-07: qty 1

## 2023-06-07 MED ORDER — PHENAZOPYRIDINE HCL 200 MG PO TABS
200.0000 mg | ORAL_TABLET | ORAL | Status: AC
  Administered 2023-06-07: 200 mg via ORAL
  Filled 2023-06-07: qty 1

## 2023-06-07 MED ORDER — MIDAZOLAM HCL 2 MG/2ML IJ SOLN
INTRAMUSCULAR | Status: AC
  Filled 2023-06-07: qty 2

## 2023-06-07 MED ORDER — LIDOCAINE-EPINEPHRINE 1 %-1:100000 IJ SOLN
INTRAMUSCULAR | Status: DC | PRN
  Administered 2023-06-07: 20 mL

## 2023-06-07 MED ORDER — FENTANYL CITRATE (PF) 250 MCG/5ML IJ SOLN
INTRAMUSCULAR | Status: AC
  Filled 2023-06-07: qty 5

## 2023-06-07 MED ORDER — ACETAMINOPHEN 325 MG PO TABS: 325.0000 mg | ORAL_TABLET | ORAL | Status: DC | PRN

## 2023-06-07 MED ORDER — FENTANYL CITRATE (PF) 100 MCG/2ML IJ SOLN: 25.0000 ug | INTRAMUSCULAR | Status: DC | PRN

## 2023-06-07 MED ORDER — PROPOFOL 10 MG/ML IV BOLUS
INTRAVENOUS | Status: DC | PRN
  Administered 2023-06-07: 150 mg via INTRAVENOUS

## 2023-06-07 MED ORDER — PHENYLEPHRINE 80 MCG/ML (10ML) SYRINGE FOR IV PUSH (FOR BLOOD PRESSURE SUPPORT)
PREFILLED_SYRINGE | INTRAVENOUS | Status: AC
  Filled 2023-06-07: qty 10

## 2023-06-07 MED ORDER — ORAL CARE MOUTH RINSE: 15.0000 mL | Freq: Once | OROMUCOSAL | Status: AC

## 2023-06-07 MED ORDER — PHENAZOPYRIDINE HCL 100 MG PO TABS
ORAL_TABLET | ORAL | Status: AC
  Filled 2023-06-07: qty 2

## 2023-06-07 MED ORDER — SCOPOLAMINE 1 MG/3DAYS TD PT72
1.0000 | MEDICATED_PATCH | TRANSDERMAL | Status: DC
  Administered 2023-06-07: 1.5 mg via TRANSDERMAL
  Filled 2023-06-07: qty 1

## 2023-06-07 MED ORDER — OXYCODONE HCL 5 MG/5ML PO SOLN: 5.0000 mg | Freq: Once | ORAL | Status: DC | PRN

## 2023-06-07 MED ORDER — ACETAMINOPHEN 160 MG/5ML PO SOLN: 325.0000 mg | ORAL | Status: DC | PRN

## 2023-06-07 MED ORDER — KETOROLAC TROMETHAMINE 30 MG/ML IJ SOLN
INTRAMUSCULAR | Status: DC | PRN
  Administered 2023-06-07: 30 mg via INTRAVENOUS

## 2023-06-07 MED ORDER — CHLORHEXIDINE GLUCONATE 0.12 % MT SOLN
15.0000 mL | Freq: Once | OROMUCOSAL | Status: AC
  Administered 2023-06-07: 15 mL via OROMUCOSAL
  Filled 2023-06-07: qty 15

## 2023-06-07 MED ORDER — PROPOFOL 10 MG/ML IV BOLUS
INTRAVENOUS | Status: AC
  Filled 2023-06-07: qty 20

## 2023-06-07 MED ORDER — LACTATED RINGERS IV SOLN: INTRAVENOUS | Status: DC

## 2023-06-07 MED ORDER — ROCURONIUM BROMIDE 10 MG/ML (PF) SYRINGE
PREFILLED_SYRINGE | INTRAVENOUS | Status: AC
  Filled 2023-06-07: qty 10

## 2023-06-07 MED ORDER — PROPOFOL 500 MG/50ML IV EMUL
INTRAVENOUS | Status: DC | PRN
  Administered 2023-06-07: 20 ug/kg/min via INTRAVENOUS

## 2023-06-07 MED ORDER — CEFAZOLIN SODIUM-DEXTROSE 2-4 GM/100ML-% IV SOLN
2.0000 g | INTRAVENOUS | Status: AC
  Administered 2023-06-07: 2 g via INTRAVENOUS
  Filled 2023-06-07: qty 100

## 2023-06-07 MED ORDER — LIDOCAINE 2% (20 MG/ML) 5 ML SYRINGE
INTRAMUSCULAR | Status: DC | PRN
  Administered 2023-06-07: 60 mg via INTRAVENOUS

## 2023-06-07 MED ORDER — ACETAMINOPHEN 500 MG PO TABS
1000.0000 mg | ORAL_TABLET | ORAL | Status: AC
  Administered 2023-06-07: 1000 mg via ORAL
  Filled 2023-06-07: qty 2

## 2023-06-07 MED ORDER — DROPERIDOL 2.5 MG/ML IJ SOLN: 0.6250 mg | Freq: Once | INTRAMUSCULAR | Status: DC | PRN

## 2023-06-07 MED ORDER — DEXMEDETOMIDINE HCL IN NACL 80 MCG/20ML IV SOLN
INTRAVENOUS | Status: DC | PRN
  Administered 2023-06-07: 4 ug via INTRAVENOUS

## 2023-06-07 MED ORDER — MIDAZOLAM HCL 5 MG/5ML IJ SOLN
INTRAMUSCULAR | Status: DC | PRN
  Administered 2023-06-07: 2 mg via INTRAVENOUS

## 2023-06-07 MED ORDER — ROCURONIUM BROMIDE 10 MG/ML (PF) SYRINGE
PREFILLED_SYRINGE | INTRAVENOUS | Status: DC | PRN
  Administered 2023-06-07: 10 mg via INTRAVENOUS
  Administered 2023-06-07: 40 mg via INTRAVENOUS

## 2023-06-07 MED ORDER — SODIUM CHLORIDE 0.9 % IR SOLN
Status: DC | PRN
  Administered 2023-06-07: 1000 mL

## 2023-06-07 MED ORDER — OXYCODONE HCL 5 MG PO TABS: 5.0000 mg | ORAL_TABLET | Freq: Once | ORAL | Status: DC | PRN

## 2023-06-07 SURGICAL SUPPLY — 34 items
BAG DRAIN URO-CYSTO SKYTR STRL (DRAIN) IMPLANT
BLADE SURG 15 STRL LF DISP TIS (BLADE) ×1 IMPLANT
CATH SILICONE 16FRX5CC (CATHETERS) IMPLANT
DEVICE CAPIO SLIM SINGLE (INSTRUMENTS) IMPLANT
ELECT REM PT RETURN 9FT ADLT (ELECTROSURGICAL) IMPLANT
ELECTRODE REM PT RTRN 9FT ADLT (ELECTROSURGICAL) IMPLANT
GAUZE STRIP PACKING 2INX5YD (MISCELLANEOUS) IMPLANT
GLOVE BIO SURGEON STRL SZ 6 (GLOVE) ×1 IMPLANT
GLOVE BIOGEL PI IND STRL 6.5 (GLOVE) ×1 IMPLANT
GLOVE ECLIPSE 6.0 STRL STRAW (GLOVE) ×1 IMPLANT
GOWN STRL REUS W/TWL LRG LVL3 (GOWN DISPOSABLE) ×1 IMPLANT
HIBICLENS CHG 4% 4OZ BTL (MISCELLANEOUS) ×1 IMPLANT
KIT TURNOVER CYSTO (KITS) ×1 IMPLANT
KIT TURNOVER KIT B (KITS) ×1 IMPLANT
MANIFOLD NEPTUNE II (INSTRUMENTS) ×1 IMPLANT
NDL HYPO 22X1.5 SAFETY MO (MISCELLANEOUS) ×1 IMPLANT
NDL MAYO 6 CRC TAPER PT (NEEDLE) IMPLANT
NEEDLE HYPO 22X1.5 SAFETY MO (MISCELLANEOUS) ×1 IMPLANT
NEEDLE MAYO 6 CRC TAPER PT (NEEDLE) ×1 IMPLANT
NS IRRIG 1000ML POUR BTL (IV SOLUTION) ×1 IMPLANT
PACK CYSTO (CUSTOM PROCEDURE TRAY) ×1 IMPLANT
PACK VAGINAL WOMENS (CUSTOM PROCEDURE TRAY) ×1 IMPLANT
RETRACTOR LONE STAR DISPOSABLE (INSTRUMENTS) ×1 IMPLANT
RETRACTOR STAY HOOK 5MM (MISCELLANEOUS) ×1 IMPLANT
SET CYSTO W/LG BORE CLAMP LF (SET/KITS/TRAYS/PACK) ×1 IMPLANT
SLEEVE SCD COMPRESS KNEE MED (STOCKING) ×1 IMPLANT
SPIKE FLUID TRANSFER (MISCELLANEOUS) IMPLANT
SURGIFLO W/THROMBIN 8M KIT (HEMOSTASIS) IMPLANT
SUT ABS MONO DBL WITH NDL 48IN (SUTURE) IMPLANT
SUT MON AB 2-0 SH27 (SUTURE) IMPLANT
SUT VIC AB 2-0 SH 27XBRD (SUTURE) ×1 IMPLANT
SUT VIC AB 3-0 SH 18 (SUTURE) IMPLANT
TOWEL GREEN STERILE (TOWEL DISPOSABLE) ×2 IMPLANT
TRAY FOL W/BAG SLVR 16FR STRL (SET/KITS/TRAYS/PACK) ×1 IMPLANT

## 2023-06-07 NOTE — Discharge Instructions (Signed)

## 2023-06-07 NOTE — Anesthesia Postprocedure Evaluation (Signed)
 Anesthesia Post Note  Patient: Bevin R Sherlin  Procedure(s) Performed: ANTERIOR REPAIR WITH SACROSPINOUS FIXATION CYSTOSCOPY     Patient location during evaluation: PACU Anesthesia Type: General Level of consciousness: awake and alert Pain management: pain level controlled Vital Signs Assessment: post-procedure vital signs reviewed and stable Respiratory status: spontaneous breathing, nonlabored ventilation and respiratory function stable Cardiovascular status: blood pressure returned to baseline and stable Postop Assessment: no apparent nausea or vomiting Anesthetic complications: no   No notable events documented.  Last Vitals:  Vitals:   06/07/23 1845 06/07/23 1900  BP: (!) 146/90 (!) 141/78  Pulse: 70 60  Resp: 13 15  Temp:  36.5 C  SpO2: 95% 96%    Last Pain:  Vitals:   06/07/23 1845  TempSrc:   PainSc: Asleep                 Collene Schlichter

## 2023-06-07 NOTE — Op Note (Signed)
 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 On vaginal exam, stage II prolapse present  2. On cystoscopy, normal bladder and urethral mucosa without injury or lesion. Brisk bilateral ureteral efflux present.    Specimens: none  Estimated blood loss: 50 mL  IV fluids: see flowsheet  Urine output: 250 mL  Complications: none  Procedure in Detail:  After informed consent was obtained, the patient was taken to the operating room where anesthesia was induced and found to be adequate. She was placed in dorsal lithotomy position, taking care to avoid any traction on the extremities, and then prepped and draped in the usual sterile fashion. A self-retaining lonestar retractor was placed using four elastic blue stays.  After a foley catheter was inserted into the urethra, the location of the midurethra was palpated. Two Allis clamps were along the anterior vaginal wall defect. 1% lidocaine with epinephrine was injected into the vaginal mucosa.  A vertical incision was made between these two Allis clamps with a 15 blade scalpel.  Allis clamps were placed along this incision and Metzenbaum scissors were used to undermine the vaginal mucosa along the incision.  The vaginal mucosa was then sharply dissected off to the vesicovaginal septum bilaterally to the level of the pubic rami.    For the sacrospinous ligament fixation (SSLF), the ischial spine was accessed on the right side via dissection with Metzenbaum scissors and blunt dissection.  The sacrospinous ligament was palpated. Two 0 PDS suture was then placed at the sacrospinous ligament two fingerbreadths medial to the ischial spine, in order to avoid the pudendal  neurovascular bundle, using a Capio needle driver.  The PDS suture was attached to the vaginal epithelium on the ipsilateral side of the vaginal apex and held. Anterior plication of the vesicovaginal septum was then performed using mattress sutures of 2-0 Vicryl. The vaginal mucosal edges were trimmed and the incision reapproximated with 2-0 Vicryl in a running fashion. A vaginal sulcus tear was noted on the right side and this was repaired with a 2-0 vicryl in a running fashion. The SSLF suture was then tied down with excellent support of the anterior and apical vagina.   The Foley catheter was removed.  A 70-degree cystoscope was introduced, and 360-degree inspection revealed no trauma to the bladder, with bilateral ureteral efflux.  The bladder was drained and the cystoscope was removed.  The Foley catheter was reinserted. Sutures were cut. Irrigation was performed and good hemostasis was noted. The patient tolerated the procedure well.  She was awakened from anesthesia and transferred to the recovery room in stable condition. All counts were correct x 2.    Marguerita Beards, MD

## 2023-06-07 NOTE — Anesthesia Procedure Notes (Signed)
 Procedure Name: Intubation Date/Time: 06/07/2023 5:08 PM  Performed by: Thomasene Ripple, CRNAPre-anesthesia Checklist: Patient identified, Emergency Drugs available, Suction available and Patient being monitored Patient Re-evaluated:Patient Re-evaluated prior to induction Oxygen Delivery Method: Circle System Utilized Preoxygenation: Pre-oxygenation with 100% oxygen Induction Type: IV induction Ventilation: Mask ventilation without difficulty Laryngoscope Size: Miller and 3 Grade View: Grade I Tube type: Oral Tube size: 7.0 mm Number of attempts: 1 Airway Equipment and Method: Stylet and Oral airway Placement Confirmation: ETT inserted through vocal cords under direct vision, positive ETCO2 and breath sounds checked- equal and bilateral Secured at: 21 cm Tube secured with: Tape Dental Injury: Teeth and Oropharynx as per pre-operative assessment

## 2023-06-07 NOTE — Transfer of Care (Signed)
 Immediate Anesthesia Transfer of Care Note  Patient: Natalie Vazquez  Procedure(s) Performed: ANTERIOR REPAIR WITH SACROSPINOUS FIXATION CYSTOSCOPY  Patient Location: PACU  Anesthesia Type:General  Level of Consciousness: awake, alert , and oriented  Airway & Oxygen Therapy: Patient Spontanous Breathing and Patient connected to face mask oxygen  Post-op Assessment: Report given to RN and Post -op Vital signs reviewed and stable  Post vital signs: Reviewed and stable  Last Vitals:  Vitals Value Taken Time  BP 130/81 06/07/23 1830  Temp    Pulse 75 06/07/23 1832  Resp 14 06/07/23 1832  SpO2 94 % 06/07/23 1832  Vitals shown include unfiled device data.  Last Pain:  Vitals:   06/07/23 1336  TempSrc: Oral  PainSc: 0-No pain      Patients Stated Pain Goal: 7 (06/07/23 1336)  Complications: No notable events documented.

## 2023-06-07 NOTE — Interval H&P Note (Signed)
 History and Physical Interval Note:  06/07/2023 4:44 PM  Natalie Vazquez  has presented today for surgery, with the diagnosis of prolapse of vaginal vault after hysterectomy.  The various methods of treatment have been discussed with the patient and family. After consideration of risks, benefits and other options for treatment, the patient has consented to  Procedure(s) with comments: ANTERIOR REPAIR WITH SACROSPINOUS FIXATION (N/A)  CYSTOSCOPY (N/A) as a surgical intervention.  The patient's history has been reviewed, patient examined, no change in status, stable for surgery.  I have reviewed the patient's chart and labs.  Questions were answered to the patient's satisfaction.     Marguerita Beards

## 2023-06-07 NOTE — Anesthesia Preprocedure Evaluation (Addendum)
 Anesthesia Evaluation  Patient identified by MRN, date of birth, ID band Patient awake    Reviewed: Allergy & Precautions, NPO status , Patient's Chart, lab work & pertinent test results, reviewed documented beta blocker date and time   History of Anesthesia Complications (+) PONV and history of anesthetic complications  Airway Mallampati: II  TM Distance: >3 FB     Dental no notable dental hx.    Pulmonary asthma , sleep apnea and Continuous Positive Airway Pressure Ventilation , neg COPD   breath sounds clear to auscultation       Cardiovascular (-) hypertension(-) angina (-) CAD and (-) Past MI negative cardio ROS  Rhythm:Regular Rate:Normal     Neuro/Psych  Headaches, neg Seizures  Neuromuscular disease  negative psych ROS   GI/Hepatic Neg liver ROS,GERD  ,,(+) neg Cirrhosis        Endo/Other  diabetes    Renal/GU Renal diseasenegative Renal ROS     Musculoskeletal  (+) Arthritis ,    Abdominal   Peds  Hematology  (+) Blood dyscrasia, anemia   Anesthesia Other Findings   Reproductive/Obstetrics                             Anesthesia Physical Anesthesia Plan  ASA: 2  Anesthesia Plan: General   Post-op Pain Management: Tylenol PO (pre-op)* and Toradol IV (intra-op)*   Induction: Intravenous  PONV Risk Score and Plan: 4 or greater and Ondansetron, Dexamethasone and Propofol infusion  Airway Management Planned: Oral ETT  Additional Equipment: None  Intra-op Plan:   Post-operative Plan: Extubation in OR  Informed Consent: I have reviewed the patients History and Physical, chart, labs and discussed the procedure including the risks, benefits and alternatives for the proposed anesthesia with the patient or authorized representative who has indicated his/her understanding and acceptance.     Dental advisory given  Plan Discussed with: CRNA  Anesthesia Plan Comments:         Anesthesia Quick Evaluation

## 2023-06-08 ENCOUNTER — Telehealth: Payer: Self-pay | Admitting: Obstetrics and Gynecology

## 2023-06-08 ENCOUNTER — Encounter (HOSPITAL_COMMUNITY): Payer: Self-pay | Admitting: Obstetrics and Gynecology

## 2023-06-08 NOTE — Telephone Encounter (Signed)
 Patient has been scheduled for Wednesday 06-09-2023. You did say March in the message! Just double checking.

## 2023-06-08 NOTE — Telephone Encounter (Signed)
 Natalie Vazquez underwent Anterior repair, sacrospinous ligament fixation, cystoscopy on 06/08/23.   She failed her voiding trial.  was backfilled into the bladder Voided  PVR by bladder scan was .   She was discharged with a catheter. Please call her for a routine post op check and to schedule a voiding trial by Thurs or Fri (3/27-28). Thanks!  Marguerita Beards, MD

## 2023-06-09 ENCOUNTER — Ambulatory Visit (INDEPENDENT_AMBULATORY_CARE_PROVIDER_SITE_OTHER): Payer: Federal, State, Local not specified - PPO

## 2023-06-09 DIAGNOSIS — Z48816 Encounter for surgical aftercare following surgery on the genitourinary system: Secondary | ICD-10-CM

## 2023-06-09 NOTE — Progress Notes (Signed)
 Natalie Vazquez came for a voiding trial today. Her bladder was filled with of water. Patient voided 400. Patient states she has been doing well and using Tylenol and Ibuprofen for pain. She has had several bowel movements since surgery. She does have some blood still on her pad still, but not soaking through. Pt to call the office if she cannot urinate.

## 2023-06-09 NOTE — Patient Instructions (Signed)
 Please keep all scheduled follow ups.  It was a pleasure to see you today!  Thank you for trusting me with your care!

## 2023-06-19 ENCOUNTER — Telehealth: Payer: Self-pay | Admitting: Obstetrics and Gynecology

## 2023-06-19 DIAGNOSIS — R112 Nausea with vomiting, unspecified: Secondary | ICD-10-CM

## 2023-06-19 MED ORDER — ONDANSETRON 8 MG PO TBDP: 8.0000 mg | ORAL_TABLET | Freq: Three times a day (TID) | ORAL | 0 refills | Status: DC | PRN

## 2023-06-19 NOTE — Telephone Encounter (Signed)
 Anterior repair, sacrospinous ligament fixation and cystoscopy on 06/07/23 complicated by postop urinary retention. Foley removed with successful voiding trial on POD#1.  Patient paged on call provider reporting lower back pain and emesis that started overnight. +Sick contact: GI bug She took acetaminophen 2am for lower back pain that was radiating to the bilateral buttocks. She then had ongoing emesis that started at 3am. Symptoms improved with dissolvable zofran (daughter's prescription) taken at 11am- 8mg . She was unable to keep down her zofran rx.  She was able to sleep today with once additional episode of emesis. She has recently been drinking Pedialyte and water, which have stayed down.  Lower back pain currently a 4-5/10. Current temp 99.61F. Urine and BM normal.  Recommend continued PO hydration. PO ibuprofen for pain. Will send in rx for dissolvable zofran. Trial of H2 blocker to help settle stomach.  Reviewed if she is unable to tolerate PO or pain is unimproved with ibuprofen, she should proceed to ED. All questions answered.

## 2023-06-21 NOTE — Telephone Encounter (Signed)
 Called and spoke to patient. She sounded good but tired. Overall reports minimal oozing blood and pain is better controlled. States she is drinking ginger tea and does not feel like she needs anything at this time. Encouraged her to call if she needs any support or feels she needs [place] to check the surgical site. Encouraged her to get electrolytes in as well.

## 2023-06-23 ENCOUNTER — Encounter: Payer: Self-pay | Admitting: Obstetrics and Gynecology

## 2023-06-29 ENCOUNTER — Ambulatory Visit (INDEPENDENT_AMBULATORY_CARE_PROVIDER_SITE_OTHER): Admitting: Obstetrics and Gynecology

## 2023-06-29 ENCOUNTER — Encounter: Payer: Self-pay | Admitting: Obstetrics and Gynecology

## 2023-06-29 VITALS — BP 139/86 | HR 63

## 2023-06-29 DIAGNOSIS — Z9889 Other specified postprocedural states: Secondary | ICD-10-CM

## 2023-06-29 DIAGNOSIS — Z48816 Encounter for surgical aftercare following surgery on the genitourinary system: Secondary | ICD-10-CM

## 2023-06-29 MED ORDER — ESTRADIOL 0.1 MG/GM VA CREA: TOPICAL_CREAM | VAGINAL | 11 refills | Status: DC

## 2023-06-29 NOTE — Progress Notes (Signed)
 [facility]  Date of Visit: 06/29/2023  History of Present Illness: Ms. Natalie Vazquez is a 63 y.o. female scheduled today for a post-operative visit.   Surgery: s/p Anterior repair, sacrospinous ligament fixation, cystoscopy on 06/07/23  She did not pass her postoperative void trial. Returned to office on 06/09/23 and passed repeat trial.   She started having some bleeding the day after surgery. She does not fill a whole overnight pad in a day. Has not noticed any clots. She is not having urine leakage. She feels she is having more urgency though. Can hold her urine about every 4 hours. Has more urgency at night and wakes up about once- this is not new since surgery though. Also had a GI bug last week and symptoms have now improved.   Drinks- 1 cup green tea, water and juice. Drinks water before she goes to bed and sometimes drinks in the middle of the night.   Medications: She has a current medication list which includes the following prescription(s): acetaminophen, bee pollen, berberine chloride, biotin, cetirizine, cyclobenzaprine, epinephrine, [START ON 07/01/2023] estradiol, oscillococcinum, ibuprofen, levalbuterol, misc natural products, NON FORMULARY, NON FORMULARY, NON FORMULARY, olive leaf, olopatadine hcl, omega-3 fatty acids, ondansetron, ondansetron, sumatriptan, vitamin e, zonisamide, and oxycodone.   Allergies: Patient is allergic to iodine, shellfish allergy, other, and latex.   Physical Exam: BP 139/86   Pulse 63    Pelvic Examination: Vagina: Incisions healing well. Sutures are present at incision line and there is not granulation tissue. Slight oozing present from anterior vaginal incision line. Silver nitrate applied.   ---------------------------------------------------------  Assessment and Plan:  1. Post-operative state     - Advised to start vaginal estrace cream, 0.5g twice a week to help with healing.  - monitor bleeding. Should improve with application of  silver nitrate.  - avoid drinking prior to bedtime and at night. Also avoid caffeine as this can increase bladder urgency.   Follow up 1 month or sooner if needed  Marguerita Beards, MD

## 2023-06-29 NOTE — Patient Instructions (Signed)

## 2023-06-30 ENCOUNTER — Encounter (HOSPITAL_COMMUNITY): Payer: Self-pay | Admitting: Obstetrics and Gynecology

## 2023-06-30 DIAGNOSIS — M1712 Unilateral primary osteoarthritis, left knee: Secondary | ICD-10-CM | POA: Insufficient documentation

## 2023-06-30 DIAGNOSIS — M25562 Pain in left knee: Secondary | ICD-10-CM | POA: Diagnosis not present

## 2023-07-02 DIAGNOSIS — R262 Difficulty in walking, not elsewhere classified: Secondary | ICD-10-CM | POA: Diagnosis not present

## 2023-07-02 DIAGNOSIS — R29898 Other symptoms and signs involving the musculoskeletal system: Secondary | ICD-10-CM | POA: Diagnosis not present

## 2023-07-02 DIAGNOSIS — M25662 Stiffness of left knee, not elsewhere classified: Secondary | ICD-10-CM | POA: Diagnosis not present

## 2023-07-02 DIAGNOSIS — M25562 Pain in left knee: Secondary | ICD-10-CM | POA: Diagnosis not present

## 2023-07-10 DIAGNOSIS — J209 Acute bronchitis, unspecified: Secondary | ICD-10-CM | POA: Diagnosis not present

## 2023-07-10 DIAGNOSIS — R059 Cough, unspecified: Secondary | ICD-10-CM | POA: Diagnosis not present

## 2023-07-10 DIAGNOSIS — Z20822 Contact with and (suspected) exposure to covid-19: Secondary | ICD-10-CM | POA: Diagnosis not present

## 2023-07-10 DIAGNOSIS — J029 Acute pharyngitis, unspecified: Secondary | ICD-10-CM | POA: Diagnosis not present

## 2023-07-23 DIAGNOSIS — R29898 Other symptoms and signs involving the musculoskeletal system: Secondary | ICD-10-CM | POA: Diagnosis not present

## 2023-07-23 DIAGNOSIS — R262 Difficulty in walking, not elsewhere classified: Secondary | ICD-10-CM | POA: Diagnosis not present

## 2023-07-23 DIAGNOSIS — M25562 Pain in left knee: Secondary | ICD-10-CM | POA: Diagnosis not present

## 2023-07-23 DIAGNOSIS — M25662 Stiffness of left knee, not elsewhere classified: Secondary | ICD-10-CM | POA: Diagnosis not present

## 2023-07-27 ENCOUNTER — Ambulatory Visit (INDEPENDENT_AMBULATORY_CARE_PROVIDER_SITE_OTHER): Payer: Federal, State, Local not specified - PPO | Admitting: Obstetrics and Gynecology

## 2023-07-27 ENCOUNTER — Encounter: Payer: Self-pay | Admitting: Obstetrics and Gynecology

## 2023-07-27 ENCOUNTER — Other Ambulatory Visit (HOSPITAL_COMMUNITY)
Admission: RE | Admit: 2023-07-27 | Discharge: 2023-07-27 | Disposition: A | Discharge status: Home / Self Care | Source: Ambulatory Visit | Attending: Obstetrics and Gynecology | Admitting: Obstetrics and Gynecology

## 2023-07-27 VITALS — BP 138/94 | HR 80

## 2023-07-27 DIAGNOSIS — N898 Other specified noninflammatory disorders of vagina: Secondary | ICD-10-CM | POA: Insufficient documentation

## 2023-07-27 DIAGNOSIS — Z48816 Encounter for surgical aftercare following surgery on the genitourinary system: Secondary | ICD-10-CM

## 2023-07-27 DIAGNOSIS — N8110 Cystocele, unspecified: Secondary | ICD-10-CM

## 2023-07-27 NOTE — Progress Notes (Signed)
 [facility]  Date of Visit: 07/27/2023  History of Present Illness: Ms. Surman is a 63 y.o. female scheduled today for a post-operative visit.   Surgery: Surgery: s/p Anterior repair, sacrospinous ligament fixation, cystoscopy on 06/07/23  She did not pass her postoperative void trial. Returned to office on 06/09/23 and passed repeat trial.    Postoperative course has been uncomplicated.   Today she reports she has been doing well. Has not noticed any bleeding. Using the estrace cream twice a week. She may feel a bulge when puts the cream but does not notice anything when walking around.   UTI in the last 6 weeks? No  Pain? No  She has returned to her normal activity (except for postop restrictions) Vaginal bulge?  unsure Stress incontinence: No  Urgency/frequency: No  Urge incontinence: No  Voiding dysfunction: No  Bowel issues: No   Subjective Success: Do you usually have a bulge or something falling out that you can see or feel in the vaginal area? No  Retreatment Success: Any retreatment with surgery or pessary for any compartment? No    Medications: She has a current medication list which includes the following prescription(s): acetaminophen, bee pollen, berberine chloride, biotin, cetirizine, epinephrine, estradiol, oscillococcinum, ibuprofen, levalbuterol, misc natural products, NON FORMULARY, NON FORMULARY, NON FORMULARY, olive leaf, olopatadine hcl, omega-3 fatty acids, sumatriptan, vitamin e, zonisamide, cyclobenzaprine, ondansetron, ondansetron, and oxycodone.   Allergies: Patient is allergic to iodine, shellfish allergy, other, and latex.   Physical Exam: BP (!) 138/94 (BP Location: Left Arm, Patient Position: Sitting)   Pulse 80    Pelvic Examination: Vagina: Incisions healing well. Yellow discharge present, aptima swab obtained. Sutures are present at the cuff and there is a small amount of granulation tissue, treated with silver nitrate. No tenderness  along the anterior or posterior vagina. No apical tenderness. No pelvic masses.   POP-Q: POP-Q  -2                                            Aa   -2                                           Ba  -6.5                                              C   3.5                                            Gh  4                                            Pb  7  tvl   -2                                            Ap  -2                                            Bp                                                 D    ---------------------------------------------------------  Assessment and Plan:  1. Prolapse of anterior vaginal wall   2. Vaginal discharge    - Well healed overall. Sutures at apex will dissolve over time.  - Can resume regular activity including exercise and intercourse,  if desired. Discussed avoidance of heavy lifting and straining long term to reduce the risk of recurrence.  - She has stage 1 anterior prolapse present, which is an improvement from prior to surgery. We discussed pelvic PT as a way to prevent further progression.  - Return to work letter provided.   Follow up as needed  Arma Lamp, MD

## 2023-07-28 LAB — CERVICOVAGINAL ANCILLARY ONLY
Bacterial Vaginitis (gardnerella): NEGATIVE
Candida Glabrata: NEGATIVE
Candida Vaginitis: NEGATIVE
Comment: NEGATIVE
Comment: NEGATIVE
Comment: NEGATIVE

## 2023-07-30 DIAGNOSIS — R262 Difficulty in walking, not elsewhere classified: Secondary | ICD-10-CM | POA: Diagnosis not present

## 2023-07-30 DIAGNOSIS — M25562 Pain in left knee: Secondary | ICD-10-CM | POA: Diagnosis not present

## 2023-07-30 DIAGNOSIS — M25662 Stiffness of left knee, not elsewhere classified: Secondary | ICD-10-CM | POA: Diagnosis not present

## 2023-07-30 DIAGNOSIS — R29898 Other symptoms and signs involving the musculoskeletal system: Secondary | ICD-10-CM | POA: Diagnosis not present

## 2023-08-06 DIAGNOSIS — M25662 Stiffness of left knee, not elsewhere classified: Secondary | ICD-10-CM | POA: Diagnosis not present

## 2023-08-06 DIAGNOSIS — M25562 Pain in left knee: Secondary | ICD-10-CM | POA: Diagnosis not present

## 2023-08-06 DIAGNOSIS — R29898 Other symptoms and signs involving the musculoskeletal system: Secondary | ICD-10-CM | POA: Diagnosis not present

## 2023-08-06 DIAGNOSIS — R262 Difficulty in walking, not elsewhere classified: Secondary | ICD-10-CM | POA: Diagnosis not present

## 2023-10-14 ENCOUNTER — Encounter: Payer: Self-pay | Admitting: Physician Assistant

## 2023-10-18 ENCOUNTER — Other Ambulatory Visit: Payer: Self-pay | Admitting: Physician Assistant

## 2023-10-18 ENCOUNTER — Other Ambulatory Visit: Payer: Self-pay

## 2023-10-18 DIAGNOSIS — G43019 Migraine without aura, intractable, without status migrainosus: Secondary | ICD-10-CM | POA: Diagnosis not present

## 2023-10-18 MED ORDER — CETIRIZINE HCL 10 MG PO TABS: 10.0000 mg | ORAL_TABLET | Freq: Every day | ORAL | 1 refills | Status: DC

## 2023-10-26 DIAGNOSIS — M549 Dorsalgia, unspecified: Secondary | ICD-10-CM | POA: Diagnosis not present
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
## 2023-11-04 ENCOUNTER — Encounter: Admitting: Physical Therapy
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
## 2023-11-18 ENCOUNTER — Encounter: Attending: Obstetrics and Gynecology | Admitting: Physical Therapy

## 2023-11-18 ENCOUNTER — Other Ambulatory Visit: Payer: Self-pay

## 2023-11-18 ENCOUNTER — Encounter: Payer: Self-pay | Admitting: Physical Therapy

## 2023-11-18 DIAGNOSIS — M6281 Muscle weakness (generalized): Secondary | ICD-10-CM | POA: Insufficient documentation

## 2023-11-18 NOTE — Patient Instructions (Signed)
 Moisturizers They are used in the vagina to hydrate the mucous membrane that make up the vaginal canal. Designed to keep a more normal acid balance (ph) Once placed in the vagina, it will last between two to three days.  Use 2-3 times per week at bedtime  Ingredients to avoid is glycerin and fragrance, can increase chance of infection Should not be used just before sex due to causing irritation Most are gels administered either in a tampon-shaped applicator or as a vaginal suppository. They are non-hormonal.   Types of Moisturizers(internal use)  Vitamin E vaginal suppositories- Whole foods, Amazon Moist Again Coconut oil- can break down condoms, any grocery store (prefer organic) Julva- (Do no use if taking  Tamoxifen) amazon Yes moisturizer- amazon NeuEve Silk , NeuEve Silver for menopausal or over 65 (if have severe vaginal atrophy or cancer treatments use NeuEve Silk for  1 month than move to [employer])- [employer], [place]wick.com Olive and Bee intimate cream- www.oliveandbee.com.au Mae vaginal moisturizer- Amazon Aloe Good Clean Love Hyaluronic acid Hyalofemme Reveree hyaluronic acid inserts   Creams to use externally on the Vulva area Marathon Oil (good for for cancer patients that had radiation to the area)- guam or [employer].https://garcia-[email or web address]/ Vulva Balm/ V-magic cream by medicine mama- amazon Julva-amazon Vital V Wild Yam salve ( help moisturize and help with thinning vulvar area, does have Beeswax MoodMaid Botanical Pro-Meno Wild Yam Cream- Amazon Desert Harvest Gele Cleo by Sherrlyn labial moisturizer (Amazon),  Coconut or olive oil aloe Good Clean Love Enchanted Rose by intimate rose  Things to avoid in the vaginal area Do not use things to irritate the vulvar area No lotions just specialized creams for the vulva area- Neogyn, V-magic,  No soaps; can use Aveeno or Calendula cleanser, unscented Dove if needed. Must be gentle No deodorants No douches Good to  sleep without underwear to let the vaginal area to air out No scrubbing: spread the lips to let warm water rinse over labias and pat dry   About Pelvic Support Problems Pelvic Support Problems Explained Ligaments, muscles, and connective tissue normally hold your bladder, uterus, and other organs in their proper places in your pelvis. When these tissues become weak, a problem with pelvic support may result. Weak support can cause one or more of the pelvic organs to drop down into the vagina. An organ may even drop so far that is partially exposed outside the body.  Pelvic support problems are named by the change in the organ. The main types of pelvic support problems are:  Cystocele: When the bladder drops down into your vagina.  Enterocele: When your small intestine drops between your vagina and rectum.  Rectocele: When your rectum bulges into the vaginal wall.  Uterine prolapse: When your uterus drops into your vagina.  Vaginal prolapse: When the top part of the vagina begins to droop. This sometimes happens after a hysterectomy (removal of the uterus).  Causes Pelvic support problems can be caused by many conditions. They may begin after you give birth, especially if you had a large baby. During childbirth, the muscles and skin of the birth canal (vagina) are stretched and sometimes torn. They heal over time but are not always exactly the same. A long pushing stage of labor may also weaken these tissues as well as very rapid births as the tissues do not have time to stretch so they tear.  Also, after menopause, there are changes in the vaginal walls resulting from a decrease in estrogen. Estrogen helps to  keep the tissues toned. Low levels of estrogen weaken the vaginal walls and may cause the bladder to shift from its normal position. As women get older, the loss of muscle tone and the relaxation of muscles may cause the uterus or other organs to drop.  Over time, conditions like chronic coughing,  chronic constipation, doing a lot of heavy lifting, straining to pass stool, and obesity, can also weaken the pelvic support muscles.  Diagnosing Pelvic Support Problems Your health care provider will ask about your symptoms and do a pelvic examination. Your provider may also do a rectal exam during your pelvic exam. Your provider may ask you to: 1. Bear down and push (like you are having a bowel movement) so he or she can see if your bladder or other part of your body protrudes into the vagina. 2. Contract the muscles of your pelvis to check the strength of your pelvic muscles.  3. Do several types of urine, nerve and muscle tests of the pelvis and around the bladder to see what type of treatment is best for you.   Symptoms Symptoms of pelvic support problems depend on the organ involved, but may include:  urine leakage  stain or fecal loss after a bowel movement trouble having bowel movements  ache in the lower abdomen, groin, or lower back  bladder infection  a feeling of heaviness, pulling, or fullness in the pelvis, or a feeling that something is falling out of the vagina  an organ protruding from your vaginal opening  feeling the need to support the organs or perineal area to empty bladder or bowels painful sexual intercourse.  Many women feel pelvic pressure or trouble holding their urine immediately after childbirth. For some, these symptoms go away permanently, in others they return as they get older.  Treatment Options A prolapsed organ cannot repair itself. Contact your health care provider as soon as you notice symptoms of a problem. Treatment depends on what the specific problem is and how far advanced it is.  The symptoms caused by some pelvic support problems may simply be treated with changes in diet, medicine to soften the stool, weight loss, or avoiding strenuous activities. You may also do pelvic floor exercises to help strengthen your pelvic muscles.  Some cases of prolapse  may require a special support device made from plastic or rubber called a pessary that fits into the vagina to support the uterus, vagina, or bladder. A pessary can also help women who leak urine when coughing, straining, or exercising. In mild cases, a tampon or vaginal diaphragm may be used instead of a pessary.  Talk to your doctor or health care provider about these options. In serious cases, surgery may be needed to put the organs back into their proper place. The uterus may be removed because of the pressure it puts on the bladder.  Your doctor will know what surgery will be best for you. How can I prevent pelvic support problems?  You can help prevent pelvic support problems by:  maintaining a healthy lifestyle  continuing to do pelvic floor exercises after you deliver a baby  maintaining a healthy weight  avoiding a lot of heavy lifting and lifting with your legs (not from your waist)  treating constipation and avoid getting      How To Poop Better:  What are Good Poops? There is no one exact normal, but they should be REGULAR.  This varies from person to person and ranges from up to 3x/day  or as little as 3-4/week.  This should stay consistent for you.  They should be formed and ideally one solid mass that doesn't fall apart or dissolve in the water and is brown in color.  Lifestyle Tips:  Fiber: Eat 25-31 grams per day Do not hold it.  If you need to go, GO! Try to go every day around the same time Walk and move more Probiotics for more healthy gut bacteria Water and fluids: half of your healthy body weight in ounces  Toileting Tips:  Posture: knees above hips, back flat, look straight ahead, RELAX Relax all the muscles from your face down to your toes Breathe: slow deep breaths into your belly and pelvic floor is RELAXED Blow: Tighten belly and blow like blowing up a balloon, make "SH" sound, make a vowel sound with a deep voice Do NOT sit more than 10 minutes After you are  finished, tighten the muscles to reset pelvic floor back to normal      About Abdominal Massage  Abdominal massage, also called external colon massage, is a self-treatment circular massage technique that can reduce and eliminate gas and ease constipation. The colon naturally contracts in waves in a clockwise direction starting from inside the right hip, moving up toward the ribs, across the belly, and down inside the left hip.  When you perform circular abdominal massage, you help stimulate your colon's normal wave pattern of movement called peristalsis.  It is most beneficial when done after eating.  Positioning You can practice abdominal massage with oil while lying down, or in the shower with soap.  Some people find that it is just as effective to do the massage through clothing while sitting or standing.  How to Massage Start by placing your finger tips or knuckles on your right side, just inside your hip bone.  Make small circular movements while you move upward toward your rib cage.   Once you reach the bottom right side of your rib cage, take your circular movements across to the left side of the bottom of your rib cage.  Next, move downward until you reach the inside of your left hip bone.  This is the path your feces travel in your colon. Continue to perform your abdominal massage in this pattern for 10 minutes each day.     You can apply as much pressure as is comfortable in your massage.  Start gently and build pressure as you continue to practice.  Notice any areas of pain as you massage; areas of slight pain may be relieved as you massage, but if you have areas of significant or intense pain, consult with your healthcare provider.  Other Considerations General physical activity including bending and stretching can have a beneficial massage-like effect on the colon.  Deep breathing can also stimulate the colon because breathing deeply activates the same nervous system that  supplies the colon.   Abdominal massage should always be used in combination with a bowel-conscious diet that is high in the proper type of fiber for you, fluids (primarily water), and a regular exercise program.  .toilet

## 2023-11-24 ENCOUNTER — Ambulatory Visit (HOSPITAL_COMMUNITY)
Admission: EM | Admit: 2023-11-24 | Discharge: 2023-11-24 | Disposition: A | Discharge status: Home / Self Care | Attending: Family Medicine | Admitting: Family Medicine

## 2023-11-24 ENCOUNTER — Encounter (HOSPITAL_COMMUNITY): Payer: Self-pay | Admitting: *Deleted

## 2023-11-24 DIAGNOSIS — M5416 Radiculopathy, lumbar region: Secondary | ICD-10-CM

## 2023-11-24 DIAGNOSIS — R21 Rash and other nonspecific skin eruption: Secondary | ICD-10-CM | POA: Diagnosis not present

## 2023-11-24 MED ORDER — KETOROLAC TROMETHAMINE 30 MG/ML IJ SOLN
30.0000 mg | Freq: Once | INTRAMUSCULAR | Status: AC
  Administered 2023-11-24 (×2): 30 mg via INTRAMUSCULAR

## 2023-11-24 MED ORDER — KETOROLAC TROMETHAMINE 30 MG/ML IJ SOLN
INTRAMUSCULAR | Status: AC
  Filled 2023-11-24: qty 1

## 2023-11-24 MED ORDER — TIZANIDINE HCL 4 MG PO TABS: 4.0000 mg | ORAL_TABLET | Freq: Three times a day (TID) | ORAL | 0 refills | Status: DC | PRN

## 2023-11-24 MED ORDER — METHYLPREDNISOLONE SODIUM SUCC 125 MG IJ SOLR
INTRAMUSCULAR | Status: AC
  Filled 2023-11-24: qty 2

## 2023-11-24 MED ORDER — PREDNISONE 20 MG PO TABS
40.0000 mg | ORAL_TABLET | Freq: Every day | ORAL | 0 refills | Status: AC
Start: 2023-11-24 — End: 2023-11-29

## 2023-11-24 MED ORDER — METHYLPREDNISOLONE SODIUM SUCC 125 MG IJ SOLR
80.0000 mg | Freq: Once | INTRAMUSCULAR | Status: AC
  Administered 2023-11-24 (×2): 80 mg via INTRAMUSCULAR

## 2023-11-24 NOTE — Discharge Instructions (Addendum)
 You were seen today for back pain, leg pain and rash.  While here today you were given a shot of a pain medication and steroid.  These usually take affect in the next 30 mins or so. I have sent out a script for a muscle relaxer and oral steroid.  You may start the muscle relaxer today, but take when home and not driving as this may make you tired/drowsy.  Please start the oral prednisone  TOMORROW as you were given a shot of a steroid today  You may take motrin /aleve  for pain, and use a heating pad or ice pack to the back (which ever feels better).  The shot of steroid will help with the facial rash.  You may continue zyrtec  for this, and use over the counter cortisone as well.  Please return if you are not improving.

## 2023-11-24 NOTE — ED Triage Notes (Addendum)
 States went for first pelvic floor exercise therapy 6 days ago; that evening started with left low back pain. C/O pain radiating into left buttock and down LLE with parasthesia. Pt ambulates with limp. Has tried IBU, multiple OTC supplements, coffee supplement. States took the coffee supplement 2 days ago, and that night started with pruritic bumpy rash to face. Has washed face in baking soda and water, and has tried hydrocortisone cream.

## 2023-11-24 NOTE — ED Provider Notes (Signed)
 [facility]    CSN: [id number] Arrival date & time: 11/24/23  0805      History   Chief Complaint Chief Complaint  Patient presents with   Back Pain   Rash    HPI Natalie Vazquez is a 63 y.o. female.    Back Pain Associated symptoms: numbness   Rash  Patient is here for left low back pain.  It goes into the left buttock, down the leg and into the toes.  This started about 1 week ago after pelvic floor PT.  She is having throbbing pain, numbness and tingling.  No difficulty with bowel/bladder.  Pain is waking her at night.  She has been taking motrin , heating pad, diclofenac  gel, otc supplements without help  She also has a rash on her face.  She thinks this was due to a coffee supplement.  She took zyrtec  with help, and cortisone 10.  It is itchy.        Past Medical History:  Diagnosis Date   Allergic rhinitis due to other allergen    Allergy    seasonal   Anemia    Arthritis    Asthma    mild, intermittent   Chicken pox    Cholelithiasis    CTS (carpal tunnel syndrome)    Diabetes mellitus without complication (HCC)    Frequent urination    GERD (gastroesophageal reflux disease)    Headache(784.0)    Migraine    PONV (postoperative nausea and vomiting)    difficulty waking up   Problems related to high-risk sexual behavior    Unprotected sex   Sleep apnea    No Cpap   Symptomatic menopausal or female climacteric states     Patient Active Problem List   Diagnosis Date Noted   Generalized headaches 01/22/2021   Food allergy 01/10/2020   Pollen-food allergy 01/10/2020   Mild intermittent asthma without complication 01/10/2020   Herpes simplex type 1 infection 06/01/2019   Morbid obesity due to excess calories (HCC) 05/02/2019   Iron deficiency 05/02/2019   Obesity (BMI 30-39.9) 08/19/2018   Pre-diabetes 02/18/2018   OSA (obstructive sleep apnea) 05/28/2017   Noise-induced hearing loss of both ears 01/17/2017   Infection due to  trichomonas 11/25/2016   Tinnitus aurium, 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   Bilateral temporomandibular joint pain 04/23/2016   Eustachian tube dysfunction 04/23/2016   Chronic allergic rhinitis 04/23/2016   Bilateral tinnitus 04/23/2016   Family history of breast cancer 12/25/2015   History of hysterectomy 12/23/2015   Uterine prolapse 12/23/2015   Migraines 09/28/2013   Visit for preventive health examination 09/10/2013   GERD (gastroesophageal reflux disease) 03/19/2013   Seasonal and perennial allergic rhinoconjunctivitis 03/19/2013   Neck pain, chronic 03/19/2013   IBS (irritable bowel syndrome) 03/19/2013    Past Surgical History:  Procedure Laterality Date   ANTERIOR AND POSTERIOR REPAIR WITH SACROSPINOUS FIXATION N/A 06/07/2023   Procedure: ANTERIOR REPAIR WITH SACROSPINOUS FIXATION;  Surgeon: Marilynne Rosaline SAILOR, MD;  Location: [facility];  Service: Gynecology;  Laterality: N/A;  Total time requested is 1.5 hrs [facility]   CARPAL TUNNEL RELEASE Right 04/13/2013   CHOLECYSTECTOMY  05/19/2011   Procedure: LAPAROSCOPIC CHOLECYSTECTOMY;  Surgeon: Dann FORBES Hummer, MD;  Location: [facility] OR;  Service: General;  Laterality: N/A;   CYSTOSCOPY N/A 06/07/2023   Procedure: CYSTOSCOPY;  Surgeon: Marilynne Rosaline SAILOR, MD;  Location: [facility] OR;  Service: Gynecology;  Laterality: N/A;   LAPAROSCOPIC HYSTERECTOMY  04/13/2006   partial, ovaries remain  LASER ABLATION CONDYLOMA CERVICAL / VULVAR     PERCUTANEOUS PINNING  01/22/2012   Procedure: PERCUTANEOUS PINNING EXTREMITY;  Surgeon: Oneil JAYSON Herald, MD;  Location: [facility] OR;  Service: Orthopedics;  Laterality: Right;  Closed Reduction and Pinning right 5th Metacarpal Fracture   TUBAL LIGATION     WISDOM TOOTH EXTRACTION      OB History     Gravida  5   Para  4   Term  4   Preterm      AB  1   Living  5      SAB  1   IAB      Ectopic      Multiple      Live Births  5            Home Medications    Prior to Admission medications    Medication Sig Start Date End Date Taking? Authorizing Provider  BEE POLLEN PO Take 1 tablet by mouth daily as needed (energy).   Yes [provider]  Berberine Chloride (BERBERINE HCI PO) Take 500 mg by mouth daily.   Yes [provider]  BIOTIN PO Take 1 tablet by mouth once a week.   Yes [provider]  cetirizine  (ZYRTEC  ALLERGY) 10 MG tablet Take 1 tablet (10 mg total) by mouth daily. 10/18/23  Yes Allwardt, Alyssa M, PA-C  CINNAMON PO Take by mouth.   Yes [provider]  cyclobenzaprine  (FLEXERIL ) 10 MG tablet 1 tablet PO QHS PRN 09/27/18  Yes Gladis Elsie JAYSON, PA-C  Homeopathic Products (OSCILLOCOCCINUM) PLLT Take 1 Tube by mouth daily.   Yes [provider]  ibuprofen  (ADVIL ) 600 MG tablet Take 1 tablet (600 mg total) by mouth every 6 (six) hours as needed. 05/21/23  Yes Zuleta, Kaitlin G, NP  NON FORMULARY Melia supreme 500 mg  one time a day   Yes [provider]  NON FORMULARY Neuro - mag L-T 144 mg one a day   Yes [provider]  OLIVE LEAF PO Take 700 mg by mouth daily.   Yes [provider]  zonisamide  (ZONEGRAN ) 25 MG capsule Take 25 mg by mouth daily as needed (headaches). 03/06/16  Yes [provider]  acetaminophen  (TYLENOL ) 500 MG tablet Take 1 tablet (500 mg total) by mouth every 6 (six) hours as needed (pain). 05/21/23   Zuleta, Kaitlin G, NP  EPINEPHrine  0.3 mg/0.3 mL IJ SOAJ injection Inject 0.3 mg into the muscle as needed for anaphylaxis. 05/17/23   Allwardt, Alyssa M, PA-C  estradiol  (ESTRACE ) 0.1 MG/GM vaginal cream Place 0.5g in the vagina two times per week 07/01/23   Marilynne Rosaline SAILOR, MD  levalbuterol  (XOPENEX  HFA) 45 MCG/ACT inhaler INHALE 1 TO 2 PUFFS BY MOUTH EVERY 6 HOURS AS NEEDED FOR WHEEZING 05/17/23   Allwardt, Alyssa M, PA-C  Misc Natural Products (SUPERIOR JOINT SUPPORT PO) Take 1 capsule by mouth daily.    [provider]  NON FORMULARY Take 4 tablets by mouth as needed  (constipation). Swiss Kriss Herbal Laxative Tab    [provider]  Olopatadine  HCl 0.2 % SOLN Apply 1 drop to eye daily as needed (itchy/watery eyes). 01/10/20   Luke Orlan HERO, DO  Omega-3 Fatty Acids (OMEGA 3 PO) Take 1 capsule by mouth daily.    [provider]  ondansetron  (ZOFRAN ) 4 MG tablet Take 1 tablet (4 mg total) by mouth every 8 (eight) hours as needed for nausea or vomiting. Patient not taking:  Reported on 07/27/2023 05/21/23   Zuleta, Kaitlin G, NP  ondansetron  (ZOFRAN -ODT) 8 MG disintegrating tablet Take 1 tablet (8 mg total) by mouth every 8 (eight) hours as needed for nausea or vomiting. Patient not taking: Reported on 07/27/2023 06/19/23   Dallie Vera GAILS, MD  oxyCODONE  (OXY IR/ROXICODONE ) 5 MG immediate release tablet Take 1 tablet (5 mg total) by mouth every 4 (four) hours as needed for severe pain (pain score 7-10). Patient not taking: Reported on 06/29/2023 05/21/23   Zuleta, Kaitlin G, NP  SUMAtriptan  (IMITREX ) 100 MG tablet Take one tablet by mouth onset of headache, May repeat in 2 hours if headache persists or recurs. 06/20/19   Gladis Elsie BROCKS, PA-C  vitamin E 180 MG (400 UNITS) capsule Take 1 capsule by mouth daily.    [provider]    Family History Family History  Problem Relation Age of Onset   Diabetes Mother        Deceased   Hypertension Mother    Anemia Mother    Hyperlipidemia Mother    Arthritis/Rheumatoid Mother    Migraines Mother    CVA Mother    Hypertension Father        Deceased   Stroke Father    Diabetes Father        Legs Amputated   Heart disease Father    Arthritis Father    Cancer Father    Stomach cancer Sister    Breast cancer Sister    Uterine cancer Sister        or Ovarian   Asthma Sister        x2   Diabetes Sister    Hypertension Sister    Heart disease Sister    Sleep apnea Brother    COPD Brother    Sudden death Brother        SIDS   Hypertension Brother    Asthma Brother    Asthma Daughter         x2   Allergies Daughter    Atrial fibrillation Daughter    Anxiety disorder Daughter    Allergies Son    Heart attack Maternal Aunt    Migraines Maternal Aunt    Asthma Maternal Aunt    Diabetes Maternal Uncle    Asthma Maternal Uncle    Sickle cell trait Other        All Children   Bladder Cancer Neg Hx     Social History Social History   Tobacco Use   Smoking status: Never   Smokeless tobacco: Never  Vaping Use   Vaping status: Never Used  Substance Use Topics   Alcohol use: Yes    Comment: rarely    Drug use: No     Allergies   Iodine, Shellfish allergy, Other, and Latex   Review of Systems Review of Systems  Constitutional: Negative.   HENT: Negative.    Respiratory: Negative.    Cardiovascular: Negative.   Gastrointestinal: Negative.   Genitourinary: Negative.   Musculoskeletal:  Positive for back pain.  Skin:  Positive for rash.  Neurological:  Positive for numbness.     Physical Exam Triage Vital Signs ED Triage Vitals [11/24/23 0830]  Encounter Vitals Group     BP 130/85     Girls Systolic BP Percentile      Girls Diastolic BP Percentile      Boys Systolic BP Percentile      Boys Diastolic BP Percentile      Pulse Rate  68     Resp 16     Temp 98 F (36.7 C)     Temp Source Oral     SpO2 98 %     Weight      Height      Head Circumference      Peak Flow      Pain Score 7     Pain Loc      Pain Education      Exclude from Growth Chart    No data found.  Updated Vital Signs BP 130/85   Pulse 68   Temp 98 F (36.7 C) (Oral)   Resp 16   SpO2 98%   Visual Acuity Right Eye Distance:   Left Eye Distance:   Bilateral Distance:    Right Eye Near:   Left Eye Near:    Bilateral Near:     Physical Exam Constitutional:      General: She is not in acute distress.    Appearance: She is normal weight.     Comments: Appears very uncomfortable sitting on the table  Cardiovascular:     Rate and Rhythm: Normal rate and regular  rhythm.  Pulmonary:     Effort: Pulmonary effort is normal.     Breath sounds: Normal breath sounds.  Musculoskeletal:     Comments: No spinous tenderness;  +TTP to the left low back/paraspinal/SI joint area;  decreased rom to the left LE due to pain.    Skin:    Comments: Red, raised rash to the right side of the face, at the temple, forehead, and lateral right cheek  Neurological:     General: No focal deficit present.     Mental Status: She is alert.  Psychiatric:        Mood and Affect: Mood normal.      UC Treatments / Results  Labs (all labs ordered are listed, but only abnormal results are displayed) Labs Reviewed - No data to display  EKG   Radiology No results found.  Procedures Procedures (including critical care time)  Medications Ordered in UC Medications  ketorolac  (TORADOL ) 30 MG/ML injection 30 mg (has no administration in time range)  methylPREDNISolone  sodium succinate (SOLU-MEDROL ) 125 mg/2 mL injection 80 mg (has no administration in time range)    Initial Impression / Assessment and Plan / UC Course  I have reviewed the triage vital signs and the nursing notes.  Pertinent labs & imaging results that were available during my care of the patient were reviewed by me and considered in my medical decision making (see chart for details).   Final Clinical Impressions(s) / UC Diagnoses   Final diagnoses:  Lumbar back pain with radiculopathy affecting left lower extremity  Rash and nonspecific skin eruption     Discharge Instructions      You were seen today for back pain, leg pain and rash.  While here today you were given a shot of a pain medication and steroid.  These usually take affect in the next 30 mins or so. I have sent out a script for a muscle relaxer and oral steroid.  You may start the muscle relaxer today, but take when home and not driving as this may make you tired/drowsy.  Please start the oral prednisone  TOMORROW as you were given a  shot of a steroid today  You may take motrin /aleve  for pain, and use a heating pad or ice pack to the back (which ever feels better).  The  shot of steroid will help with the facial rash.  You may continue zyrtec  for this, and use over the counter cortisone as well.  Please return if you are not improving.     ED Prescriptions     Medication Sig Dispense Auth. Provider   tiZANidine  (ZANAFLEX ) 4 MG tablet Take 1 tablet (4 mg total) by mouth every 8 (eight) hours as needed. 30 tablet Jebadiah Imperato, MD   predniSONE  (DELTASONE ) 20 MG tablet Take 2 tablets (40 mg total) by mouth daily for 5 days. 10 tablet Darral Longs, MD      PDMP not reviewed this encounter.   Darral Longs, MD 11/24/23 [phone]

## 2023-11-25 ENCOUNTER — Encounter: Admitting: Physical Therapy

## 2023-11-29 ENCOUNTER — Ambulatory Visit: Admitting: Physician Assistant

## 2023-11-29 VITALS — BP 124/84 | HR 64 | Temp 97.3°F | Ht 65.75 in | Wt 206.6 lb

## 2023-11-29 DIAGNOSIS — M5432 Sciatica, left side: Secondary | ICD-10-CM

## 2023-11-29 MED ORDER — KETOROLAC TROMETHAMINE 60 MG/2ML IM SOLN
60.0000 mg | Freq: Once | INTRAMUSCULAR | Status: AC
  Administered 2023-11-29: 60 mg via INTRAMUSCULAR

## 2023-11-29 NOTE — Patient Instructions (Addendum)
 1:45 pm on 11/29/23 with Dr. Claudene  Address: [address], [place], [place] [address] Phone: [phone]

## 2023-11-29 NOTE — Progress Notes (Signed)
 Patient ID: Natalie Vazquez, female    DOB: 04-04-1961, 63 y.o.   MRN: [id number]   Assessment & Plan:  Sciatic pain, left -     Ketorolac  Tromethamine       Assessment and Plan Assessment & Plan Left-sided sciatica Left-sided sciatica persisting for over a week with severe pain, rated 10/10 at worst and 5/10 at best, radiating from the buttock down the leg, consistent with sciatic nerve distribution. Symptoms began after pelvic floor physical therapy, suggesting possible nerve irritation or displacement. Previous treatment with prednisone  and urgent care interventions provided minimal relief. Tingling is present. No bowel or bladder incontinence reported. Differential includes nerve irritation or mechanical displacement. - Administer Toradol  60 mg injection. - Provide ER precautions for urinary or bowel incontinence, leg giving out, or saddle anesthesia. - Schedule follow-up with Dr. Claudene at Sports Medicine for possible adjustment. - Follow up with Family Medicine as needed.      Return if symptoms worsen or fail to improve.    Subjective:    Chief Complaint  Patient presents with   Sciatic Pain    Pt in office c/o sciatica and nerve pain; pt went in for pelvic floor therapy and later that day started having issues with sciatic pain and has got worse over past week. Patient states pain is constant was seen in UC and received Toradol  injection and depo medrol  injections.    HPI Discussed the use of AI scribe software for clinical note transcription with the patient, who gave verbal consent to proceed.  History of Present Illness Natalie Vazquez is a 63 year old female who presents with left-sided sciatica pain.  She has been experiencing severe pain in her left leg for over a week, describing it as a 'ten' at its peak and a 'five' when it subsides. The pain radiates from the lower back, around the buttock, and down to her toes.  The pain began after a session of  pelvic floor physical therapy, where she felt a 'little whoosh' during the session, followed by increasing pain later that evening. She has not experienced similar symptoms before and describes the sensation as something being out of place.  She has tried various treatments, including a course of prednisone , which she completed yesterday, and received some relief from urgent care shots. However, the pain persists and is always present. Coffee provided some relief, but an energy supplement containing caffeine and vitamins caused a breakout on her face.  She experiences tingling in her foot and can feel sensation when touched, but the leg pain is significant enough that her grandchildren have noticed her discomfort. She has been unable to stretch or perform activities when the pain is at its worst, but attempts to do so when the pain is at a 'five'. She declined a work note despite the pain, indicating a need to continue working.  No history of headaches or previous similar episodes of pain.     Past Medical History:  Diagnosis Date   Allergic rhinitis due to other allergen    Allergy    seasonal   Anemia    Arthritis    Asthma    mild, intermittent   Chicken pox    Cholelithiasis    CTS (carpal tunnel syndrome)    Diabetes mellitus without complication (HCC)    Frequent urination    GERD (gastroesophageal reflux disease)    Headache(784.0)    Migraine    PONV (postoperative nausea and vomiting)  difficulty waking up   Problems related to high-risk sexual behavior    Unprotected sex   Sleep apnea    No Cpap   Symptomatic menopausal or female climacteric states     Past Surgical History:  Procedure Laterality Date   ANTERIOR AND POSTERIOR REPAIR WITH SACROSPINOUS FIXATION N/A 06/07/2023   Procedure: ANTERIOR REPAIR WITH SACROSPINOUS FIXATION;  Surgeon: Marilynne Rosaline SAILOR, MD;  Location: [facility] OR;  Service: Gynecology;  Laterality: N/A;  Total time requested is 1.5 hrs [facility]    CARPAL TUNNEL RELEASE Right 04/13/2013   CHOLECYSTECTOMY  05/19/2011   Procedure: LAPAROSCOPIC CHOLECYSTECTOMY;  Surgeon: Dann FORBES Hummer, MD;  Location: [facility] OR;  Service: General;  Laterality: N/A;   CYSTOSCOPY N/A 06/07/2023   Procedure: CYSTOSCOPY;  Surgeon: Marilynne Rosaline SAILOR, MD;  Location: [facility] OR;  Service: Gynecology;  Laterality: N/A;   LAPAROSCOPIC HYSTERECTOMY  04/13/2006   partial, ovaries remain   LASER ABLATION CONDYLOMA CERVICAL / VULVAR     PERCUTANEOUS PINNING  01/22/2012   Procedure: PERCUTANEOUS PINNING EXTREMITY;  Surgeon: Oneil JAYSON Herald, MD;  Location: [facility] OR;  Service: Orthopedics;  Laterality: Right;  Closed Reduction and Pinning right 5th Metacarpal Fracture   TUBAL LIGATION     WISDOM TOOTH EXTRACTION      Family History  Problem Relation Age of Onset   Diabetes Mother        Deceased   Hypertension Mother    Anemia Mother    Hyperlipidemia Mother    Arthritis/Rheumatoid Mother    Migraines Mother    CVA Mother    Hypertension Father        Deceased   Stroke Father    Diabetes Father        Legs Amputated   Heart disease Father    Arthritis Father    Cancer Father    Stomach cancer Sister    Breast cancer Sister    Uterine cancer Sister        or Ovarian   Asthma Sister        x2   Diabetes Sister    Hypertension Sister    Heart disease Sister    Sleep apnea Brother    COPD Brother    Sudden death Brother        SIDS   Hypertension Brother    Asthma Brother    Asthma Daughter        x2   Allergies Daughter    Atrial fibrillation Daughter    Anxiety disorder Daughter    Allergies Son    Heart attack Maternal Aunt    Migraines Maternal Aunt    Asthma Maternal Aunt    Diabetes Maternal Uncle    Asthma Maternal Uncle    Sickle cell trait Other        All Children   Bladder Cancer Neg Hx     Social History   Tobacco Use   Smoking status: Never   Smokeless tobacco: Never  Vaping Use   Vaping status: Never Used  Substance Use Topics    Alcohol use: Yes    Comment: rarely    Drug use: No     Allergies  Allergen Reactions   Iodine Anaphylaxis   Shellfish Allergy Anaphylaxis   Fish Oil Other (See Comments)   Other     Grapes - rash    Latex Rash    Review of Systems NEGATIVE UNLESS OTHERWISE INDICATED IN HPI      Objective:  BP 124/84 (BP Location: Left Arm, Patient Position: Sitting, Cuff Size: Normal)   Pulse 64   Temp (!) 97.3 F (36.3 C) (Temporal)   Ht 5' 5.75 (1.67 m)   Wt 206 lb 9.6 oz (93.7 kg)   SpO2 98%   BMI 33.60 kg/m   Wt Readings from Last 3 Encounters:  11/29/23 206 lb 9.6 oz (93.7 kg)  06/02/23 212 lb 4.9 oz (96.3 kg)  05/21/23 212 lb 3.2 oz (96.3 kg)    BP Readings from Last 3 Encounters:  11/29/23 124/84  11/24/23 130/85  07/27/23 (!) 138/94     Physical Exam Vitals and nursing note reviewed.  Constitutional:      General: She is in acute distress (uncomfortable, massaging her leg).     Appearance: Normal appearance. She is not ill-appearing.  HENT:     Head: Normocephalic and atraumatic.  Cardiovascular:     Rate and Rhythm: Normal rate and regular rhythm.     Pulses: Normal pulses.     Heart sounds: Normal heart sounds.  Pulmonary:     Effort: Pulmonary effort is normal.     Breath sounds: Normal breath sounds.  Musculoskeletal:     Lumbar back: Positive left straight leg raise test.     Comments: Pedal pulses normal Sensation intact, complains of tingling feeling Can push / pull against me   Skin:    General: Skin is warm and dry.  Neurological:     General: No focal deficit present.     Mental Status: She is alert.  Psychiatric:        Mood and Affect: Mood normal.             Iverson Sees M Jace Fermin, PA-C

## 2023-11-30 ENCOUNTER — Ambulatory Visit (INDEPENDENT_AMBULATORY_CARE_PROVIDER_SITE_OTHER): Admitting: Family Medicine

## 2023-11-30 ENCOUNTER — Ambulatory Visit (INDEPENDENT_AMBULATORY_CARE_PROVIDER_SITE_OTHER)

## 2023-11-30 ENCOUNTER — Encounter: Payer: Self-pay | Admitting: Family Medicine

## 2023-11-30 VITALS — BP 112/70 | HR 79 | Ht 65.5 in | Wt 206.0 lb

## 2023-11-30 DIAGNOSIS — N814 Uterovaginal prolapse, unspecified: Secondary | ICD-10-CM | POA: Diagnosis not present

## 2023-11-30 DIAGNOSIS — R102 Pelvic and perineal pain: Secondary | ICD-10-CM | POA: Diagnosis not present

## 2023-11-30 DIAGNOSIS — M5416 Radiculopathy, lumbar region: Secondary | ICD-10-CM

## 2023-11-30 MED ORDER — PREDNISONE 20 MG PO TABS: 40.0000 mg | ORAL_TABLET | Freq: Every day | ORAL | 0 refills | Status: DC

## 2023-11-30 NOTE — Assessment & Plan Note (Addendum)
 History of uterine prolapse and unfortunately recently did have a revision per patient.  Still trying to find the records of this.  It looks that it would appear to be in 1 possibly February of this year.  Patient has not had a true postsurgical follow-up she states.  Has started physical therapy and feels like this is giving her the radicular symptoms.  Due to the severity of it I do feel advanced imaging is warranted at this time.  Depending on findings we can discuss medical management thereafter.  Patient does have worsening pain, weakness, urinary or bladder incontinence to seek medical attention immediately.

## 2023-11-30 NOTE — Progress Notes (Signed)
 [facility] [address] [place] [address] Phone: [phone] Subjective:   LILLETTE Berwyn Posey, am serving as a scribe for Dr. Arthea Claudene.  I'm seeing this patient by the request  of:  Allwardt, Mardy HERO, PA-C  CC: Left low back pain  YEP:Dlagzrupcz  Dariella R Baldridge is a 63 y.o. female coming in with complaint of low back pain.  Was seen in urgent care 6 days ago with severe pain.  Seen by primary care yesterday.  Sciatic nerve distribution.  Given Toradol  yesterday. Pain starts in L hip and radiates into glute down quad and into her foot. Has pelvic floor therapy and pain started after last session. Pain is constant and is not relieved by any position. Heat helping somewhat.     Past Medical History:  Diagnosis Date   Allergic rhinitis due to other allergen    Allergy    seasonal   Anemia    Arthritis    Asthma    mild, intermittent   Chicken pox    Cholelithiasis    CTS (carpal tunnel syndrome)    Diabetes mellitus without complication (HCC)    Frequent urination    GERD (gastroesophageal reflux disease)    Headache(784.0)    Migraine    PONV (postoperative nausea and vomiting)    difficulty waking up   Problems related to high-risk sexual behavior    Unprotected sex   Sleep apnea    No Cpap   Symptomatic menopausal or female climacteric states    Past Surgical History:  Procedure Laterality Date   ANTERIOR AND POSTERIOR REPAIR WITH SACROSPINOUS FIXATION N/A 06/07/2023   Procedure: ANTERIOR REPAIR WITH SACROSPINOUS FIXATION;  Surgeon: Marilynne Rosaline SAILOR, MD;  Location: [facility] OR;  Service: Gynecology;  Laterality: N/A;  Total time requested is 1.5 hrs [facility]   CARPAL TUNNEL RELEASE Right 04/13/2013   CHOLECYSTECTOMY  05/19/2011   Procedure: LAPAROSCOPIC CHOLECYSTECTOMY;  Surgeon: Dann FORBES Hummer, MD;  Location: [facility] OR;  Service: General;  Laterality: N/A;   CYSTOSCOPY N/A 06/07/2023   Procedure: CYSTOSCOPY;  Surgeon: Marilynne Rosaline SAILOR, MD;  Location: [facility] OR;  Service: Gynecology;  Laterality: N/A;   LAPAROSCOPIC HYSTERECTOMY  04/13/2006   partial, ovaries remain   LASER ABLATION CONDYLOMA CERVICAL / VULVAR     PERCUTANEOUS PINNING  01/22/2012   Procedure: PERCUTANEOUS PINNING EXTREMITY;  Surgeon: Oneil JAYSON Herald, MD;  Location: [facility] OR;  Service: Orthopedics;  Laterality: Right;  Closed Reduction and Pinning right 5th Metacarpal Fracture   TUBAL LIGATION     WISDOM TOOTH EXTRACTION     Social History   Socioeconomic History   Marital status: Single    Spouse name: Not on file   Number of children: Not on file   Years of education: Not on file   Highest education level: Associate degree: academic program  Occupational History   Not on file  Tobacco Use   Smoking status: Never   Smokeless tobacco: Never  Vaping Use   Vaping status: Never Used  Substance and Sexual Activity   Alcohol use: Yes    Comment: rarely    Drug use: No   Sexual activity: Not Currently    Birth control/protection: Surgical  Other Topics Concern   Not on file  Social History Narrative   Right handed    Caffeine occasionally   Two story home   Yes post office ,    Social Drivers of Health   Financial Resource Strain:  Low Risk  (11/29/2023)   Overall Financial Resource Strain (CARDIA)    Difficulty of Paying Living Expenses: Not very hard  Food Insecurity: Food Insecurity Present (11/29/2023)   Hunger Vital Sign    Worried About Running Out of Food in the Last Year: Sometimes true    Ran Out of Food in the Last Year: Never true  Transportation Needs: No Transportation Needs (11/29/2023)   PRAPARE - Administrator, Civil Service (Medical): No    Lack of Transportation (Non-Medical): No  Physical Activity: Insufficiently Active (11/29/2023)   Exercise Vital Sign    Days of Exercise per Week: 5 days    Minutes of Exercise per Session: 10 min  Stress: No Stress Concern Present (11/29/2023)   [employer] of  Occupational Health - Occupational Stress Questionnaire    Feeling of Stress: Only a little  Social Connections: Moderately Isolated (11/29/2023)   Social Connection and Isolation Panel    Frequency of Communication with Friends and Family: More than three times a week    Frequency of Social Gatherings with Friends and Family: More than three times a week    Attends Religious Services: More than 4 times per year    Active Member of [employer] or Organizations: No    Attends Engineer, structural: Not on file    Marital Status: Divorced   Allergies  Allergen Reactions   Iodine Anaphylaxis   Shellfish Allergy Anaphylaxis   Fish Oil Other (See Comments)   Other     Grapes - rash    Latex Rash   Family History  Problem Relation Age of Onset   Diabetes Mother        Deceased   Hypertension Mother    Anemia Mother    Hyperlipidemia Mother    Arthritis/Rheumatoid Mother    Migraines Mother    CVA Mother    Hypertension Father        Deceased   Stroke Father    Diabetes Father        Legs Amputated   Heart disease Father    Arthritis Father    Cancer Father    Stomach cancer Sister    Breast cancer Sister    Uterine cancer Sister        or Ovarian   Asthma Sister        x2   Diabetes Sister    Hypertension Sister    Heart disease Sister    Sleep apnea Brother    COPD Brother    Sudden death Brother        SIDS   Hypertension Brother    Asthma Brother    Asthma Daughter        x2   Allergies Daughter    Atrial fibrillation Daughter    Anxiety disorder Daughter    Allergies Son    Heart attack Maternal Aunt    Migraines Maternal Aunt    Asthma Maternal Aunt    Diabetes Maternal Uncle    Asthma Maternal Uncle    Sickle cell trait Other        All Children   Bladder Cancer Neg Hx      Current Outpatient Medications (Cardiovascular):    EPINEPHrine  0.3 mg/0.3 mL IJ SOAJ injection, Inject 0.3 mg into the muscle as needed for anaphylaxis.  Current  Outpatient Medications (Respiratory):    cetirizine  (ZYRTEC  ALLERGY) 10 MG tablet, Take 1 tablet (10 mg total) by mouth daily.   levalbuterol  (XOPENEX   HFA) 45 MCG/ACT inhaler, INHALE 1 TO 2 PUFFS BY MOUTH EVERY 6 HOURS AS NEEDED FOR WHEEZING  Current Outpatient Medications (Analgesics):    acetaminophen  (TYLENOL ) 500 MG tablet, Take 1 tablet (500 mg total) by mouth every 6 (six) hours as needed (pain).   ibuprofen  (ADVIL ) 600 MG tablet, Take 1 tablet (600 mg total) by mouth every 6 (six) hours as needed.   SUMAtriptan  (IMITREX ) 100 MG tablet, Take one tablet by mouth onset of headache, May repeat in 2 hours if headache persists or recurs.   Current Outpatient Medications (Other):    BEE POLLEN PO, Take 1 tablet by mouth daily as needed (energy).   Berberine Chloride (BERBERINE HCI PO), Take 500 mg by mouth daily.   BIOTIN PO, Take 1 tablet by mouth once a week.   CINNAMON PO, Take by mouth.   Homeopathic Products (OSCILLOCOCCINUM) PLLT, Take 1 Tube by mouth daily.   NON FORMULARY, Take 4 tablets by mouth as needed (constipation). Swiss Kriss Herbal Laxative Tab   NON FORMULARY, Melia supreme 500 mg  one time a day   NON FORMULARY, Neuro - mag L-T 144 mg one a day   OLIVE LEAF PO, Take 700 mg by mouth daily. (Patient not taking: Reported on 11/29/2023)   tiZANidine  (ZANAFLEX ) 4 MG tablet, Take 1 tablet (4 mg total) by mouth every 8 (eight) hours as needed. (Patient not taking: Reported on 11/29/2023)   zonisamide  (ZONEGRAN ) 25 MG capsule, Take 25 mg by mouth daily as needed (headaches).   Reviewed prior external information including notes and imaging from  primary care provider As well as notes that were available from care everywhere and other healthcare systems.  Past medical history, social, surgical and family history all reviewed in electronic medical record.  No pertanent information unless stated regarding to the chief complaint.   Review of Systems:  No headache, visual changes,  nausea, vomiting, diarrhea, constipation, dizziness, abdominal pain, skin rash, fevers, chills, night sweats, weight loss, swollen lymph nodes, body aches, joint swelling, chest pain, shortness of breath, mood changes. POSITIVE muscle aches  Objective  There were no vitals taken for this visit.   General: No apparent distress alert and oriented x3 mood and affect normal, dressed appropriately.  HEENT: Pupils equal, extraocular movements intact  Respiratory: Patient's speak in full sentences and does not appear short of breath  Cardiovascular: No lower extremity edema, non tender, no erythema  Patient is incredibly uncomfortable.  Discussed with patient at great length.  We discussed that there could have been potentially some instability.  Patient was to start physical therapy.  Did have a anterior to posterior fixation noted.  Patient has difficulty moving her leg somewhat.  No radicular symptoms noted at all times.  Patient does have weakness noted with dorsi flexion of the foot fairly considerably but Achilles deep tendon reflexes is intact and symmetric.    Impression and Recommendations:    The above documentation has been reviewed and is accurate and complete Ally Knodel M Meygan Kyser, DO

## 2023-11-30 NOTE — Patient Instructions (Addendum)
 Xray today MRI [id number] Prednisone  40mg  for 5 days If worsening pain, seek medical attention Check blood sugar more often while on prednisone  We will be in touch

## 2023-12-02 ENCOUNTER — Encounter: Admitting: Physical Therapy

## 2023-12-04 ENCOUNTER — Ambulatory Visit
Admission: RE | Admit: 2023-12-04 | Discharge: 2023-12-04 | Disposition: A | Discharge status: Home / Self Care | Source: Ambulatory Visit | Attending: Family Medicine | Admitting: Family Medicine

## 2023-12-04 DIAGNOSIS — M25552 Pain in left hip: Secondary | ICD-10-CM | POA: Diagnosis not present

## 2023-12-04 DIAGNOSIS — R102 Pelvic and perineal pain: Secondary | ICD-10-CM

## 2023-12-04 DIAGNOSIS — M4726 Other spondylosis with radiculopathy, lumbar region: Secondary | ICD-10-CM | POA: Diagnosis not present

## 2023-12-04 DIAGNOSIS — M5116 Intervertebral disc disorders with radiculopathy, lumbar region: Secondary | ICD-10-CM | POA: Diagnosis not present

## 2023-12-04 DIAGNOSIS — M5416 Radiculopathy, lumbar region: Secondary | ICD-10-CM

## 2023-12-05 ENCOUNTER — Ambulatory Visit: Payer: Self-pay | Admitting: Family Medicine

## 2023-12-06 ENCOUNTER — Encounter: Payer: Self-pay | Admitting: Obstetrics and Gynecology

## 2023-12-06 ENCOUNTER — Encounter: Payer: Self-pay | Admitting: Family Medicine

## 2023-12-07 ENCOUNTER — Other Ambulatory Visit: Payer: Self-pay

## 2023-12-07 DIAGNOSIS — R102 Pelvic and perineal pain: Secondary | ICD-10-CM

## 2023-12-09 ENCOUNTER — Encounter: Payer: Self-pay | Admitting: Obstetrics and Gynecology

## 2023-12-09 ENCOUNTER — Ambulatory Visit: Admitting: Obstetrics and Gynecology

## 2023-12-09 VITALS — BP 146/81 | HR 60

## 2023-12-09 DIAGNOSIS — R350 Frequency of micturition: Secondary | ICD-10-CM

## 2023-12-09 DIAGNOSIS — M5416 Radiculopathy, lumbar region: Secondary | ICD-10-CM

## 2023-12-09 DIAGNOSIS — M62838 Other muscle spasm: Secondary | ICD-10-CM | POA: Diagnosis not present

## 2023-12-09 LAB — POCT URINALYSIS DIP (CLINITEK)
Bilirubin, UA: NEGATIVE
Blood, UA: NEGATIVE
Glucose, UA: NEGATIVE mg/dL
Ketones, POC UA: NEGATIVE mg/dL
Leukocytes, UA: NEGATIVE
Nitrite, UA: NEGATIVE
POC PROTEIN,UA: NEGATIVE
Spec Grav, UA: 1.02 (ref 1.010–1.025)
Urobilinogen, UA: 0.2 U/dL
pH, UA: 7.5 (ref 5.0–8.0)

## 2023-12-09 NOTE — Progress Notes (Signed)
 [facility] Return Visit  SUBJECTIVE  History of Present Illness: Sheryle Vice Oyola is a 63 y.o. female seen in follow-up. She is s/p Anterior repair, sacrospinous ligament fixation, cystoscopy on 06/07/23.   Pt went to pelvic PT on 8/7. Later that day, she had severe pain in dimple of buttock, down her back, into her left buttock and down her foot. Never had pain like that before hand. She went to her PCP and sports medicine that ordered imaging.   Pt is very upset and stated that she tried to contact the PT office without a response. She sent us  a message after her MRI results returned on 8/25. She was given prednisone  and zanaflex  and has not taken the zanaflex . She was recommended to have an epidural or foraminal injection but she does not want that. Appears that she was also recommended referral to neurosurgery. She does not report urinary incontinence but has urinary frequency and feels like she is urinating every 45 min.   MRI Pelvis (12/04/23) IMPRESSION: 1. Minimal joint space narrowing of the bilateral hips. 2. No discrete soft tissue abnormality. 3. Please refer to same day MRI of the lumbar spine for description of findings in the lumbar spine.  MR Lumbar spine (12/04/23) IMPRESSION: 1. 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 2. 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 Correlate for left L5 radiculopathy. The disc extrusion also results in severe left L4-L5 neural foraminal narrowing, encroaching upon the exiting left L4 nerve root. Correlate for left L4 radiculopathy. 3. No significant spinal canal or foraminal stenosis at the remaining levels.  Past Medical History: Patient  has a past medical history of Allergic rhinitis due to other allergen, Allergy, Anemia, Arthritis, Asthma,  Chicken pox, Cholelithiasis, CTS (carpal tunnel syndrome), Diabetes mellitus without complication (HCC), Frequent urination, GERD (gastroesophageal reflux disease), Headache(784.0), Migraine, PONV (postoperative nausea and vomiting), Problems related to high-risk sexual behavior, Sleep apnea, and Symptomatic menopausal or female climacteric states.   Past Surgical History: She  has a past surgical history that includes Tubal ligation; Laparoscopic hysterectomy (04/13/2006); Laser ablation condyloma cervical / vulvar; Cholecystectomy (05/19/2011); Percutaneous pinning (01/22/2012); Wisdom tooth extraction; Carpal tunnel release (Right, 04/13/2013); Anterior and posterior repair with sacrospinous fixation (N/A, 06/07/2023); and Cystoscopy (N/A, 06/07/2023).   Medications: She has a current medication list which includes the following prescription(s): acetaminophen , bee pollen, berberine chloride, biotin, cetirizine , cinnamon, epinephrine , oscillococcinum, ibuprofen , levalbuterol , NON FORMULARY, NON FORMULARY, NON FORMULARY, olive leaf, prednisone , sumatriptan , tizanidine , and zonisamide .   Allergies: Patient is allergic to iodine, shellfish allergy, fish oil, other, and latex.   Social History: Patient  reports that she has never smoked. She has never used smokeless tobacco. She reports current alcohol use. She reports that she does not use drugs.     OBJECTIVE     Physical Exam: Vitals:   12/09/23 0822  BP: (!) 146/81  Pulse: 60   Gen: No apparent distress, A&O x 3.  Detailed Urogynecologic Evaluation:  Normal external genitalia. On speculum, normal vaginal mucosa. On bimanual, no masses present.  Increased tenderness on palpation of pelvic floor muscles on the left, increased tension noted.    Results for orders placed or performed in visit on 12/09/23  POCT URINALYSIS DIP (CLINITEK)   Collection Time: 12/09/23 11:43 AM  Result Value Ref Range   Color, UA yellow yellow  Clarity, UA  clear clear   Glucose, UA negative negative mg/dL   Bilirubin, UA negative negative   Ketones, POC UA negative negative mg/dL   Spec Grav, UA 8.979 8.989 - 1.025   Blood, UA negative negative   pH, UA 7.5 5.0 - 8.0   POC PROTEIN,UA negative negative, trace   Urobilinogen, UA 0.2 0.2 or 1.0 E.U./dL   Nitrite, UA Negative Negative   Leukocytes, UA Negative Negative    ASSESSMENT AND PLAN    Ms. Pesce is a 63 y.o. with:  1. Lumbar radiculopathy   2. Levator spasm    - Pt is very upset at today's visit. Discussed with patient that she does have pelvic floor muscle spasm but this is likely related to the acute nature of her lumbar radiculopathy, since it is only on the left side. We discussed that disc instability was likely present prior to physical therapy. Offered to message pelvic PT provider and patient declined.  - Pt was also upset because she said she did not have a post operative visit with us  after the surgery, and she was supposed to have one in July that got cancelled. I reviewed the visits with her because she did have a post operative voiding trial with the nurse and two post operative visits with me on 3/18 and 4/15. Her appointment in July that she was referring to was with pelvic PT. I reminded her that our office is not the same as pelvic PT.  - Usually pelvic floor muscle spasm treatment includes muscle relaxants and as she was already prescribed zanaflex , encouraged her to start taking this.  - POC urine negative today for infection. We discussed that bladder urgency/ frequency can be exacerbated by pelvic floor muscle tension.  - Encouraged follow up with PCP and sports medicine  Rosaline LOISE Caper, MD

## 2023-12-10 DIAGNOSIS — M9903 Segmental and somatic dysfunction of lumbar region: Secondary | ICD-10-CM | POA: Diagnosis not present

## 2023-12-10 DIAGNOSIS — M9902 Segmental and somatic dysfunction of thoracic region: Secondary | ICD-10-CM | POA: Diagnosis not present

## 2023-12-10 DIAGNOSIS — M9904 Segmental and somatic dysfunction of sacral region: Secondary | ICD-10-CM | POA: Diagnosis not present

## 2023-12-10 DIAGNOSIS — M5127 Other intervertebral disc displacement, lumbosacral region: Secondary | ICD-10-CM | POA: Diagnosis not present

## 2023-12-15 DIAGNOSIS — M9903 Segmental and somatic dysfunction of lumbar region: Secondary | ICD-10-CM | POA: Diagnosis not present

## 2023-12-15 DIAGNOSIS — M5127 Other intervertebral disc displacement, lumbosacral region: Secondary | ICD-10-CM | POA: Diagnosis not present

## 2023-12-15 DIAGNOSIS — M9904 Segmental and somatic dysfunction of sacral region: Secondary | ICD-10-CM | POA: Diagnosis not present

## 2023-12-15 DIAGNOSIS — M9902 Segmental and somatic dysfunction of thoracic region: Secondary | ICD-10-CM | POA: Diagnosis not present

## 2023-12-17 DIAGNOSIS — M9904 Segmental and somatic dysfunction of sacral region: Secondary | ICD-10-CM | POA: Diagnosis not present

## 2023-12-17 DIAGNOSIS — M5127 Other intervertebral disc displacement, lumbosacral region: Secondary | ICD-10-CM | POA: Diagnosis not present

## 2023-12-17 DIAGNOSIS — M9902 Segmental and somatic dysfunction of thoracic region: Secondary | ICD-10-CM | POA: Diagnosis not present

## 2023-12-17 DIAGNOSIS — M9903 Segmental and somatic dysfunction of lumbar region: Secondary | ICD-10-CM | POA: Diagnosis not present

## 2023-12-22 DIAGNOSIS — M5127 Other intervertebral disc displacement, lumbosacral region: Secondary | ICD-10-CM | POA: Diagnosis not present

## 2023-12-22 DIAGNOSIS — M9902 Segmental and somatic dysfunction of thoracic region: Secondary | ICD-10-CM | POA: Diagnosis not present

## 2023-12-22 DIAGNOSIS — M9903 Segmental and somatic dysfunction of lumbar region: Secondary | ICD-10-CM | POA: Diagnosis not present

## 2023-12-22 DIAGNOSIS — M9904 Segmental and somatic dysfunction of sacral region: Secondary | ICD-10-CM | POA: Diagnosis not present

## 2023-12-24 DIAGNOSIS — M9903 Segmental and somatic dysfunction of lumbar region: Secondary | ICD-10-CM | POA: Diagnosis not present

## 2023-12-24 DIAGNOSIS — M9902 Segmental and somatic dysfunction of thoracic region: Secondary | ICD-10-CM | POA: Diagnosis not present

## 2023-12-24 DIAGNOSIS — M9904 Segmental and somatic dysfunction of sacral region: Secondary | ICD-10-CM | POA: Diagnosis not present

## 2023-12-24 DIAGNOSIS — M5127 Other intervertebral disc displacement, lumbosacral region: Secondary | ICD-10-CM | POA: Diagnosis not present

## 2023-12-28 DIAGNOSIS — M5416 Radiculopathy, lumbar region: Secondary | ICD-10-CM | POA: Diagnosis not present

## 2023-12-29 DIAGNOSIS — M9903 Segmental and somatic dysfunction of lumbar region: Secondary | ICD-10-CM | POA: Diagnosis not present

## 2023-12-29 DIAGNOSIS — M9902 Segmental and somatic dysfunction of thoracic region: Secondary | ICD-10-CM | POA: Diagnosis not present

## 2023-12-29 DIAGNOSIS — M5127 Other intervertebral disc displacement, lumbosacral region: Secondary | ICD-10-CM | POA: Diagnosis not present

## 2023-12-29 DIAGNOSIS — M9904 Segmental and somatic dysfunction of sacral region: Secondary | ICD-10-CM | POA: Diagnosis not present

## 2023-12-29 DIAGNOSIS — M25562 Pain in left knee: Secondary | ICD-10-CM | POA: Diagnosis not present

## 2023-12-31 DIAGNOSIS — M47816 Spondylosis without myelopathy or radiculopathy, lumbar region: Secondary | ICD-10-CM | POA: Insufficient documentation

## 2023-12-31 DIAGNOSIS — M9904 Segmental and somatic dysfunction of sacral region: Secondary | ICD-10-CM | POA: Diagnosis not present

## 2023-12-31 DIAGNOSIS — M9903 Segmental and somatic dysfunction of lumbar region: Secondary | ICD-10-CM | POA: Diagnosis not present

## 2023-12-31 DIAGNOSIS — M9902 Segmental and somatic dysfunction of thoracic region: Secondary | ICD-10-CM | POA: Diagnosis not present

## 2023-12-31 DIAGNOSIS — M5127 Other intervertebral disc displacement, lumbosacral region: Secondary | ICD-10-CM | POA: Diagnosis not present

## 2024-01-05 DIAGNOSIS — M9904 Segmental and somatic dysfunction of sacral region: Secondary | ICD-10-CM | POA: Diagnosis not present

## 2024-01-05 DIAGNOSIS — M5127 Other intervertebral disc displacement, lumbosacral region: Secondary | ICD-10-CM | POA: Diagnosis not present

## 2024-01-05 DIAGNOSIS — M9903 Segmental and somatic dysfunction of lumbar region: Secondary | ICD-10-CM | POA: Diagnosis not present

## 2024-01-05 DIAGNOSIS — M9902 Segmental and somatic dysfunction of thoracic region: Secondary | ICD-10-CM | POA: Diagnosis not present

## 2024-01-07 DIAGNOSIS — M9902 Segmental and somatic dysfunction of thoracic region: Secondary | ICD-10-CM | POA: Diagnosis not present

## 2024-01-07 DIAGNOSIS — M9903 Segmental and somatic dysfunction of lumbar region: Secondary | ICD-10-CM | POA: Diagnosis not present

## 2024-01-07 DIAGNOSIS — M9904 Segmental and somatic dysfunction of sacral region: Secondary | ICD-10-CM | POA: Diagnosis not present

## 2024-01-07 DIAGNOSIS — M5127 Other intervertebral disc displacement, lumbosacral region: Secondary | ICD-10-CM | POA: Diagnosis not present

## 2024-01-12 DIAGNOSIS — M9902 Segmental and somatic dysfunction of thoracic region: Secondary | ICD-10-CM | POA: Diagnosis not present

## 2024-01-12 DIAGNOSIS — M5127 Other intervertebral disc displacement, lumbosacral region: Secondary | ICD-10-CM | POA: Diagnosis not present

## 2024-01-12 DIAGNOSIS — M9904 Segmental and somatic dysfunction of sacral region: Secondary | ICD-10-CM | POA: Diagnosis not present

## 2024-01-12 DIAGNOSIS — M9903 Segmental and somatic dysfunction of lumbar region: Secondary | ICD-10-CM | POA: Diagnosis not present

## 2024-01-14 DIAGNOSIS — M9904 Segmental and somatic dysfunction of sacral region: Secondary | ICD-10-CM | POA: Diagnosis not present

## 2024-01-14 DIAGNOSIS — M9903 Segmental and somatic dysfunction of lumbar region: Secondary | ICD-10-CM | POA: Diagnosis not present

## 2024-01-14 DIAGNOSIS — M5127 Other intervertebral disc displacement, lumbosacral region: Secondary | ICD-10-CM | POA: Diagnosis not present

## 2024-01-14 DIAGNOSIS — M9902 Segmental and somatic dysfunction of thoracic region: Secondary | ICD-10-CM | POA: Diagnosis not present

## 2024-01-19 DIAGNOSIS — M5127 Other intervertebral disc displacement, lumbosacral region: Secondary | ICD-10-CM | POA: Diagnosis not present

## 2024-01-19 DIAGNOSIS — M9903 Segmental and somatic dysfunction of lumbar region: Secondary | ICD-10-CM | POA: Diagnosis not present

## 2024-01-19 DIAGNOSIS — M9902 Segmental and somatic dysfunction of thoracic region: Secondary | ICD-10-CM | POA: Diagnosis not present

## 2024-01-19 DIAGNOSIS — M9904 Segmental and somatic dysfunction of sacral region: Secondary | ICD-10-CM | POA: Diagnosis not present

## 2024-01-20 ENCOUNTER — Ambulatory Visit: Admitting: Podiatry

## 2024-01-20 ENCOUNTER — Ambulatory Visit (INDEPENDENT_AMBULATORY_CARE_PROVIDER_SITE_OTHER)

## 2024-01-20 ENCOUNTER — Encounter: Payer: Self-pay | Admitting: Podiatry

## 2024-01-20 DIAGNOSIS — M722 Plantar fascial fibromatosis: Secondary | ICD-10-CM | POA: Diagnosis not present

## 2024-01-20 DIAGNOSIS — M79672 Pain in left foot: Secondary | ICD-10-CM

## 2024-01-20 MED ORDER — TRIAMCINOLONE ACETONIDE 10 MG/ML IJ SUSP
10.0000 mg | Freq: Once | INTRAMUSCULAR | Status: AC
  Administered 2024-01-20: 10 mg via INTRA_ARTICULAR

## 2024-01-20 MED ORDER — DICLOFENAC SODIUM 75 MG PO TBEC: 75.0000 mg | DELAYED_RELEASE_TABLET | Freq: Two times a day (BID) | ORAL | 2 refills | Status: DC

## 2024-01-20 NOTE — Progress Notes (Signed)
 Subjective:   Patient ID: Natalie Vazquez, female   DOB: 63 y.o.   MRN: [id number]   HPI Patient presents with a lot of pain in the plantar aspect of the left heel x 3 weeks.  Hurts when walking and when getting up in the morning or after sitting   ROS      Objective:  Physical Exam  Neurovascular status intact exquisite discomfort plantar aspect left heel at the insertional point tendon calcaneus fluid buildup noted around the medial band.     Assessment:  Plantar fasciitis left     Plan:  H&P reviewed sterile prep injected the fascia at insertion 3 mg Kenalog  5 mg Xylocaine  applied fascial brace to lift up the arch and take stress off the heel placed on diclofenac  75 mg twice daily instructed on shoe gear modifications reappoint to recheck  X-rays indicate spur no indication stress fracture arthritis

## 2024-01-21 DIAGNOSIS — M9903 Segmental and somatic dysfunction of lumbar region: Secondary | ICD-10-CM | POA: Diagnosis not present

## 2024-01-21 DIAGNOSIS — M9904 Segmental and somatic dysfunction of sacral region: Secondary | ICD-10-CM | POA: Diagnosis not present

## 2024-01-21 DIAGNOSIS — M5127 Other intervertebral disc displacement, lumbosacral region: Secondary | ICD-10-CM | POA: Diagnosis not present

## 2024-01-21 DIAGNOSIS — M9902 Segmental and somatic dysfunction of thoracic region: Secondary | ICD-10-CM | POA: Diagnosis not present

## 2024-01-26 DIAGNOSIS — M9903 Segmental and somatic dysfunction of lumbar region: Secondary | ICD-10-CM | POA: Diagnosis not present

## 2024-01-26 DIAGNOSIS — M9902 Segmental and somatic dysfunction of thoracic region: Secondary | ICD-10-CM | POA: Diagnosis not present

## 2024-01-26 DIAGNOSIS — M9904 Segmental and somatic dysfunction of sacral region: Secondary | ICD-10-CM | POA: Diagnosis not present

## 2024-01-26 DIAGNOSIS — M5127 Other intervertebral disc displacement, lumbosacral region: Secondary | ICD-10-CM | POA: Diagnosis not present

## 2024-02-02 DIAGNOSIS — M9904 Segmental and somatic dysfunction of sacral region: Secondary | ICD-10-CM | POA: Diagnosis not present

## 2024-02-02 DIAGNOSIS — M9902 Segmental and somatic dysfunction of thoracic region: Secondary | ICD-10-CM | POA: Diagnosis not present

## 2024-02-02 DIAGNOSIS — M9903 Segmental and somatic dysfunction of lumbar region: Secondary | ICD-10-CM | POA: Diagnosis not present

## 2024-02-02 DIAGNOSIS — M5127 Other intervertebral disc displacement, lumbosacral region: Secondary | ICD-10-CM | POA: Diagnosis not present

## 2024-02-03 ENCOUNTER — Ambulatory Visit: Admitting: Podiatry

## 2024-02-03 ENCOUNTER — Encounter: Payer: Self-pay | Admitting: Podiatry

## 2024-02-03 VITALS — Ht 65.5 in | Wt 206.0 lb

## 2024-02-03 DIAGNOSIS — M722 Plantar fascial fibromatosis: Secondary | ICD-10-CM

## 2024-02-03 NOTE — Progress Notes (Signed)
 Subjective:   Patient ID: Natalie Vazquez, female   DOB: 63 y.o.   MRN: [id number]   HPI States that she is feeling quite a bit better from having heel pain left.  States still mildly tender but much better   ROS      Objective:  Physical Exam  Neuro vascular status intact with significant diminishment of discomfort plantar heel mild discomfort still noted but much better than previous     Assessment:  Acute plantar fasciitis improved     Plan:  H&P reviewed discussed continued conservative care stretching exercises and discussed possible orthotics in future.  At this point I went ahead and applied sterile dressing reappoint to recheck     Patient

## 2024-02-04 DIAGNOSIS — M9903 Segmental and somatic dysfunction of lumbar region: Secondary | ICD-10-CM | POA: Diagnosis not present

## 2024-02-04 DIAGNOSIS — M9904 Segmental and somatic dysfunction of sacral region: Secondary | ICD-10-CM | POA: Diagnosis not present

## 2024-02-04 DIAGNOSIS — M9902 Segmental and somatic dysfunction of thoracic region: Secondary | ICD-10-CM | POA: Diagnosis not present

## 2024-02-04 DIAGNOSIS — M5127 Other intervertebral disc displacement, lumbosacral region: Secondary | ICD-10-CM | POA: Diagnosis not present

## 2024-02-09 DIAGNOSIS — M9902 Segmental and somatic dysfunction of thoracic region: Secondary | ICD-10-CM | POA: Diagnosis not present

## 2024-02-09 DIAGNOSIS — M5127 Other intervertebral disc displacement, lumbosacral region: Secondary | ICD-10-CM | POA: Diagnosis not present

## 2024-02-09 DIAGNOSIS — M9904 Segmental and somatic dysfunction of sacral region: Secondary | ICD-10-CM | POA: Diagnosis not present

## 2024-02-09 DIAGNOSIS — M9903 Segmental and somatic dysfunction of lumbar region: Secondary | ICD-10-CM | POA: Diagnosis not present

## 2024-02-11 DIAGNOSIS — M9903 Segmental and somatic dysfunction of lumbar region: Secondary | ICD-10-CM | POA: Diagnosis not present

## 2024-02-11 DIAGNOSIS — M5127 Other intervertebral disc displacement, lumbosacral region: Secondary | ICD-10-CM | POA: Diagnosis not present

## 2024-02-11 DIAGNOSIS — M9902 Segmental and somatic dysfunction of thoracic region: Secondary | ICD-10-CM | POA: Diagnosis not present

## 2024-02-11 DIAGNOSIS — M9904 Segmental and somatic dysfunction of sacral region: Secondary | ICD-10-CM | POA: Diagnosis not present

## 2024-02-16 DIAGNOSIS — M9902 Segmental and somatic dysfunction of thoracic region: Secondary | ICD-10-CM | POA: Diagnosis not present

## 2024-02-16 DIAGNOSIS — M5127 Other intervertebral disc displacement, lumbosacral region: Secondary | ICD-10-CM | POA: Diagnosis not present

## 2024-02-16 DIAGNOSIS — M25562 Pain in left knee: Secondary | ICD-10-CM | POA: Diagnosis not present

## 2024-02-16 DIAGNOSIS — M9904 Segmental and somatic dysfunction of sacral region: Secondary | ICD-10-CM | POA: Diagnosis not present

## 2024-02-16 DIAGNOSIS — M9903 Segmental and somatic dysfunction of lumbar region: Secondary | ICD-10-CM | POA: Diagnosis not present

## 2024-02-18 DIAGNOSIS — M5127 Other intervertebral disc displacement, lumbosacral region: Secondary | ICD-10-CM | POA: Diagnosis not present

## 2024-02-18 DIAGNOSIS — M9903 Segmental and somatic dysfunction of lumbar region: Secondary | ICD-10-CM | POA: Diagnosis not present

## 2024-02-18 DIAGNOSIS — M9904 Segmental and somatic dysfunction of sacral region: Secondary | ICD-10-CM | POA: Diagnosis not present

## 2024-02-18 DIAGNOSIS — M9902 Segmental and somatic dysfunction of thoracic region: Secondary | ICD-10-CM | POA: Diagnosis not present

## 2024-03-05 DIAGNOSIS — M25562 Pain in left knee: Secondary | ICD-10-CM | POA: Insufficient documentation

## 2024-03-05 DIAGNOSIS — M25362 Other instability, left knee: Secondary | ICD-10-CM | POA: Insufficient documentation

## 2024-04-24 ENCOUNTER — Encounter: Payer: Self-pay | Admitting: *Deleted

## 2024-05-17 ENCOUNTER — Ambulatory Visit: Payer: Federal, State, Local not specified - PPO | Admitting: Physician Assistant

## 2024-05-17 ENCOUNTER — Ambulatory Visit: Payer: Self-pay | Admitting: Physician Assistant

## 2024-05-17 ENCOUNTER — Encounter: Payer: Self-pay | Admitting: Physician Assistant

## 2024-05-17 VITALS — BP 138/82 | HR 68 | Temp 97.5°F | Ht 66.54 in | Wt 217.6 lb

## 2024-05-17 DIAGNOSIS — Z0000 Encounter for general adult medical examination without abnormal findings: Secondary | ICD-10-CM

## 2024-05-17 DIAGNOSIS — R7303 Prediabetes: Secondary | ICD-10-CM

## 2024-05-17 DIAGNOSIS — R030 Elevated blood-pressure reading, without diagnosis of hypertension: Secondary | ICD-10-CM

## 2024-05-17 LAB — LIPID PANEL
Cholesterol: 145 mg/dL (ref 28–200)
HDL: 69.6 mg/dL
LDL Cholesterol: 62 mg/dL (ref 10–99)
NonHDL: 75.41
Total CHOL/HDL Ratio: 2
Triglycerides: 69 mg/dL (ref 10.0–149.0)
VLDL: 13.8 mg/dL (ref 0.0–40.0)

## 2024-05-17 LAB — COMPREHENSIVE METABOLIC PANEL WITH GFR
ALT: 14 U/L (ref 3–35)
AST: 20 U/L (ref 5–37)
Albumin: 4.2 g/dL (ref 3.5–5.2)
Alkaline Phosphatase: 51 U/L (ref 39–117)
BUN: 9 mg/dL (ref 6–23)
CO2: 26 meq/L (ref 19–32)
Calcium: 9.3 mg/dL (ref 8.4–10.5)
Chloride: 105 meq/L (ref 96–112)
Creatinine, Ser: 0.66 mg/dL (ref 0.40–1.20)
GFR: 94.35 mL/min
Glucose, Bld: 99 mg/dL (ref 70–99)
Potassium: 3.7 meq/L (ref 3.5–5.1)
Sodium: 139 meq/L (ref 135–145)
Total Bilirubin: 0.3 mg/dL (ref 0.2–1.2)
Total Protein: 7.6 g/dL (ref 6.0–8.3)

## 2024-05-17 LAB — CBC WITH DIFFERENTIAL/PLATELET
Basophils Absolute: 0 10*3/uL (ref 0.0–0.1)
Basophils Relative: 0.4 % (ref 0.0–3.0)
Eosinophils Absolute: 0.1 10*3/uL (ref 0.0–0.7)
Eosinophils Relative: 2.1 % (ref 0.0–5.0)
HCT: 39.1 % (ref 36.0–46.0)
Hemoglobin: 12.7 g/dL (ref 12.0–15.0)
Lymphocytes Relative: 51.8 % — ABNORMAL HIGH (ref 12.0–46.0)
Lymphs Abs: 2.4 10*3/uL (ref 0.7–4.0)
MCHC: 32.5 g/dL (ref 30.0–36.0)
MCV: 79 fl (ref 78.0–100.0)
Monocytes Absolute: 0.4 10*3/uL (ref 0.1–1.0)
Monocytes Relative: 7.4 % (ref 3.0–12.0)
Neutro Abs: 1.8 10*3/uL (ref 1.4–7.7)
Neutrophils Relative %: 38.3 % — ABNORMAL LOW (ref 43.0–77.0)
Platelets: 194 10*3/uL (ref 150.0–400.0)
RBC: 4.94 Mil/uL (ref 3.87–5.11)
RDW: 14.7 % (ref 11.5–15.5)
WBC: 4.7 10*3/uL (ref 4.0–10.5)

## 2024-05-17 LAB — TSH: TSH: 1.3 u[IU]/mL (ref 0.35–5.50)

## 2024-05-17 LAB — MICROALBUMIN / CREATININE URINE RATIO
Creatinine,U: 154.3 mg/dL
Microalb Creat Ratio: 4.9 mg/g (ref 0.0–30.0)
Microalb, Ur: 0.8 mg/dL (ref 0.7–1.9)

## 2024-05-17 LAB — HEMOGLOBIN A1C: Hgb A1c MFr Bld: 6.5 % (ref 4.6–6.5)

## 2024-05-17 NOTE — Patient Instructions (Signed)
 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 You're a blessing to those around you! See you back soon.

## 2024-05-17 NOTE — Progress Notes (Signed)
 "   Patient ID: Natalie Vazquez, female    DOB: [birth year], 64 y.o.   MRN: [id number]   Assessment & Plan:  Annual physical exam -     CBC with Differential/Platelet -     Comprehensive metabolic panel with GFR -     Hemoglobin A1c -     Lipid panel -     TSH  Pre-diabetes -     Microalbumin / creatinine urine ratio  Elevated blood pressure reading -     Microalbumin / creatinine urine ratio      Assessment and Plan Assessment & Plan Prediabetes Requiring monitoring of blood glucose levels and kidney function. - Ordered CBC, CMP, A1c, lipid panel, TSH, and microalbumin to assess kidney function.  Elevated blood pressure Noted during the visit. No current antihypertensive medication. Discussed the importance of monitoring blood pressure and kidney function due to potential early signs of kidney issues. - Ordered microalbumin to assess kidney function. - Rechecked blood pressure before leaving the clinic.  Sleep disturbance Likely related to stress and caregiving responsibilities. No pharmacological intervention recommended due to caregiving responsibilities. - Discussed potential temporary nature of sleep disturbance due to current life circumstances.  General Health Maintenance Routine health maintenance discussed, including cancer screenings and lifestyle modifications. - Schedule mammogram. - Will review colonoscopy history and determine appropriate follow-up interval. - Encouraged increased water intake. - Encouraged regular self-breast exams.  Age-appropriate screening and counseling performed today. Will check labs and call with results. Preventive measures discussed and printed in AVS for patient.   Patient Counseling: [x]   Nutrition: Stressed importance of moderation in sodium/caffeine intake, saturated fat and cholesterol, caloric balance, sufficient intake of fresh fruits, vegetables, and fiber.  [x]   Stressed the importance of regular exercise.   []   Substance  Abuse: Discussed cessation/primary prevention of tobacco, alcohol, or other drug use; driving or other dangerous activities under the influence; availability of treatment for abuse.   [x]   Injury prevention: Discussed safety belts, safety helmets, smoke detector, smoking near bedding or upholstery.   []   Sexuality: Discussed sexually transmitted diseases, partner selection, use of condoms, avoidance of unintended pregnancy  and contraceptive alternatives.   [x]   Dental health: Discussed importance of regular tooth brushing, flossing, and dental visits.  [x]   Health maintenance and immunizations reviewed. Please refer to Health maintenance section.          Return in about 6 months (around 11/14/2024) for recheck/follow-up.    Subjective:    Chief Complaint  Patient presents with   Annual Exam    Pt in office for annual CPE and fasting labs;     HPI Discussed the use of AI scribe software for clinical note transcription with the patient, who gave verbal consent to proceed.  History of Present Illness Natalie Vazquez is a 64 year old female who presents for her annual physical exam.  She experiences difficulty sleeping, characterized by trouble falling asleep and waking up in the middle of the night. She attributes these issues to the demands of caring for her four grandchildren, aged 71, seven, almost two, and eight months, which requires her to wake up early. Her sister, who lives nearby and is in remission from cancer, helps with the children, which she finds beneficial for both of them.  She reports that she is not on any medication for hypertension and that her blood pressure is usually not that high. She notes that her blood pressure is usually not that high. She  has a history of prediabetes and is due for blood work today, including CBC, CMP, A1c, lipid panel, and TSH.  She has a history of a partial hysterectomy, retaining her ovaries, and is up to date with her colon  cancer screening, having had a colonoscopy in 2023 where a polyp was found. She is unsure of the follow-up interval but recalls no family history of colon cancer, though there is a history of stomach and breast cancer in her family. She is not experiencing any vaginal bleeding or changes post-hysterectomy. She had bladder prolapse surgery, which she feels was not entirely successful but is manageable.  She does not smoke, drink alcohol, or use recreational drugs. She is currently working and caring for her grandchildren, which impacts her daily routine and sleep.     Past Medical History:  Diagnosis Date   Allergic rhinitis due to other allergen    Allergy    seasonal   Anemia    Arthritis    Asthma    mild, intermittent   Chicken pox    Cholelithiasis    CTS (carpal tunnel syndrome)    Diabetes mellitus without complication (HCC)    Frequent urination    GERD (gastroesophageal reflux disease)    Headache(784.0)    Migraine    PONV (postoperative nausea and vomiting)    difficulty waking up   Problems related to high-risk sexual behavior    Unprotected sex   Sleep apnea    No Cpap   Symptomatic menopausal or female climacteric states     Past Surgical History:  Procedure Laterality Date   ABDOMINAL HYSTERECTOMY     ANTERIOR AND POSTERIOR REPAIR WITH SACROSPINOUS FIXATION N/A 06/07/2023   Procedure: ANTERIOR REPAIR WITH SACROSPINOUS FIXATION;  Surgeon: Marilynne Rosaline SAILOR, MD;  Location: [facility] OR;  Service: Gynecology;  Laterality: N/A;  Total time requested is 1.5 hrs [facility]   CARPAL TUNNEL RELEASE Right 04/13/2013   CHOLECYSTECTOMY  05/19/2011   Procedure: LAPAROSCOPIC CHOLECYSTECTOMY;  Surgeon: Dann FORBES Hummer, MD;  Location: [facility] OR;  Service: General;  Laterality: N/A;   CYSTOSCOPY N/A 06/07/2023   Procedure: CYSTOSCOPY;  Surgeon: Marilynne Rosaline SAILOR, MD;  Location: [facility] OR;  Service: Gynecology;  Laterality: N/A;   EYE SURGERY     FRACTURE SURGERY     LAPAROSCOPIC  HYSTERECTOMY  04/13/2006   partial, ovaries remain   LASER ABLATION CONDYLOMA CERVICAL / VULVAR     PERCUTANEOUS PINNING  01/22/2012   Procedure: PERCUTANEOUS PINNING EXTREMITY;  Surgeon: Oneil JAYSON Herald, MD;  Location: [facility] OR;  Service: Orthopedics;  Laterality: Right;  Closed Reduction and Pinning right 5th Metacarpal Fracture   TUBAL LIGATION     WISDOM TOOTH EXTRACTION      Family History  Problem Relation Age of Onset   Diabetes Mother        Deceased   Hypertension Mother    Anemia Mother    Hyperlipidemia Mother    Arthritis/Rheumatoid Mother    Migraines Mother    CVA Mother    Alcohol abuse Mother    Stroke Mother    Arthritis Mother    Miscarriages / Stillbirths Mother    Obesity Mother    Hypertension Father        Deceased   Stroke Father    Diabetes Father        Legs Amputated   Heart disease Father    Arthritis Father    Cancer Father    Stomach cancer Sister  Depression Sister    Cancer Sister    Breast cancer Sister    COPD Sister    Uterine cancer Sister        or Ovarian   Arthritis Sister    Asthma Sister        x2   Diabetes Sister    Cancer Sister    Hypertension Sister    Stroke Sister    Arthritis Sister    Obesity Sister    Hypertension Sister    Diabetes Sister    Heart disease Sister    Cancer Sister    Hypertension Sister    Sleep apnea Brother    Alcohol abuse Brother    Drug abuse Brother    COPD Brother    Alcohol abuse Brother    Drug abuse Brother    Sudden death Brother        SIDS   Birth defects Brother    Early death Brother    Hypertension Brother    Asthma Brother    Asthma Daughter        x2   Allergies Daughter    Miscarriages / Stillbirths Daughter    Atrial fibrillation Daughter    Anxiety disorder Daughter    Depression Daughter    Allergies Son    Heart attack Maternal Aunt    Alcohol abuse Maternal Aunt    Miscarriages / Stillbirths Maternal Aunt    Obesity Maternal Aunt    Migraines Maternal Aunt     Alcohol abuse Maternal Aunt    Miscarriages / Stillbirths Maternal Aunt    Obesity Maternal Aunt    Asthma Maternal Aunt    Miscarriages / Stillbirths Maternal Aunt    Diabetes Maternal Uncle    Alcohol abuse Maternal Uncle    Asthma Maternal Uncle    Sickle cell trait Other        All Children   Bladder Cancer Neg Hx     Social History[1]   Allergies[2]  Review of Systems NEGATIVE UNLESS OTHERWISE INDICATED IN HPI      Objective:     BP 138/82 (BP Location: Left Arm, Patient Position: Sitting, Cuff Size: Large)   Pulse 68   Temp (!) 97.5 F (36.4 C) (Temporal)   Ht 5' 6.54 (1.69 m)   Wt 217 lb 9.6 oz (98.7 kg)   SpO2 98%   BMI 34.56 kg/m   Wt Readings from Last 3 Encounters:  05/17/24 217 lb 9.6 oz (98.7 kg)  02/03/24 206 lb (93.4 kg)  11/30/23 206 lb (93.4 kg)    BP Readings from Last 3 Encounters:  05/17/24 138/82  12/09/23 (!) 146/81  11/30/23 112/70     Physical Exam Vitals and nursing note reviewed.  Constitutional:      Appearance: Normal appearance. She is obese. She is not toxic-appearing.  HENT:     Head: Normocephalic and atraumatic.     Right Ear: Tympanic membrane, ear canal and external ear normal.     Left Ear: Tympanic membrane, ear canal and external ear normal.     Nose: Nose normal.     Mouth/Throat:     Mouth: Mucous membranes are moist.  Eyes:     Extraocular Movements: Extraocular movements intact.     Conjunctiva/sclera: Conjunctivae normal.     Pupils: Pupils are equal, round, and reactive to light.  Cardiovascular:     Rate and Rhythm: Normal rate and regular rhythm.     Pulses: Normal pulses.  Heart sounds: Normal heart sounds.  Pulmonary:     Effort: Pulmonary effort is normal.     Breath sounds: Normal breath sounds.  Abdominal:     General: Abdomen is flat. Bowel sounds are normal.     Palpations: Abdomen is soft.  Musculoskeletal:        General: Normal range of motion.     Cervical back: Normal range of  motion and neck supple.  Skin:    General: Skin is warm and dry.  Neurological:     General: No focal deficit present.     Mental Status: She is alert and oriented to person, place, and time.  Psychiatric:        Mood and Affect: Mood normal.        Behavior: Behavior normal.        Thought Content: Thought content normal.        Judgment: Judgment normal.             Kary Sugrue M Quinley Nesler, PA-C     [1]  Social History Tobacco Use   Smoking status: Never   Smokeless tobacco: Never  Vaping Use   Vaping status: Never Used  Substance Use Topics   Alcohol use: Yes    Comment: I drink on occasion.  I dont drink weekly or monthly.   Drug use: Never  [2]  Allergies Allergen Reactions   Iodine Anaphylaxis    Other Reaction(s): agitation   Povidone-Iodine Dermatitis   Shellfish Allergy Anaphylaxis   Fish Oil Other (See Comments)   Other     Grapes - rash    Latex Rash and Dermatitis   "

## 2024-09-15 ENCOUNTER — Ambulatory Visit: Admitting: Physician Assistant

## 2024-11-15 ENCOUNTER — Ambulatory Visit: Admitting: Physician Assistant
